# Patient Record
Sex: Male | Born: 1945 | Race: White | Hispanic: No | Marital: Married | Smoking: Former smoker
Health system: Southern US, Community
[De-identification: ages and names within clinical notes are randomized; demographics above are authoritative.]

## PROBLEM LIST (undated history)

## (undated) DIAGNOSIS — K439 Ventral hernia without obstruction or gangrene: Secondary | ICD-10-CM

## (undated) DIAGNOSIS — I779 Disorder of arteries and arterioles, unspecified: Secondary | ICD-10-CM

## (undated) DIAGNOSIS — R0600 Dyspnea, unspecified: Secondary | ICD-10-CM

## (undated) DIAGNOSIS — I444 Left anterior fascicular block: Secondary | ICD-10-CM

## (undated) DIAGNOSIS — J439 Emphysema, unspecified: Secondary | ICD-10-CM

## (undated) DIAGNOSIS — D62 Acute posthemorrhagic anemia: Secondary | ICD-10-CM

## (undated) DIAGNOSIS — Z7982 Long term (current) use of aspirin: Secondary | ICD-10-CM

## (undated) DIAGNOSIS — Z720 Tobacco use: Secondary | ICD-10-CM

## (undated) DIAGNOSIS — Z8709 Personal history of other diseases of the respiratory system: Secondary | ICD-10-CM

## (undated) DIAGNOSIS — I10 Essential (primary) hypertension: Secondary | ICD-10-CM

## (undated) DIAGNOSIS — E785 Hyperlipidemia, unspecified: Secondary | ICD-10-CM

## (undated) DIAGNOSIS — I6789 Other cerebrovascular disease: Secondary | ICD-10-CM

## (undated) DIAGNOSIS — M4802 Spinal stenosis, cervical region: Secondary | ICD-10-CM

## (undated) DIAGNOSIS — C61 Malignant neoplasm of prostate: Secondary | ICD-10-CM

## (undated) DIAGNOSIS — I2510 Atherosclerotic heart disease of native coronary artery without angina pectoris: Secondary | ICD-10-CM

## (undated) DIAGNOSIS — I6381 Other cerebral infarction due to occlusion or stenosis of small artery: Secondary | ICD-10-CM

## (undated) DIAGNOSIS — B0221 Postherpetic geniculate ganglionitis: Secondary | ICD-10-CM

## (undated) DIAGNOSIS — I700 Atherosclerosis of aorta: Secondary | ICD-10-CM

## (undated) DIAGNOSIS — I5189 Other ill-defined heart diseases: Secondary | ICD-10-CM

## (undated) DIAGNOSIS — J449 Chronic obstructive pulmonary disease, unspecified: Secondary | ICD-10-CM

## (undated) DIAGNOSIS — I272 Pulmonary hypertension, unspecified: Secondary | ICD-10-CM

## (undated) DIAGNOSIS — M47812 Spondylosis without myelopathy or radiculopathy, cervical region: Secondary | ICD-10-CM

## (undated) DIAGNOSIS — R918 Other nonspecific abnormal finding of lung field: Secondary | ICD-10-CM

## (undated) DIAGNOSIS — G459 Transient cerebral ischemic attack, unspecified: Secondary | ICD-10-CM

## (undated) DIAGNOSIS — N281 Cyst of kidney, acquired: Secondary | ICD-10-CM

## (undated) DIAGNOSIS — N529 Male erectile dysfunction, unspecified: Secondary | ICD-10-CM

## (undated) DIAGNOSIS — J9600 Acute respiratory failure, unspecified whether with hypoxia or hypercapnia: Secondary | ICD-10-CM

## (undated) DIAGNOSIS — K4090 Unilateral inguinal hernia, without obstruction or gangrene, not specified as recurrent: Secondary | ICD-10-CM

## (undated) DIAGNOSIS — W19XXXA Unspecified fall, initial encounter: Secondary | ICD-10-CM

## (undated) DIAGNOSIS — R0902 Hypoxemia: Secondary | ICD-10-CM

## (undated) HISTORY — DX: Emphysema, unspecified: J43.9

## (undated) HISTORY — PX: FRACTURE SURGERY: SHX138

## (undated) HISTORY — PX: HERNIA REPAIR: SHX51

## (undated) HISTORY — DX: Hypoxemia: R09.02

---

## 2006-10-18 ENCOUNTER — Ambulatory Visit: Payer: Self-pay | Admitting: General Surgery

## 2006-10-18 HISTORY — PX: COLONOSCOPY: SHX174

## 2009-04-27 HISTORY — PX: PROSTATECTOMY: SHX69

## 2009-04-27 HISTORY — PX: PROSTATE SURGERY: SHX751

## 2011-06-09 ENCOUNTER — Ambulatory Visit: Payer: Self-pay | Admitting: General Practice

## 2015-01-23 ENCOUNTER — Ambulatory Visit
Admission: RE | Admit: 2015-01-23 | Discharge: 2015-01-23 | Disposition: A | Discharge status: Home / Self Care | Payer: 59 | Source: Ambulatory Visit | Attending: Internal Medicine | Admitting: Internal Medicine

## 2015-01-23 ENCOUNTER — Other Ambulatory Visit: Payer: Self-pay | Admitting: Internal Medicine

## 2015-01-23 DIAGNOSIS — M25572 Pain in left ankle and joints of left foot: Secondary | ICD-10-CM | POA: Insufficient documentation

## 2015-11-29 ENCOUNTER — Encounter
Admission: EM | Disposition: A | Discharge status: Home / Self Care | Payer: Self-pay | Source: Home / Self Care | Attending: Surgery

## 2015-11-29 ENCOUNTER — Emergency Department: Payer: Medicare Other | Admitting: Anesthesiology

## 2015-11-29 ENCOUNTER — Inpatient Hospital Stay
Admission: EM | Admit: 2015-11-29 | Discharge: 2015-11-30 | DRG: 494 | Disposition: A | Discharge status: Home / Self Care | Payer: Medicare Other | Attending: Surgery | Admitting: Surgery

## 2015-11-29 ENCOUNTER — Emergency Department: Payer: Medicare Other

## 2015-11-29 DIAGNOSIS — Z8546 Personal history of malignant neoplasm of prostate: Secondary | ICD-10-CM

## 2015-11-29 DIAGNOSIS — Y93K1 Activity, walking an animal: Secondary | ICD-10-CM

## 2015-11-29 DIAGNOSIS — Z7982 Long term (current) use of aspirin: Secondary | ICD-10-CM | POA: Diagnosis not present

## 2015-11-29 DIAGNOSIS — M25562 Pain in left knee: Secondary | ICD-10-CM | POA: Diagnosis present

## 2015-11-29 DIAGNOSIS — S82142A Displaced bicondylar fracture of left tibia, initial encounter for closed fracture: Principal | ICD-10-CM | POA: Diagnosis present

## 2015-11-29 DIAGNOSIS — W010XXA Fall on same level from slipping, tripping and stumbling without subsequent striking against object, initial encounter: Secondary | ICD-10-CM | POA: Diagnosis present

## 2015-11-29 DIAGNOSIS — Y92830 Public park as the place of occurrence of the external cause: Secondary | ICD-10-CM | POA: Diagnosis not present

## 2015-11-29 DIAGNOSIS — F17210 Nicotine dependence, cigarettes, uncomplicated: Secondary | ICD-10-CM | POA: Diagnosis present

## 2015-11-29 DIAGNOSIS — T148XXA Other injury of unspecified body region, initial encounter: Secondary | ICD-10-CM

## 2015-11-29 HISTORY — PX: ORIF TIBIA PLATEAU: SHX2132

## 2015-11-29 HISTORY — DX: Malignant neoplasm of prostate: C61

## 2015-11-29 LAB — BASIC METABOLIC PANEL
Anion gap: 11 (ref 5–15)
BUN: 12 mg/dL (ref 6–20)
CHLORIDE: 98 mmol/L — AB (ref 101–111)
CO2: 22 mmol/L (ref 22–32)
CREATININE: 0.74 mg/dL (ref 0.61–1.24)
Calcium: 9.2 mg/dL (ref 8.9–10.3)
GFR calc Af Amer: >60 mL/min (ref >60)
GFR calc non Af Amer: >60 mL/min (ref >60)
GLUCOSE: 106 mg/dL — AB (ref 65–99)
POTASSIUM: 3.8 mmol/L (ref 3.5–5.1)
Sodium: 131 mmol/L — ABNORMAL LOW (ref 135–145)

## 2015-11-29 LAB — CBC WITH DIFFERENTIAL/PLATELET
Basophils Absolute: 0.1 10*3/uL (ref 0–0.1)
Basophils Relative: 1 %
EOS ABS: 0.2 10*3/uL (ref 0–0.7)
EOS PCT: 2 %
HCT: 43.1 % (ref 40.0–52.0)
HEMOGLOBIN: 15.3 g/dL (ref 13.0–18.0)
LYMPHS ABS: 2.2 10*3/uL (ref 1.0–3.6)
LYMPHS PCT: 22 %
MCH: 34.2 pg — AB (ref 26.0–34.0)
MCHC: 35.6 g/dL (ref 32.0–36.0)
MCV: 96 fL (ref 80.0–100.0)
MONOS PCT: 8 %
Monocytes Absolute: 0.8 10*3/uL (ref 0.2–1.0)
Neutro Abs: 6.7 10*3/uL — ABNORMAL HIGH (ref 1.4–6.5)
Neutrophils Relative %: 67 %
PLATELETS: 174 10*3/uL (ref 150–440)
RBC: 4.49 MIL/uL (ref 4.40–5.90)
RDW: 13.2 % (ref 11.5–14.5)
WBC: 9.9 10*3/uL (ref 3.8–10.6)

## 2015-11-29 LAB — TYPE AND SCREEN
ABO/RH(D): A POS
ANTIBODY SCREEN: NEGATIVE

## 2015-11-29 LAB — PROTIME-INR
INR: 1.07
Prothrombin Time: 13.9 seconds (ref 11.4–15.2)

## 2015-11-29 SURGERY — OPEN REDUCTION INTERNAL FIXATION (ORIF) TIBIAL PLATEAU
Anesthesia: Spinal | Site: Leg Lower | Laterality: Left | Wound class: Clean

## 2015-11-29 MED ORDER — ONDANSETRON HCL 4 MG/2ML IJ SOLN: 4.0000 mg | Freq: Once | INTRAMUSCULAR | Status: DC | PRN

## 2015-11-29 MED ORDER — DOCUSATE SODIUM 100 MG PO CAPS
100.0000 mg | ORAL_CAPSULE | Freq: Two times a day (BID) | ORAL | Status: DC
  Administered 2015-11-30: 100 mg via ORAL
  Filled 2015-11-29 (×2): qty 1

## 2015-11-29 MED ORDER — DIPHENHYDRAMINE HCL 12.5 MG/5ML PO ELIX: 12.5000 mg | ORAL_SOLUTION | ORAL | Status: DC | PRN

## 2015-11-29 MED ORDER — PHENYLEPHRINE HCL 10 MG/ML IJ SOLN
INTRAMUSCULAR | Status: DC | PRN
  Administered 2015-11-29: 100 ug via INTRAVENOUS

## 2015-11-29 MED ORDER — HYDROCODONE-ACETAMINOPHEN 5-325 MG PO TABS
2.0000 | ORAL_TABLET | Freq: Once | ORAL | Status: AC
Start: 2015-11-29 — End: 2015-11-29
  Administered 2015-11-29: 2 via ORAL
  Filled 2015-11-29: qty 2

## 2015-11-29 MED ORDER — KCL IN DEXTROSE-NACL 20-5-0.9 MEQ/L-%-% IV SOLN
INTRAVENOUS | Status: DC
  Administered 2015-11-29: 22:00:00 via INTRAVENOUS
  Filled 2015-11-29 (×5): qty 1000

## 2015-11-29 MED ORDER — CEFAZOLIN SODIUM-DEXTROSE 2-4 GM/100ML-% IV SOLN
2.0000 g | Freq: Four times a day (QID) | INTRAVENOUS | Status: AC
  Administered 2015-11-29 – 2015-11-30 (×3): 2 g via INTRAVENOUS
  Filled 2015-11-29 (×3): qty 100

## 2015-11-29 MED ORDER — BUPIVACAINE-EPINEPHRINE (PF) 0.5% -1:200000 IJ SOLN
INTRAMUSCULAR | Status: DC | PRN
  Administered 2015-11-29: 20 mL via PERINEURAL

## 2015-11-29 MED ORDER — MIDAZOLAM HCL 5 MG/5ML IJ SOLN
INTRAMUSCULAR | Status: DC | PRN
  Administered 2015-11-29: 0.5 mg via INTRAVENOUS
  Administered 2015-11-29: 1 mg via INTRAVENOUS
  Administered 2015-11-29: 0.5 mg via INTRAVENOUS

## 2015-11-29 MED ORDER — METOCLOPRAMIDE HCL 10 MG PO TABS: 5.0000 mg | ORAL_TABLET | Freq: Three times a day (TID) | ORAL | Status: DC | PRN

## 2015-11-29 MED ORDER — METOCLOPRAMIDE HCL 5 MG/ML IJ SOLN: 5.0000 mg | Freq: Three times a day (TID) | INTRAMUSCULAR | Status: DC | PRN

## 2015-11-29 MED ORDER — ACETAMINOPHEN 500 MG PO TABS
1000.0000 mg | ORAL_TABLET | Freq: Four times a day (QID) | ORAL | Status: DC
  Administered 2015-11-29 – 2015-11-30 (×2): 1000 mg via ORAL
  Filled 2015-11-29 (×2): qty 2

## 2015-11-29 MED ORDER — OXYCODONE HCL 5 MG PO TABS: 5.0000 mg | ORAL_TABLET | ORAL | Status: DC | PRN

## 2015-11-29 MED ORDER — HYDROMORPHONE HCL 1 MG/ML IJ SOLN: 1.0000 mg | INTRAMUSCULAR | Status: DC | PRN

## 2015-11-29 MED ORDER — NEOMYCIN-POLYMYXIN B GU 40-200000 IR SOLN
Status: DC | PRN
  Administered 2015-11-29: 2 mL

## 2015-11-29 MED ORDER — LACTATED RINGERS IV SOLN
INTRAVENOUS | Status: DC | PRN
  Administered 2015-11-29: 17:00:00 via INTRAVENOUS

## 2015-11-29 MED ORDER — ONDANSETRON HCL 4 MG PO TABS: 4.0000 mg | ORAL_TABLET | Freq: Four times a day (QID) | ORAL | Status: DC | PRN

## 2015-11-29 MED ORDER — ACETAMINOPHEN 325 MG PO TABS: 650.0000 mg | ORAL_TABLET | Freq: Four times a day (QID) | ORAL | Status: DC | PRN

## 2015-11-29 MED ORDER — ONDANSETRON HCL 4 MG/2ML IJ SOLN: 4.0000 mg | Freq: Four times a day (QID) | INTRAMUSCULAR | Status: DC | PRN

## 2015-11-29 MED ORDER — KETOROLAC TROMETHAMINE 15 MG/ML IJ SOLN
15.0000 mg | Freq: Four times a day (QID) | INTRAMUSCULAR | Status: DC
  Administered 2015-11-29 – 2015-11-30 (×2): 15 mg via INTRAVENOUS
  Filled 2015-11-29 (×2): qty 1

## 2015-11-29 MED ORDER — TETRACAINE HCL 1 % IJ SOLN
INTRAMUSCULAR | Status: DC | PRN
  Administered 2015-11-29: 4 mg via INTRASPINAL

## 2015-11-29 MED ORDER — SODIUM CHLORIDE 0.9 % IV BOLUS (SEPSIS)
1000.0000 mL | Freq: Once | INTRAVENOUS | Status: AC
  Administered 2015-11-29: 1000 mL via INTRAVENOUS

## 2015-11-29 MED ORDER — PANTOPRAZOLE SODIUM 40 MG PO TBEC
40.0000 mg | DELAYED_RELEASE_TABLET | Freq: Every day | ORAL | Status: DC
  Administered 2015-11-29: 40 mg via ORAL
  Filled 2015-11-29 (×2): qty 1

## 2015-11-29 MED ORDER — FLEET ENEMA 7-19 GM/118ML RE ENEM: 1.0000 | ENEMA | Freq: Once | RECTAL | Status: DC | PRN

## 2015-11-29 MED ORDER — ENOXAPARIN SODIUM 40 MG/0.4ML ~~LOC~~ SOLN
40.0000 mg | SUBCUTANEOUS | Status: DC
  Administered 2015-11-30: 40 mg via SUBCUTANEOUS
  Filled 2015-11-29: qty 0.4

## 2015-11-29 MED ORDER — KETAMINE HCL 50 MG/ML IJ SOLN
INTRAMUSCULAR | Status: DC | PRN
Start: 2015-11-29 — End: 2015-11-29
  Administered 2015-11-29: 25 mg via INTRAMUSCULAR

## 2015-11-29 MED ORDER — CEFAZOLIN SODIUM 1 G IJ SOLR
INTRAMUSCULAR | Status: DC | PRN
  Administered 2015-11-29: 2 g

## 2015-11-29 MED ORDER — FENTANYL CITRATE (PF) 100 MCG/2ML IJ SOLN: 25.0000 ug | INTRAMUSCULAR | Status: DC | PRN

## 2015-11-29 MED ORDER — ACETAMINOPHEN 650 MG RE SUPP: 650.0000 mg | Freq: Four times a day (QID) | RECTAL | Status: DC | PRN

## 2015-11-29 MED ORDER — CLOBETASOL PROPIONATE 0.05 % EX CREA
1.0000 "application " | TOPICAL_CREAM | Freq: Two times a day (BID) | CUTANEOUS | Status: DC
  Filled 2015-11-29: qty 15

## 2015-11-29 MED ORDER — MORPHINE SULFATE (PF) 4 MG/ML IV SOLN
4.0000 mg | Freq: Once | INTRAVENOUS | Status: DC
  Filled 2015-11-29: qty 1

## 2015-11-29 MED ORDER — MAGNESIUM HYDROXIDE 400 MG/5ML PO SUSP: 30.0000 mL | Freq: Every day | ORAL | Status: DC | PRN

## 2015-11-29 MED ORDER — BISACODYL 10 MG RE SUPP: 10.0000 mg | Freq: Every day | RECTAL | Status: DC | PRN

## 2015-11-29 MED ORDER — KETOROLAC TROMETHAMINE 15 MG/ML IJ SOLN
30.0000 mg | Freq: Once | INTRAMUSCULAR | Status: DC
  Filled 2015-11-29: qty 2

## 2015-11-29 MED ORDER — BUPIVACAINE HCL (PF) 0.5 % IJ SOLN
INTRAMUSCULAR | Status: DC | PRN
  Administered 2015-11-29: 2.8 mL

## 2015-11-29 MED ORDER — PROPOFOL 500 MG/50ML IV EMUL
INTRAVENOUS | Status: DC | PRN
  Administered 2015-11-29: 50 ug/kg/min via INTRAVENOUS

## 2015-11-29 SURGICAL SUPPLY — 60 items
BANDAGE ACE 6X5 VEL STRL LF (GAUZE/BANDAGES/DRESSINGS) ×3 IMPLANT
BIT DRILL 100X2.5XANTM LCK (BIT) ×1 IMPLANT
BIT DRILL CAL (BIT) ×1 IMPLANT
BIT DRL 100X2.5XANTM LCK (BIT) ×1
BNDG GAUZE 4.5X4.1 6PLY STRL (MISCELLANEOUS) ×3 IMPLANT
BONE CANC CHIPS 40CC CAN1/2 (Bone Implant) ×3 IMPLANT
BRACE KNEE POST OP SHORT (BRACE) ×3 IMPLANT
CANISTER SUCT 1200ML W/VALVE (MISCELLANEOUS) ×3 IMPLANT
CATH TRAY 16F METER LATEX (MISCELLANEOUS) ×3 IMPLANT
CHIPS CANC BONE 40CC CAN1/2 (Bone Implant) ×1 IMPLANT
CHLORAPREP W/TINT 26ML (MISCELLANEOUS) ×3 IMPLANT
COOLER POLAR GLACIER W/PUMP (MISCELLANEOUS) ×3 IMPLANT
CUFF TOURN 24 STER (MISCELLANEOUS) IMPLANT
CUFF TOURN 30 STER DUAL PORT (MISCELLANEOUS) IMPLANT
DISTAL HUMERAL MEDIAL RT SM (Screw) ×3 IMPLANT
DRAPE C-ARM XRAY 36X54 (DRAPES) ×3 IMPLANT
DRAPE C-ARMOR (DRAPES) ×3 IMPLANT
DRAPE SHEET LG 3/4 BI-LAMINATE (DRAPES) ×3 IMPLANT
DRAPE TABLE BACK 80X90 (DRAPES) ×3 IMPLANT
DRILL BIT 2.5MM (BIT) ×2
DRILL BIT CAL (BIT) ×3
ELECT CAUTERY BLADE 6.4 (BLADE) ×3 IMPLANT
ELECT REM PT RETURN 9FT ADLT (ELECTROSURGICAL) ×3
ELECTRODE REM PT RTRN 9FT ADLT (ELECTROSURGICAL) ×1 IMPLANT
GAUZE PETRO XEROFOAM 1X8 (MISCELLANEOUS) ×3 IMPLANT
GAUZE SPONGE 4X4 12PLY STRL (GAUZE/BANDAGES/DRESSINGS) ×3 IMPLANT
GLOVE SURG ORTHO 9.0 STRL STRW (GLOVE) ×3 IMPLANT
GOWN SRG 2XL LVL 4 RGLN SLV (GOWNS) ×1 IMPLANT
GOWN STRL NON-REIN 2XL LVL4 (GOWNS) ×2
GOWN STRL REUS W/ TWL LRG LVL3 (GOWN DISPOSABLE) ×1 IMPLANT
GOWN STRL REUS W/TWL LRG LVL3 (GOWN DISPOSABLE) ×2
K-WIRE ACE 1.6X6 (WIRE) ×9
KIT RM TURNOVER STRD PROC AR (KITS) ×3 IMPLANT
KWIRE ACE 1.6X6 (WIRE) ×3 IMPLANT
NS IRRIG 1000ML POUR BTL (IV SOLUTION) ×3 IMPLANT
PACK TOTAL KNEE (MISCELLANEOUS) ×3 IMPLANT
PAD ABD DERMACEA PRESS 5X9 (GAUZE/BANDAGES/DRESSINGS) ×3 IMPLANT
PAD PREP 24X41 OB/GYN DISP (PERSONAL CARE ITEMS) ×3 IMPLANT
PAD WRAPON POLAR KNEE (MISCELLANEOUS) ×1 IMPLANT
PLATE LOCK 5H STD LT PROX TIB (Plate) ×3 IMPLANT
SCREW CORTICAL 3.5MM  42MM (Screw) ×2 IMPLANT
SCREW CORTICAL 3.5MM  44MM (Screw) ×2 IMPLANT
SCREW CORTICAL 3.5MM 42MM (Screw) ×1 IMPLANT
SCREW CORTICAL 3.5MM 44MM (Screw) ×1 IMPLANT
SCREW CORTICAL 3.5MM 48MM (Screw) ×3 IMPLANT
SCREW CORTICAL 3.5MM 50MM (Screw) ×3 IMPLANT
SCREW LOCK CANC STAR 4X75 (Screw) ×6 IMPLANT
SCREW LOCK CANC STAR 4X85 (Screw) ×3 IMPLANT
SCREW LOCK CANC STAR 4X90 (Screw) ×6 IMPLANT
SCREW LOCK CANC STAR 4X95 (Screw) ×3 IMPLANT
SCREW LOCK CORT STAR 3.5X70 (Screw) ×3 IMPLANT
SCREW LP 3.5X95MM (Screw) ×3 IMPLANT
STAPLER SKIN PROX 35W (STAPLE) ×3 IMPLANT
SUT VIC AB 0 CT1 27 (SUTURE) ×2
SUT VIC AB 0 CT1 27XCR 8 STRN (SUTURE) ×1 IMPLANT
SUT VIC AB 0 CT1 36 (SUTURE) ×3 IMPLANT
SUT VIC AB 1 CT1 36 (SUTURE) ×3 IMPLANT
SUT VIC AB 2-0 CT1 27 (SUTURE) ×2
SUT VIC AB 2-0 CT1 TAPERPNT 27 (SUTURE) ×1 IMPLANT
WRAPON POLAR PAD KNEE (MISCELLANEOUS) ×3

## 2015-11-29 NOTE — ED Triage Notes (Signed)
Pt came to ED via EMS after fall. Pt reports dog dragged him down a few steps. C/o left knee pain. Knee swollen.

## 2015-11-29 NOTE — Anesthesia Procedure Notes (Signed)
Spinal  Patient location during procedure: OR Start time: 11/29/2015 5:04 PM End time: 11/29/2015 5:08 PM Staffing Anesthesiologist: Alvin Critchley Performed: anesthesiologist  Preanesthetic Checklist Completed: patient identified, site marked, surgical consent, pre-op evaluation, timeout performed, IV checked, risks and benefits discussed and monitors and equipment checked Spinal Block Patient position: sitting Prep: Betadine and site prepped and draped Patient monitoring: heart rate, cardiac monitor, continuous pulse ox and blood pressure Approach: right paramedian Location: L3-4 Injection technique: single-shot Needle Needle type: Whitacre  Needle gauge: 25 G Assessment Sensory level: T8 Additional Notes Time out called.  Patient placed in sitting position.  Back prepped and draped in sterile fashion.  A skin wheal was made in the right paramedian position along the L3-L4 interspace with 1% Lidocaine plain.  A 25 G whit needle was used to obtain the spinal with clear, colorless CSF in all 4 Quads.  No Blood or paresthesias.  Patient tolerated the procedure well.

## 2015-11-29 NOTE — H&P (Signed)
Subjective:  Chief complaint:  Left knee pain.  The patient is a 70 y.o. male who sustained an injury to the left knee earlier today. Apparently he and his wife are walking his dogs in the local park when the dog pulled suddenly while he was still descending several steps on a bridge. He got his foot caught in a route and twisted awkwardly, injuring his left knee. He was brought to the emergency room where x-rays demonstrated a split compression fracture of the left lateral tibial plateau. The patient denies any associated injury or loss of consciousness associated with the injury, and denies any light-headedness, loss of consciousness, chest pain, or shortness of breath which might have contributed to the injury.  There are no active problems to display for this patient.  Past Medical History:  Diagnosis Date  . Prostate cancer [facility])     Past Surgical History:  Procedure Laterality Date  . PROSTATE SURGERY       (Not in a hospital admission) No Known Allergies  Social History  Substance Use Topics  . Smoking status: Current Every Day Smoker    Packs/day: 1.00    Types: Cigarettes  . Smokeless tobacco: Not on file  . Alcohol use Yes    History reviewed. No pertinent family history.   Review of Systems: As noted above. The patient denies any chest pain, shortness of breath, nausea, vomiting, diarrhea, constipation, belly pain, blood in his/her stool, or burning with urination.  Objective: Temp:  [98.6 F (37 C)] 98.6 F (37 C) (08/04 1231) Pulse Rate:  [68-77] 70 (08/04 1511) Resp:  [16] 16 (08/04 1511) BP: (147-189)/(69-119) 147/84 (08/04 1511) SpO2:  [96 %-98 %] 97 % (08/04 1511) Weight:  [78 kg (172 lb)] 78 kg (172 lb) (08/04 1231)  Physical Exam: General:  Alert, no acute distress Psychiatric:  Patient is competent for consent with normal mood and affect  Cardiovascular:  RRR  Respiratory:  Clear to auscultation. No wheezing. Non-labored breathing GI:  Abdomen is  soft and non-tender Skin:  No lesions in the area of chief complaint Neurologic:  Sensation intact distally Lymphatic:  No axillary or cervical lymphadenopathy  Orthopedic Exam:  Orthopedic examination is limited to the left knee and lower extremity. Skin inspection is notable for moderate swelling around the knee, but there is no evidence for lacerations, abrasions, ecchymosis, or erythema. He has a 2+ effusion. He is able to perform straight leg raise but has pain with any attempted active passive [place] of motion of the knee. He has moderate tenderness to palpation over the lateral aspect of the knee, but no tenderness medially. He is neurovascularly intact to the left foot and lower extremity.  Imaging Review: Recent x-rays of the left knee are available for review.  These films demonstrate a displaced split compression fracture of the lateral tibial plateau with approximate 2 cm of lateral plateau depression. No lytic lesions or other abnormalities are identified. No significant degenerative changes are noted.  Assessment: Displaced split compression fracture lateral tibial plateau left knee.  Plan: The treatment options, including medical management and arthroplasty, have been discussed in detail with the patient and his family. The patient would like to proceed with formal open reduction and internal fixation of the left lateral tibial plateau fracture. The risks (including bleeding, infection, nerve and/or blood vessel injury, persistent or recurrent pain, malunion and nonunion, development of degenerative joint disease, need for further surgery, blood clots, strokes, heart attacks or arrhythmias, pneumonia, etc.) and benefits of  a total hip arthroplasty were discussed. The patient states his understanding and agrees to proceed. He agrees to a blood transfusion if necessary. A formal written consent has been obtained.

## 2015-11-29 NOTE — Transfer of Care (Signed)
Immediate Anesthesia Transfer of Care Note  Patient: Jordan Barber  Procedure(s) Performed: Procedure(s): OPEN REDUCTION INTERNAL FIXATION (ORIF) TIBIAL PLATEAU (Left)  Patient Location: PACU  Anesthesia Type:Spinal  Level of Consciousness: sedated  Airway & Oxygen Therapy: Patient connected to face mask oxygen  Post-op Assessment: Post -op Vital signs reviewed and stable  Post vital signs: stable  Last Vitals:  Vitals:   11/29/15 1511 11/29/15 1938  BP: (!) 147/84 115/69  Pulse: 70 79  Resp: 16 16  Temp:  36.3 C    Last Pain:  Vitals:   11/29/15 1938  TempSrc: Temporal         Complications: No apparent anesthesia complications

## 2015-11-29 NOTE — ED Provider Notes (Signed)
[facility] Provider Note   CSN: [id number] Arrival date & time: 11/29/15  1226  First Provider Contact:  First MD Initiated Contact with Patient 11/29/15 1227        History   Chief Complaint Chief Complaint  Patient presents with  . Fall  . Knee Pain    HPI AUNDRE PEPLOW is a 70 y.o. male hx of prostate cancer in remission, Here presenting with left knee pain. Patient states that he was on the bottom of the stairs and the dog grabbed him down a couple steps and landed on the left foot. He states that his ankle and feet don't hurt but he felt a pop in his left knee and was unable to bear weight afterwards. He states that his knee was very swollen. Denies any other injuries. States that other than prostate cancer in remission he is healthy otherwise. Not taking any medicines currently. Came by EMS.     The history is provided by the patient.    Past Medical History:  Diagnosis Date  . Prostate cancer ([place])     There are no active problems to display for this patient.   Past Surgical History:  Procedure Laterality Date  . PROSTATE SURGERY         Home Medications    Prior to Admission medications   Medication Sig Start Date End Date Taking? Authorizing Provider  aspirin 325 MG tablet Take 325 mg by mouth at bedtime.   Yes Historical Provider, MD  clobetasol (TEMOVATE) 0.05 % external solution Apply 1 application topically 2 (two) times daily.   Yes Historical Provider, MD  clobetasol ointment (TEMOVATE) [id number] % Apply 1 application topically 2 (two) times daily.   Yes Historical Provider, MD    Family History History reviewed. No pertinent family history.  Social History Social History  Substance Use Topics  . Smoking status: Current Every Day Smoker    Packs/day: 1.00    Types: Cigarettes  . Smokeless tobacco: Not on file  . Alcohol use Yes     Allergies   Review of patient's allergies indicates no known allergies.   Review of  Systems Review of Systems  Musculoskeletal:       L knee pain   All other systems reviewed and are negative.    Physical Exam Updated Vital Signs BP (!) 147/84 (BP Location: Right Arm)   Pulse 70   Temp 98.6 F (37 C) (Oral)   Resp 16   Ht 6' (1.829 m)   Wt 172 lb (78 kg)   SpO2 97%   BMI 23.33 kg/m   Physical Exam  Constitutional: He is oriented to person, place, and time.  Uncomfortable   HENT:  Head: Normocephalic.  Eyes: EOM are normal. Pupils are equal, round, and reactive to light.  Neck: Normal Hage of motion.  Cardiovascular: Normal rate and regular rhythm.   Pulmonary/Chest: Effort normal and breath sounds normal.  Abdominal: Soft. Bowel sounds are normal.  Musculoskeletal:  L knee effusion, dec ROM due to pain. ACL/PCL appears grossly intact. No hip or femur tenderness, no tib/fib tenderness or deformity. L ankle not swollen or tender. No foot tenderness or deformity. 2+ pedal pulses, able to wiggles toes   Neurological: He is alert and oriented to person, place, and time.  Skin: Skin is warm.  Psychiatric: He has a normal mood and affect.  Nursing note and vitals reviewed.    ED Treatments / Results  Labs (all labs ordered are listed,  but only abnormal results are displayed) Labs Reviewed  CBC WITH DIFFERENTIAL/PLATELET - Abnormal; Notable for the following:       Result Value   MCH 34.2 (*)    Neutro Abs 6.7 (*)    All other components within normal limits  BASIC METABOLIC PANEL - Abnormal; Notable for the following:    Sodium 131 (*)    Chloride 98 (*)    Glucose, Bld 106 (*)    All other components within normal limits  PROTIME-INR  TYPE AND SCREEN    EKG  EKG Interpretation None      ED ECG REPORT I, Wandra Arthurs, the attending physician, personally viewed and interpreted this ECG.   Date: 11/29/2015  EKG Time: 14:10   Rate: 69  Rhythm: normal EKG, normal sinus rhythm  Axis: normal  Intervals:left anterior fascicular block  ST&T  Change: none   Radiology Ct Knee Left Wo Contrast  Result Date: 11/29/2015 CLINICAL DATA:  A dog fragment patient down steps today resulting in a left knee fracture. Initial encounter. EXAM: CT OF THE LEFT KNEE WITHOUT CONTRAST TECHNIQUE: Multidetector CT imaging of the left knee was performed according to the standard protocol. Multiplanar CT image reconstructions were also generated. COMPARISON:  Plain films left knee this same day. FINDINGS: Bones/Joint/Cartilage As seen the comparison plain films, the patient has a lateral tibial plateau fracture. The fracture extends from just peripheral to the tibial eminences in an inferior and lateral orientation through the metaphysis approximately 6 cm below the joint line. The lateral 1.8 cm of the articular surface of the tibial plateau extending to the metaphysis is a separate, floating fragment and is laterally displaced approximately 2.4 cm. There is a large gap at the articular surface measuring 2.0 cm transverse by approximately 3.6 cm AP. A fragment of plateau which includes 1.4 cm transverse by 2.0 cm AP of the articular surface is depressed approximately 2.2 cm. The anterior cortex of the proximal tibia is distracted up to 0.5 cm. The fracture has a nondisplaced component extending through the medial tibial eminence. No other fracture is identified. Ligaments Suboptimally assessed by CT. The anterior and posterior cruciate ligaments and medial collateral ligament are intact. The iliotibial band is intact. The fibular collateral ligament is not well seen but likely intact. The Muscles and Tendons Unremarkable. Soft tissues Lipohemarthrosis is noted. IMPRESSION: Impacted and displaced lateral tibial plateau fracture as described above with associated lipohemarthrosis. Electronically Signed   By: Inge Rise M.D.   On: 11/29/2015 14:57   Dg Knee Complete 4 Views Left  Result Date: 11/29/2015 CLINICAL DATA:  Pain following fall EXAM: LEFT KNEE - COMPLETE  4+ VIEW COMPARISON:  None. FINDINGS: Frontal, lateral, tunnel, and oblique views were obtained. There is a fracture of the lateral tibial plateau with depression of the lateral tibial plateau and multiple displaced fracture fragments. Fractures extend into the medial and lateral tibial eminence regions. There is no appreciable dislocation. There is a fat -fluid level in the suprapatellar bursa consistent with the fracture. There is no appreciable joint space narrowing. IMPRESSION: Comminuted fracture of the proximal lateral tibia with tibial plateau depression and displacement of fracture fragments. There are fractures in each tibial eminence. There is a sizable joint effusion with fat -fluid level indicative of the fracture. No frank dislocation is evident. No significant arthropathy. Electronically Signed   By: Lowella Grip III M.D.   On: 11/29/2015 13:24    Procedures Procedures (including critical care time)  Medications Ordered  in ED Medications  morphine 4 MG/ML injection 4 mg (4 mg Intravenous Refused 11/29/15 1416)  HYDROcodone-acetaminophen (NORCO/VICODIN) 5-325 MG per tablet 2 tablet (2 tablets Oral Given 11/29/15 1235)  sodium chloride 0.9 % bolus 1,000 mL (1,000 mLs Intravenous New Bag/Given 11/29/15 1352)     Initial Impression / Assessment and Plan / ED Course  I have reviewed the triage vital signs and the nursing notes.  Pertinent labs & imaging results that were available during my care of the patient were reviewed by me and considered in my medical decision making (see chart for details).  Clinical Course    Barnabas Billock Villamor is a 70 y.o. male here with L knee swelling after injury. No other injuries. Will get xrays and give pain meds.   1:40 pm Xray showed complex tibial plateau fracture. Consulted Dr. Roland Rack, who plans to operate on patient. Request preop labs, CT knee. Will keep NPO.   3:16 PM Preop labs unremarkable. Dr. Roland Rack to admit.   Final Clinical Impressions(s) / ED  Diagnoses   Final diagnoses:  Tibial plateau fracture, left, closed, initial encounter    New Prescriptions New Prescriptions   No medications on file     Drenda Freeze, MD 11/29/15 1516

## 2015-11-29 NOTE — Anesthesia Preprocedure Evaluation (Addendum)
Anesthesia Evaluation  Patient identified by MRN, date of birth, ID band Patient awake    Reviewed: Allergy & Precautions, NPO status , Patient's Chart, lab work & pertinent test results  Airway Mallampati: II       Dental  (+) Caps, Chipped   Pulmonary Current Smoker,    Pulmonary exam normal        Cardiovascular negative cardio ROS Normal cardiovascular exam     Neuro/Psych negative neurological ROS  negative psych ROS   GI/Hepatic negative GI ROS, Neg liver ROS,   Endo/Other  negative endocrine ROS  Renal/GU negative Renal ROS     Musculoskeletal negative musculoskeletal ROS (+)   Abdominal Normal abdominal exam  (+)   Peds negative pediatric ROS (+)  Hematology negative hematology ROS (+)   Anesthesia Other Findings Prostate CA  Reproductive/Obstetrics                            Anesthesia Physical Anesthesia Plan  ASA: II  Anesthesia Plan:    Post-op Pain Management:    Induction: Intravenous  Airway Management Planned:   Additional Equipment:   Intra-op Plan:   Post-operative Plan:   Informed Consent:   Dental advisory given  Plan Discussed with: CRNA and Surgeon  Anesthesia Plan Comments:         Anesthesia Quick Evaluation                                   Anesthesia Evaluation  Patient identified by MRN, date of birth, ID band Patient awake    Reviewed: Allergy & Precautions, NPO status , Patient's Chart, lab work & pertinent test results  Airway        Dental   Pulmonary Current Smoker,           Cardiovascular negative cardio ROS       Neuro/Psych negative neurological ROS  negative psych ROS   GI/Hepatic negative GI ROS, Neg liver ROS,   Endo/Other  negative endocrine ROS  Renal/GU negative Renal ROS     Musculoskeletal negative musculoskeletal ROS (+)   Abdominal   Peds negative pediatric ROS (+)   Hematology negative hematology ROS (+)   Anesthesia Other Findings Prostate CA  Reproductive/Obstetrics                             Anesthesia Physical Anesthesia Plan  ASA: II  Anesthesia Plan:    Post-op Pain Management:    Induction: Intravenous  Airway Management Planned:   Additional Equipment:   Intra-op Plan:   Post-operative Plan:   Informed Consent:   Dental advisory given  Plan Discussed with: CRNA and Surgeon  Anesthesia Plan Comments:         Anesthesia Quick Evaluation

## 2015-11-29 NOTE — Op Note (Signed)
11/29/2015  7:37 PM  Patient:   Jordan Barber  Pre-Op Diagnosis:   Displaced comminuted split compression fracture, left lateral tibial plateau.  Post-Op Diagnosis:   Same.  Procedure:   Open reduction and internal fixation of displaced split compression fracture, left lateral tibial plateau.  Surgeon:   Pascal Lux, MD  Assistant:   None  Anesthesia:   Spinal  Findings:   As above.  Complications:   None  EBL:   10 cc  Fluids:   800 cc crystalloid  TT:   110 minutes at 300 mmHg  Drains:   None  Closure:   Staples  Implants:   Biomet 5-hole precontoured lateral tibial plateau plate  Brief Clinical Note:   The patient is a 70 year old male who sustained the above-noted injury earlier today when he stumbled down several steps while out walking his dog in a local park. He was brought to the emergency room where x-rays demonstrated the above-noted injury. He presents at this time for definitive management of his injury.  Procedure:   The patient was brought into the operating room. After adequate spinal anesthesia was obtained, the patient was lain in the supine position. The left lower extremity was prepped with ChloraPrep solution before being draped sterilely. Preoperative antibiotics were administered. A timeout was obtained to verify the appropriate surgical site before the limb was exsanguinated with an Esmarch and the tourniquet inflated to 300 mmHg. An approximately 10 cm curvilinear incision was made over the lateral aspect of the tibial plateau. The incision was carried down through the subcutaneous tissues to expose the iliotibial band proximally and the fascia overlying the anterior compartment distally. The structures were released in line with the incision and the anterior compartment musculature elevated subperiosteally. Proximally, the tissues were elevated off the lateral aspect of the tibial plateau in order to identify the joint line. The lateral meniscus was  elevated by releasing the tibial attachment anteriorly and laterally in order to visualize the articular surface better.   The fracture was opened anteriorly. The large fragments containing articular cartilage were identified, elevated, and repositioned. The largest fragment was temporary secured to the lateral fragment using a K wire. Approximately 20 cc of cancellus chips were impacted into the fracture beneath the articular fragments in order to better support them. The lateral piece was then reapproximated/reduced and secured temporally secured using a large bone-holding tenaculum. The adequacy of fracture reduction was verified fluoroscopically in AP and lateral projections and found to be excellent. The appropriate sized plate was selected and placed against the lateral tibial cortex and positioned appropriately before being secured temporarily with a K wire proximally. Again the adequacy of plate position and fracture overall fracture reduction was verified fluoroscopically in AP and lateral projections before the plate was secured using 3 nonlocking cortical screws distally, 1 nonlocking cortical screw proximally, and 6 additional locking 4.0 mm cancellus screws proximally. Finally, one 3.5 mm "kick stand" screw was placed for additional support. The final construct was assessed fluoroscopically in AP and lateral projections and found to be near anatomic with excellent reduction of the fracture and excellent position of the hardware.  The wound was copiously irrigated with bacitracin saline solution using bulb irrigation before the fascial layer was reapproximated using #0 Vicryl interrupted sutures. Care also was taken to repair the inferior margin of the lateral meniscus. The subcutaneous tissues were closed in two layers using 2-0 Vicryl interrupted sutures before the skin was closed using staples. A total of  20 cc of 0.5% Sensorcaine with epinephrine was injected in and around the incision site to  help with postoperative analgesia. A sterile bulky dressing was applied to the knee, incorporating an electric cool pad. The patient's leg was placed into a hinged knee brace with the hinges set at 0-90 but locked in extension. The patient was then awakened and returned to the recovery room in satisfactory condition after tolerating the procedure well.

## 2015-11-30 LAB — BASIC METABOLIC PANEL
Anion gap: 8 (ref 5–15)
BUN: 10 mg/dL (ref 6–20)
CALCIUM: 8.2 mg/dL — AB (ref 8.9–10.3)
CHLORIDE: 101 mmol/L (ref 101–111)
CO2: 24 mmol/L (ref 22–32)
CREATININE: 0.63 mg/dL (ref 0.61–1.24)
GFR calc Af Amer: >60 mL/min (ref >60)
GFR calc non Af Amer: >60 mL/min (ref >60)
GLUCOSE: 151 mg/dL — AB (ref 65–99)
Potassium: 3.5 mmol/L (ref 3.5–5.1)
Sodium: 133 mmol/L — ABNORMAL LOW (ref 135–145)

## 2015-11-30 LAB — CBC WITH DIFFERENTIAL/PLATELET
Basophils Absolute: 0.2 10*3/uL — ABNORMAL HIGH (ref 0–0.1)
Basophils Relative: 2 %
Eosinophils Absolute: 0.2 10*3/uL (ref 0–0.7)
Eosinophils Relative: 2 %
HEMATOCRIT: 37.3 % — AB (ref 40.0–52.0)
HEMOGLOBIN: 13.1 g/dL (ref 13.0–18.0)
LYMPHS ABS: 1.1 10*3/uL (ref 1.0–3.6)
Lymphocytes Relative: 10 %
MCH: 34.5 pg — AB (ref 26.0–34.0)
MCHC: 35 g/dL (ref 32.0–36.0)
MCV: 98.5 fL (ref 80.0–100.0)
MONO ABS: 0.8 10*3/uL (ref 0.2–1.0)
MONOS PCT: 7 %
NEUTROS ABS: 8.7 10*3/uL — AB (ref 1.4–6.5)
NEUTROS PCT: 79 %
Platelets: 145 10*3/uL — ABNORMAL LOW (ref 150–440)
RBC: 3.78 MIL/uL — ABNORMAL LOW (ref 4.40–5.90)
RDW: 13.6 % (ref 11.5–14.5)
WBC: 11 10*3/uL — ABNORMAL HIGH (ref 3.8–10.6)

## 2015-11-30 MED ORDER — OXYCODONE HCL 5 MG PO TABS: 5.0000 mg | ORAL_TABLET | ORAL | 0 refills | Status: DC | PRN

## 2015-11-30 NOTE — Progress Notes (Signed)
Discharge instructions/prescriptions given; acknowledged understanding.

## 2015-11-30 NOTE — Discharge Instructions (Signed)
-  Remain non-weightbearing to the left leg -Can work on gentle ROM from 0-45 degrees flexion -Oxycodone as needed for pain -Follow-up with [facility] in 10-14 days. -Keep left knee dressing dry and intact.

## 2015-11-30 NOTE — Progress Notes (Signed)
Pt discharged home instructions given by charge nurse April Rast. Dressing changed per PA instructions pt tolerated well.

## 2015-11-30 NOTE — Progress Notes (Signed)
CSW was consulted for SNF placement. Per MD [facility] patient is to discharge home today.There are no other CSW needs at this time. CSW is available if a need were to arise. CSW is signing off.  Ernest Pine, MSW, LCSW, [place] Clinical Social Worker [phone]

## 2015-11-30 NOTE — Progress Notes (Signed)
  Subjective: 1 Day Post-Op Procedure(s) (LRB): OPEN REDUCTION INTERNAL FIXATION (ORIF) TIBIAL PLATEAU (Left) Patient reports pain as mild.   Patient is well, and has had no acute complaints or problems Plan is to go Home after hospital stay. Negative for chest pain and shortness of breath Fever: no Gastrointestinal:negative for nausea and vomiting  Objective: Vital signs in last 24 hours: Temp:  [97 F (36.1 C)-98.6 F (37 C)] 97.9 F (36.6 C) (08/05 0321) Pulse Rate:  [46-79] 68 (08/05 0321) Resp:  [15-20] 16 (08/05 0321) BP: (109-189)/(61-119) 130/71 (08/05 0321) SpO2:  [93 %-100 %] 100 % (08/05 0321) Weight:  [78 kg (172 lb)] 78 kg (172 lb) (08/04 1231)  Intake/Output from previous day:  Intake/Output Summary (Last 24 hours) at 11/30/15 0721 Last data filed at 11/30/15 0400  Gross per 24 hour  Intake          1953.33 ml  Output              410 ml  Net          1543.33 ml    Intake/Output this shift: No intake/output data recorded.  Labs:  Recent Labs  11/29/15 1345 11/30/15 0414  HGB 15.3 13.1    Recent Labs  11/29/15 1345 11/30/15 0414  WBC 9.9 11.0*  RBC 4.49 3.78*  HCT 43.1 37.3*  PLT 174 145*    Recent Labs  11/29/15 1345 11/30/15 0414  NA 131* 133*  K 3.8 3.5  CL 98* 101  CO2 22 24  BUN 12 10  CREATININE 0.74 0.63  GLUCOSE 106* 151*  CALCIUM 9.2 8.2*    Recent Labs  11/29/15 1345  INR 1.07    EXAM General - Patient is Alert, Appropriate and Oriented Extremity - ABD soft Sensation intact distally Intact pulses distally Incision: dressing C/D/I No cellulitis present Dressing/Incision - clean, dry, no drainage Motor Function - intact, moving foot and toes well on exam.   Negative Homan's to bilateral lower extremities. ROM brace intact and locked in extension.  Past Medical History:  Diagnosis Date  . Prostate cancer ([place])     Assessment/Plan: 1 Day Post-Op Procedure(s) (LRB): OPEN REDUCTION INTERNAL FIXATION (ORIF)  TIBIAL PLATEAU (Left) Active Problems:   Tibial plateau fracture, left  Estimated body mass index is 23.33 kg/m as calculated from the following:   Height as of this encounter: 6' (1.829 m).   Weight as of this encounter: 78 kg (172 lb). Advance diet Up with therapy D/C IV fluids when tolerating PO intake.  Na 133, d/c IVF today.  Hg 13.1 this AM. CBC and BMP ordered for tomorrow. Up with PT today, Dr. Roland Rack has ordered non-weightbearing to the left lower extremity. Plan will be for discharge home possibly tomorrow pending PT sessions today.  If PT thinks he is stable for discharge home today, can discharge this evening.  DVT Prophylaxis - Lovenox and Foot Pumps Non-weightbearing to left leg  J. Cameron Proud, PA-C [facility] Orthopaedic Surgery 11/30/2015, 7:21 AM

## 2015-11-30 NOTE — Care Management Note (Signed)
Case Management Note  Patient Details  Name: Jordan Barber MRN: [id number] Date of Birth: Jun 18, 1945  Subjective/Objective:    A referral for HH=RW and BSC was faxed to [facility] today at [place]. This writer called at patient's request to check on time of delivery of DME equipment and was told by [facility] that the patient could expect delivery today between 1pm and 4pm.                 Action/Plan:   Expected Discharge Date:                  Expected Discharge Plan:     In-House Referral:     Discharge planning Services     Post Acute Care Choice:    Choice offered to:     DME Arranged:    DME Agency:     HH Arranged:    [place] Agency:     Status of Service:     If discussed at [employer], dates discussed:    Additional Comments:  Richetta Cubillos A, RN 11/30/2015, 12:49 PM

## 2015-11-30 NOTE — Care Management Note (Addendum)
Case Management Note  Patient Details  Name: Jordan Barber MRN: [id number] Date of Birth: 1945-10-11  Subjective/Objective:    Discussed discharge planning with [name]. He chose [name] to be his home health provider. A referral was called to [name] at [facility] for home health PT. A referral for a RW and a BSC was faxed to [facility] with a request for delivery to [facility] room 145 today.Discussed need for delivery of DME to [facility]-room 145 today because [name] is being discharged today with Anne Ng at [facility].Marland Kitchen                  Action/Plan:   Expected Discharge Date:                  Expected Discharge Plan:     In-House Referral:     Discharge planning Services     Post Acute Care Choice:    Choice offered to:     DME Arranged:    DME Agency:     HH Arranged:    HH Agency:     Status of Service:     If discussed at [employer], dates discussed:    Additional Comments:  Zamire Whitehurst A, RN 11/30/2015, 9:04 AM

## 2015-11-30 NOTE — Evaluation (Signed)
Physical Therapy Evaluation Patient Details Name: Jordan Barber MRN: [id number] DOB: [birth year] Today's Date: 11/30/2015   History of Present Illness  70 y/o male who was walking a dog and got pulled down steps.  He suffered a L tibeal plateau fx and needed an ORIF.  Clinical Impression  Pt generally did well with PT and was able to get to EOB, standing and ambulate (with walker maintaining NWBing) with good relative confidence and safety.  He was able to go up the steps backward and though he has some fatigue with all the effort her ultimately did well. Pt is eager to go home and should be able to do so safely with assist from family.    Follow Up Recommendations Home health PT    Equipment Recommendations  Rolling walker with 5" wheels (shower seat)    Recommendations for Other Services       Precautions / Restrictions Precautions Precautions: Knee Required Braces or Orthoses: Knee Immobilizer - Left Knee Immobilizer - Left: On at all times Restrictions Weight Bearing Restrictions: Yes LLE Weight Bearing: Non weight bearing      Mobility  Bed Mobility Overal bed mobility: Independent             General bed mobility comments: Pt indicates only minimal pain with getting himself up to sitting EOB  Transfers Overall transfer level: Modified independent Equipment used: Rolling walker (2 wheeled)             General transfer comment: Pt only needs cues for hand placement and general set up cuing.  Ambulation/Gait Ambulation/Gait assistance: Supervision Ambulation Distance (Feet): 175 Feet Assistive device: Rolling walker (2 wheeled)       General Gait Details: Pt is able to maintain NWBing and ultimately shows good confidence/safety and though he fatigued he was able to move with good speed and swing-through hops  Stairs Stairs: Yes Stairs assistance: Modified independent (Device/Increase time) Stair Management: Backwards;With walker Number of Stairs:  2 General stair comments: Pt was able to use UEs well enough to control ascent/descent safely and with good relative confidence   Wheelchair Mobility    Modified Rankin (Stroke Patients Only)       Balance Overall balance assessment: Independent                                           Pertinent Vitals/Pain Pain Assessment: 0-10 Pain Score: 5     Home Living Family/patient expects to be discharged to:: Private residence Living Arrangements: Spouse/significant other Available Help at Discharge: Family Type of Home: House Home Access: Stairs to enter Entrance Stairs-Rails: None Entrance Stairs-Number of Steps: 2          Prior Function Level of Independence: Independent         Comments: Pt reports he would 5 miles multiple times a weeks     Hand Dominance        Extremity/Trunk Assessment   Upper Extremity Assessment: Overall WFL for tasks assessed           Lower Extremity Assessment: Overall WFL for tasks assessed (L in KI, limited, but able to do SLRs and show some function)         Communication   Communication: No difficulties  Cognition Arousal/Alertness: Awake/alert Behavior During Therapy: WFL for tasks assessed/performed Overall Cognitive Status: Within Functional Limits for tasks assessed  General Comments      Exercises        Assessment/Plan    PT Assessment Patient needs continued PT services  PT Diagnosis Difficulty walking;Acute pain;Generalized weakness   PT Problem List Decreased strength;Decreased Sada of motion;Decreased activity tolerance;Decreased balance;Decreased safety awareness;Decreased knowledge of precautions;Decreased knowledge of use of DME;Pain  PT Treatment Interventions DME instruction;Gait training;Stair training;Functional mobility training;Therapeutic activities;Therapeutic exercise;Balance training;Neuromuscular re-education;Patient/family education   PT  Goals (Current goals can be found in the Care Plan section) Acute Rehab PT Goals Patient Stated Goal: go home PT Goal Formulation: With patient Time For Goal Achievement: 12/14/15 Potential to Achieve Goals: Good    Frequency 7X/week   Barriers to discharge        Co-evaluation               End of Session Equipment Utilized During Treatment: Gait belt Activity Tolerance: Patient tolerated treatment well Patient left: with chair alarm set;with call bell/phone within reach Nurse Communication: Mobility status         Time: [id number] PT Time Calculation (min) (ACUTE ONLY): 22 min   Charges:   PT Evaluation $PT Eval Low Complexity: 1 Procedure     PT G CodesKreg Shropshire, DPT 11/30/2015, 12:21 PM

## 2015-11-30 NOTE — Anesthesia Postprocedure Evaluation (Signed)
Anesthesia Post Note  Patient: Jordan Barber  Procedure(s) Performed: Procedure(s) (LRB): OPEN REDUCTION INTERNAL FIXATION (ORIF) TIBIAL PLATEAU (Left)  Patient location during evaluation: Other (Floor) Anesthesia Type: Spinal Level of consciousness: awake and alert Pain management: pain level controlled Vital Signs Assessment: post-procedure vital signs reviewed and stable Respiratory status: spontaneous breathing, nonlabored ventilation, respiratory function stable and patient connected to nasal cannula oxygen Cardiovascular status: blood pressure returned to baseline and stable Postop Assessment: no signs of nausea or vomiting Anesthetic complications: no    Last Vitals:  Vitals:   11/30/15 0321 11/30/15 0845  BP: 130/71 117/64  Pulse: 68 68  Resp: 16 16  Temp: 36.6 C 37 C    Last Pain:  Vitals:   11/30/15 0845  TempSrc: Oral  PainSc: 0-No pain                 Martha Clan

## 2015-11-30 NOTE — Discharge Summary (Signed)
Physician Discharge Summary  Patient ID: Jordan Barber MRN: [id number] DOB/AGE: [birth year] 70 y.o.  Admit date: 11/29/2015 Discharge date: 11/30/2015  Admission Diagnoses:  Fracture [T14.8] Tibial plateau fracture, left, closed, initial encounter [S82.142A] Displaced comminuted split compression fracture, left lateral tibial plateau.  Discharge Diagnoses: Patient Active Problem List   Diagnosis Date Noted  . Tibial plateau fracture, left 11/29/2015  Displaced comminuted split compression fracture, left lateral tibial plateau.  Past Medical History:  Diagnosis Date  . Prostate cancer [facility])      Transfusion: None   Consultants (if any):   Discharged Condition: Improved  Hospital Course: Jordan Barber is an 70 y.o. male who was admitted 11/29/2015 with a diagnosis of Displaced comminuted split compression fracture, left lateral tibial plateau and went to the operating room on 11/29/2015 and underwent the above named procedures.    Surgeries: Procedure(s): OPEN REDUCTION INTERNAL FIXATION (ORIF) TIBIAL PLATEAU on 11/29/2015 Patient tolerated the surgery well. Taken to PACU where she was stabilized and then transferred to the orthopedic floor.  Started on Lovenox 40mg  q 24 hrs. Foot pumps applied bilaterally at 80 mm. Heels elevated on bed with rolled towels. No evidence of DVT. Negative Homan. Physical therapy started on day #1 for gait training and transfer. OT started day #1 for ADL and assisted devices.  Patient's IV was d/c on POD1.  Implants: Biomet 5-hole precontoured lateral tibial plateau plate  He was given perioperative antibiotics:  Anti-infectives    Start     Dose/Rate Route Frequency Ordered Stop   11/29/15 2300  ceFAZolin (ANCEF) IVPB 2g/100 mL premix     2 g 200 mL/hr over 30 Minutes Intravenous Every 6 hours 11/29/15 2040 11/30/15 1659    .  He was given sequential compression devices, early ambulation, and Lovenox for DVT prophylaxis.  He benefited maximally  from the hospital stay and there were no complications.    Recent vital signs:  Vitals:   11/30/15 0135 11/30/15 0321  BP: 132/70 130/71  Pulse: 66 68  Resp: 16 16  Temp: 97.8 F (36.6 C) 97.9 F (36.6 C)    Recent laboratory studies:  Lab Results  Component Value Date   HGB 13.1 11/30/2015   HGB 15.3 11/29/2015   Lab Results  Component Value Date   WBC 11.0 (H) 11/30/2015   PLT 145 (L) 11/30/2015   Lab Results  Component Value Date   INR 1.07 11/29/2015   Lab Results  Component Value Date   NA 133 (L) 11/30/2015   K 3.5 11/30/2015   CL 101 11/30/2015   CO2 24 11/30/2015   BUN 10 11/30/2015   CREATININE 0.63 11/30/2015   GLUCOSE 151 (H) 11/30/2015    Discharge Medications:     Medication List    TAKE these medications   aspirin 325 MG tablet Take 325 mg by mouth at bedtime.   clobetasol ointment 0.05 % Commonly known as:  TEMOVATE Apply 1 application topically 2 (two) times daily.   clobetasol 0.05 % external solution Commonly known as:  TEMOVATE Apply 1 application topically 2 (two) times daily.   oxyCODONE 5 MG immediate release tablet Commonly known as:  Oxy IR/ROXICODONE Take 1-2 tablets (5-10 mg total) by mouth every 4 (four) hours as needed for breakthrough pain.       Diagnostic Studies: Dg Tibia/fibula Left  Result Date: 11/29/2015 CLINICAL DATA:  70 year old male undergoing ORIF left tibia fracture. Initial encounter. EXAM: DG C-ARM 61-120 MIN; LEFT TIBIA AND FIBULA - 2  VIEW COMPARISON:  Left knee CT 1429 hours today. FINDINGS: Three intraoperative fluoroscopic views demonstrate placement of a lateral plate and multiple cortical screws traversing the comminuted and depressed lateral tibial plateau fracture. Hardware appears intact. IMPRESSION: Left proximal tibia ORIF with no adverse features. Electronically Signed   By: Genevie Ann M.D.   On: 11/29/2015 19:11   Ct Knee Left Wo Contrast  Result Date: 11/29/2015 CLINICAL DATA:  A dog fragment  patient down steps today resulting in a left knee fracture. Initial encounter. EXAM: CT OF THE LEFT KNEE WITHOUT CONTRAST TECHNIQUE: Multidetector CT imaging of the left knee was performed according to the standard protocol. Multiplanar CT image reconstructions were also generated. COMPARISON:  Plain films left knee this same day. FINDINGS: Bones/Joint/Cartilage As seen the comparison plain films, the patient has a lateral tibial plateau fracture. The fracture extends from just peripheral to the tibial eminences in an inferior and lateral orientation through the metaphysis approximately 6 cm below the joint line. The lateral 1.8 cm of the articular surface of the tibial plateau extending to the metaphysis is a separate, floating fragment and is laterally displaced approximately 2.4 cm. There is a large gap at the articular surface measuring 2.0 cm transverse by approximately 3.6 cm AP. A fragment of plateau which includes 1.4 cm transverse by 2.0 cm AP of the articular surface is depressed approximately 2.2 cm. The anterior cortex of the proximal tibia is distracted up to 0.5 cm. The fracture has a nondisplaced component extending through the medial tibial eminence. No other fracture is identified. Ligaments Suboptimally assessed by CT. The anterior and posterior cruciate ligaments and medial collateral ligament are intact. The iliotibial band is intact. The fibular collateral ligament is not well seen but likely intact. The Muscles and Tendons Unremarkable. Soft tissues Lipohemarthrosis is noted. IMPRESSION: Impacted and displaced lateral tibial plateau fracture as described above with associated lipohemarthrosis. Electronically Signed   By: Inge Rise M.D.   On: 11/29/2015 14:57   Dg Knee Complete 4 Views Left  Result Date: 11/29/2015 CLINICAL DATA:  Pain following fall EXAM: LEFT KNEE - COMPLETE 4+ VIEW COMPARISON:  None. FINDINGS: Frontal, lateral, tunnel, and oblique views were obtained. There is a  fracture of the lateral tibial plateau with depression of the lateral tibial plateau and multiple displaced fracture fragments. Fractures extend into the medial and lateral tibial eminence regions. There is no appreciable dislocation. There is a fat -fluid level in the suprapatellar bursa consistent with the fracture. There is no appreciable joint space narrowing. IMPRESSION: Comminuted fracture of the proximal lateral tibia with tibial plateau depression and displacement of fracture fragments. There are fractures in each tibial eminence. There is a sizable joint effusion with fat -fluid level indicative of the fracture. No frank dislocation is evident. No significant arthropathy. Electronically Signed   By: Lowella Grip III M.D.   On: 11/29/2015 13:24   Dg C-arm 1-60 Min  Result Date: 11/29/2015 CLINICAL DATA:  70 year old male undergoing ORIF left tibia fracture. Initial encounter. EXAM: DG C-ARM 61-120 MIN; LEFT TIBIA AND FIBULA - 2 VIEW COMPARISON:  Left knee CT 1429 hours today. FINDINGS: Three intraoperative fluoroscopic views demonstrate placement of a lateral plate and multiple cortical screws traversing the comminuted and depressed lateral tibial plateau fracture. Hardware appears intact. IMPRESSION: Left proximal tibia ORIF with no adverse features. Electronically Signed   By: Genevie Ann M.D.   On: 11/29/2015 19:11   Disposition: Pt will discharge home today, remain non-weightbearing to the left  lower extremity.  Can work on ROM from 0-45 degrees with HHPT.  When ambulated keep knee locked in extension.  ASA 325 daily for DVT prophylaxis.   Follow-up Information    Judson Roch, PA-C Follow up in 14 day(s).   Specialty:  Physician Assistant Why:  Staple removal and skin check. Contact information: [place] [address] [phone]            Signed: Judson Roch PA-C 11/30/2015, 8:45 AM

## 2015-12-03 ENCOUNTER — Encounter: Payer: Self-pay | Admitting: Surgery

## 2016-03-24 ENCOUNTER — Telehealth: Payer: Self-pay | Admitting: *Deleted

## 2016-03-24 NOTE — Telephone Encounter (Signed)
Received referral for low dose lung cancer screening CT scan. Voicemail left at phone number listed in EMR for patient to call me back to facilitate scheduling scan.  

## 2016-03-30 ENCOUNTER — Telehealth: Payer: Self-pay | Admitting: *Deleted

## 2016-03-30 NOTE — Telephone Encounter (Signed)
Received referral for initial lung cancer screening scan. Contacted patient and obtained smoking history,(current, 50 pack year) as well as answering questions related to screening process. Patient denies signs of lung cancer such as weight loss or hemoptysis. Patient denies comorbidity that would prevent curative treatment if lung cancer were found. Patient is tentatively scheduled for shared decision making visit and CT scan on 04/07/16 at 1:30pm, pending insurance approval from business office.

## 2016-04-13 ENCOUNTER — Other Ambulatory Visit: Payer: Self-pay | Admitting: *Deleted

## 2016-04-13 DIAGNOSIS — Z87891 Personal history of nicotine dependence: Secondary | ICD-10-CM

## 2016-04-14 ENCOUNTER — Ambulatory Visit
Admission: RE | Admit: 2016-04-14 | Discharge: 2016-04-14 | Disposition: A | Discharge status: Home / Self Care | Payer: Medicare Other | Source: Ambulatory Visit | Attending: Oncology | Admitting: Oncology

## 2016-04-14 ENCOUNTER — Encounter: Payer: Self-pay | Admitting: Oncology

## 2016-04-14 ENCOUNTER — Inpatient Hospital Stay: Payer: Medicare Other | Attending: Oncology | Admitting: Oncology

## 2016-04-14 DIAGNOSIS — F17210 Nicotine dependence, cigarettes, uncomplicated: Secondary | ICD-10-CM | POA: Diagnosis not present

## 2016-04-14 DIAGNOSIS — Z87891 Personal history of nicotine dependence: Secondary | ICD-10-CM

## 2016-04-14 DIAGNOSIS — J432 Centrilobular emphysema: Secondary | ICD-10-CM | POA: Diagnosis not present

## 2016-04-14 DIAGNOSIS — R590 Localized enlarged lymph nodes: Secondary | ICD-10-CM | POA: Insufficient documentation

## 2016-04-14 DIAGNOSIS — I2510 Atherosclerotic heart disease of native coronary artery without angina pectoris: Secondary | ICD-10-CM | POA: Insufficient documentation

## 2016-04-14 DIAGNOSIS — I700 Atherosclerosis of aorta: Secondary | ICD-10-CM | POA: Diagnosis not present

## 2016-04-14 DIAGNOSIS — Z122 Encounter for screening for malignant neoplasm of respiratory organs: Secondary | ICD-10-CM | POA: Diagnosis not present

## 2016-04-16 ENCOUNTER — Encounter: Payer: Self-pay | Admitting: *Deleted

## 2016-04-18 DIAGNOSIS — Z87891 Personal history of nicotine dependence: Secondary | ICD-10-CM | POA: Insufficient documentation

## 2016-04-18 NOTE — Progress Notes (Signed)
In accordance with CMS guidelines, patient has met eligibility criteria including age, absence of signs or symptoms of lung cancer.  Social History  Substance Use Topics  . Smoking status: Current Every Day Smoker    Packs/day: 1.00    Years: 50.00    Types: Cigarettes  . Smokeless tobacco: Not on file  . Alcohol use Yes     A shared decision-making session was conducted prior to the performance of CT scan. This includes one or more decision aids, includes benefits and harms of screening, follow-up diagnostic testing, over-diagnosis, false positive rate, and total radiation exposure.  Counseling on the importance of adherence to annual lung cancer LDCT screening, impact of co-morbidities, and ability or willingness to undergo diagnosis and treatment is imperative for compliance of the program.  Counseling on the importance of continued smoking cessation for former smokers; the importance of smoking cessation for current smokers, and information about tobacco cessation interventions have been given to patient including [place] and [employer] quit [employer] programs.  Written order for lung cancer screening with LDCT has been given to the patient and any and all questions have been answered to the best of my abilities.   Yearly follow up will be coordinated by Burgess Estelle, Thoracic Navigator.

## 2016-12-15 ENCOUNTER — Encounter: Payer: Self-pay | Admitting: General Surgery

## 2016-12-15 ENCOUNTER — Ambulatory Visit (INDEPENDENT_AMBULATORY_CARE_PROVIDER_SITE_OTHER): Payer: Medicare Other | Admitting: General Surgery

## 2016-12-15 VITALS — BP 130/80 | HR 83 | Resp 12 | Ht 72.0 in | Wt 161.0 lb

## 2016-12-15 DIAGNOSIS — Z1211 Encounter for screening for malignant neoplasm of colon: Secondary | ICD-10-CM | POA: Insufficient documentation

## 2016-12-15 MED ORDER — POLYETHYLENE GLYCOL 3350 17 GM/SCOOP PO POWD: 1.0000 | Freq: Once | ORAL | 0 refills | Status: DC

## 2016-12-15 NOTE — Progress Notes (Signed)
Patient ID: GARI HARTSELL, male   DOB: 1945/06/20, 71 y.o.   MRN: [id number]  Chief Complaint  Patient presents with  . Colonoscopy    HPI MAXINE HUYNH is a 71 y.o. male here today for a evaluation of screening colonoscopy. Last colonoscopy was 10/18/2006. Patient states he moves his bowels daily. No GI problems.   HPI  Past Medical History:  Diagnosis Date  . Prostate cancer [facility])     Past Surgical History:  Procedure Laterality Date  . COLONOSCOPY  10/18/2006  . ORIF TIBIA PLATEAU Left 11/29/2015   Procedure: OPEN REDUCTION INTERNAL FIXATION (ORIF) TIBIAL PLATEAU;  Surgeon: Corky Mull, MD;  Location: [facility] ORS;  Service: Orthopedics;  Laterality: Left;  . PROSTATE SURGERY  2011    No family history on file.  Social History Social History  Substance Use Topics  . Smoking status: Current Every Day Smoker    Packs/day: 1.00    Years: 50.00    Types: Cigarettes  . Smokeless tobacco: Never Used  . Alcohol use Yes    No Known Allergies  Current Outpatient Prescriptions  Medication Sig Dispense Refill  . aspirin EC 81 MG tablet Take 81 mg by mouth daily.    . clobetasol ointment (TEMOVATE) 0.97 % Apply 1 application topically 2 (two) times daily.    . polyethylene glycol powder (GLYCOLAX/MIRALAX) powder Take 255 g by mouth once. Mix whole container with 64 ounces of clear liquids 255 g 0   No current facility-administered medications for this visit.     Review of Systems Review of Systems  Constitutional: Negative.   Respiratory: Negative.   Cardiovascular: Negative.   Gastrointestinal: Negative.     Blood pressure 130/80, pulse 83, resp. rate 12, height 6' (1.829 m), weight 161 lb (73 kg).  Physical Exam Physical Exam  Constitutional: He is oriented to person, place, and time. He appears well-developed and well-nourished.  Cardiovascular: Normal rate, regular rhythm and normal heart sounds.   Pulmonary/Chest: Effort normal and breath sounds normal.  Neurological:  He is alert and oriented to person, place, and time.  Skin: Skin is warm and dry.    Data Reviewed Upper endoscopy completed 10/18/2006 for suspected Barrett's esophagus showed only changes of reflux esophagitis. Intraepithelial CFN was noted, acute and chronic inflammation.  Colonoscopy of the same date showed several polyps in the sigmoid and rectum, all hyperplastic on biopsy.  Colonoscopy completed 08/23/2001 had shown a single adenomatous polyp of the descending colon.  Screening chest CT dated 04/15/2016 showed no evidence of lung malignancy.   Assessment    Candidate for screening colonoscopy.    Plan       Colonoscopy with possible biopsy/polypectomy prn: Information regarding the procedure, including its potential risks and complications (including but not limited to perforation of the bowel, which may require emergency surgery to repair, and bleeding) was verbally given to the patient. Educational information regarding lower intestinal endoscopy was given to the patient. Written instructions for how to complete the bowel prep using Miralax were provided. The importance of drinking ample fluids to avoid dehydration as a result of the prep emphasized.  HPI, Physical Exam, Assessment and Plan have been scribed under the direction and in the presence of Hervey Ard, MD.  Gaspar Cola, CMA  The patient is scheduled for a Colonoscopy at [facility] on 02/03/17. They are aware to call the day before to get their arrival time. He may continue his 81 mg Aspirin. Miralax prescription has been sent into  the patient's pharmacy. The patient is aware of date and instructions.  Documented by Lesly Rubenstein LPN    Robert Bellow 12/15/2016, 9:24 PM

## 2016-12-15 NOTE — Patient Instructions (Addendum)
Colonoscopy, Adult A colonoscopy is an exam to look at the entire large intestine. During the exam, a lubricated, bendable tube is inserted into the anus and then passed into the rectum, colon, and other parts of the large intestine. A colonoscopy is often done as a part of normal colorectal screening or in response to certain symptoms, such as anemia, persistent diarrhea, abdominal pain, and blood in the stool. The exam can help screen for and diagnose medical problems, including:  Tumors.  Polyps.  Inflammation.  Areas of bleeding.  Tell a health care provider about:  Any allergies you have.  All medicines you are taking, including vitamins, herbs, eye drops, creams, and over-the-counter medicines.  Any problems you or family members have had with anesthetic medicines.  Any blood disorders you have.  Any surgeries you have had.  Any medical conditions you have.  Any problems you have had passing stool. What are the risks? Generally, this is a safe procedure. However, problems may occur, including:  Bleeding.  A tear in the intestine.  A reaction to medicines given during the exam.  Infection (rare).  What happens before the procedure? Eating and drinking restrictions Follow instructions from your health care provider about eating and drinking, which may include:  A few days before the procedure - follow a low-fiber diet. Avoid nuts, seeds, dried fruit, raw fruits, and vegetables.  1-3 days before the procedure - follow a clear liquid diet. Drink only clear liquids, such as clear broth or bouillon, black coffee or tea, clear juice, clear soft drinks or sports drinks, gelatin dessert, and popsicles. Avoid any liquids that contain red or purple dye.  On the day of the procedure - do not eat or drink anything during the 2 hours before the procedure, or within the time period that your health care provider recommends.  Bowel prep If you were prescribed an oral bowel prep  to clean out your colon:  Take it as told by your health care provider. Starting the day before your procedure, you will need to drink a large amount of medicated liquid. The liquid will cause you to have multiple loose stools until your stool is almost clear or light green.  If your skin or anus gets irritated from diarrhea, you may use these to relieve the irritation: ? Medicated wipes, such as adult wet wipes with aloe and vitamin E. ? A skin soothing-product like petroleum jelly.  If you vomit while drinking the bowel prep, take a break for up to 60 minutes and then begin the bowel prep again. If vomiting continues and you cannot take the bowel prep without vomiting, call your health care provider.  General instructions  Ask your health care provider about changing or stopping your regular medicines. This is especially important if you are taking diabetes medicines or blood thinners.  Plan to have someone take you home from the hospital or clinic. What happens during the procedure?  An IV tube may be inserted into one of your veins.  You will be given medicine to help you relax (sedative).  To reduce your risk of infection: ? Your health care team will wash or sanitize their hands. ? Your anal area will be washed with soap.  You will be asked to lie on your side with your knees bent.  Your health care provider will lubricate a long, thin, flexible tube. The tube will have a camera and a light on the end.  The tube will be inserted into your   anus.  The tube will be gently eased through your rectum and colon.  Air will be delivered into your colon to keep it open. You may feel some pressure or cramping.  The camera will be used to take images during the procedure.  A small tissue sample may be removed from your body to be examined under a microscope (biopsy). If any potential problems are found, the tissue will be sent to a lab for testing.  If small polyps are found, your  health care provider may remove them and have them checked for cancer cells.  The tube that was inserted into your anus will be slowly removed. The procedure may vary among health care providers and hospitals. What happens after the procedure?  Your blood pressure, heart rate, breathing rate, and blood oxygen level will be monitored until the medicines you were given have worn off.  Do not drive for 24 hours after the exam.  You may have a small amount of blood in your stool.  You may pass gas and have mild abdominal cramping or bloating due to the air that was used to inflate your colon during the exam.  It is up to you to get the results of your procedure. Ask your health care provider, or the department performing the procedure, when your results will be ready. This information is not intended to replace advice given to you by your health care provider. Make sure you discuss any questions you have with your health care provider. Document Released: 04/10/2000 Document Revised: 02/12/2016 Document Reviewed: 06/25/2015 Elsevier Interactive Patient Education  [employer].  The patient is scheduled for a Colonoscopy at [facility] on 02/03/17. They are aware to call the day before to get their arrival time. He may continue his 81 mg Aspirin. Miralax prescription has been sent into the patient's pharmacy. The patient is aware of date and instructions.

## 2017-01-14 ENCOUNTER — Other Ambulatory Visit: Payer: Self-pay | Admitting: *Deleted

## 2017-01-14 MED ORDER — POLYETHYLENE GLYCOL 3350 17 GM/SCOOP PO POWD: 1.0000 | Freq: Once | ORAL | 0 refills | Status: AC

## 2017-01-28 ENCOUNTER — Encounter: Payer: Self-pay | Admitting: *Deleted

## 2017-02-01 ENCOUNTER — Telehealth: Payer: Self-pay | Admitting: *Deleted

## 2017-02-01 NOTE — Telephone Encounter (Signed)
Patient was contacted today and confirms no medication changes since his last office visit.   This patient reports that he has picked up Miralax prescription.  We will proceed with colonoscopy as scheduled for 02-03-17 at [facility].   Patient was encouraged to call the office should he have further questions.

## 2017-02-02 ENCOUNTER — Encounter: Payer: Self-pay | Admitting: *Deleted

## 2017-02-03 ENCOUNTER — Ambulatory Visit: Payer: Medicare Other | Admitting: Anesthesiology

## 2017-02-03 ENCOUNTER — Ambulatory Visit
Admission: RE | Admit: 2017-02-03 | Discharge: 2017-02-03 | Disposition: A | Discharge status: Home / Self Care | Payer: Medicare Other | Source: Ambulatory Visit | Attending: General Surgery | Admitting: General Surgery

## 2017-02-03 ENCOUNTER — Encounter
Admission: RE | Disposition: A | Discharge status: Home / Self Care | Payer: Self-pay | Source: Ambulatory Visit | Attending: General Surgery

## 2017-02-03 DIAGNOSIS — Z8546 Personal history of malignant neoplasm of prostate: Secondary | ICD-10-CM | POA: Diagnosis not present

## 2017-02-03 DIAGNOSIS — Z7982 Long term (current) use of aspirin: Secondary | ICD-10-CM | POA: Insufficient documentation

## 2017-02-03 DIAGNOSIS — F17210 Nicotine dependence, cigarettes, uncomplicated: Secondary | ICD-10-CM | POA: Insufficient documentation

## 2017-02-03 DIAGNOSIS — Z1211 Encounter for screening for malignant neoplasm of colon: Secondary | ICD-10-CM | POA: Diagnosis not present

## 2017-02-03 HISTORY — PX: COLONOSCOPY WITH PROPOFOL: SHX5780

## 2017-02-03 SURGERY — COLONOSCOPY WITH PROPOFOL
Anesthesia: General

## 2017-02-03 MED ORDER — PROPOFOL 10 MG/ML IV BOLUS
INTRAVENOUS | Status: DC | PRN
  Administered 2017-02-03: 90 mg via INTRAVENOUS

## 2017-02-03 MED ORDER — PROPOFOL 10 MG/ML IV BOLUS
INTRAVENOUS | Status: AC
  Filled 2017-02-03: qty 20

## 2017-02-03 MED ORDER — PROPOFOL 500 MG/50ML IV EMUL
INTRAVENOUS | Status: DC | PRN
  Administered 2017-02-03: 120 ug/kg/min via INTRAVENOUS

## 2017-02-03 MED ORDER — SODIUM CHLORIDE 0.9 % IV SOLN
INTRAVENOUS | Status: DC
  Administered 2017-02-03: 08:00:00 via INTRAVENOUS

## 2017-02-03 MED ORDER — PROPOFOL 500 MG/50ML IV EMUL
INTRAVENOUS | Status: AC
  Filled 2017-02-03: qty 50

## 2017-02-03 NOTE — Op Note (Signed)
[facility] Gastroenterology Patient Name: Jordan Barber Procedure Date: 02/03/2017 7:38 AM MRN: [id number] Account #: [id number] Date of Birth: 11-23-1945 Admit Type: Outpatient Age: 71 Room: [facility] ENDO ROOM 1 Gender: Male Note Status: Finalized Procedure:            Colonoscopy Indications:          Screening for colorectal malignant neoplasm Providers:            Robert Bellow, MD Referring MD:         Leona Carry. Hall Busing, MD (Referring MD) Medicines:            Monitored Anesthesia Care Complications:        No immediate complications. Procedure:            Pre-Anesthesia Assessment:                       - Prior to the procedure, a History and Physical was                        performed, and patient medications, allergies and                        sensitivities were reviewed. The patient's tolerance of                        previous anesthesia was reviewed.                       - The risks and benefits of the procedure and the                        sedation options and risks were discussed with the                        patient. All questions were answered and informed                        consent was obtained.                       After obtaining informed consent, the colonoscope was                        passed under direct vision. Throughout the procedure,                        the patient's blood pressure, pulse, and oxygen                        saturations were monitored continuously. The                        Colonoscope was introduced through the anus and                        advanced to the the cecum, identified by appendiceal                        orifice and ileocecal valve. The colonoscopy was  performed without difficulty. The patient tolerated the                        procedure well. The quality of the bowel preparation                        was adequate to identify polyps. Findings:      The entire examined  colon appeared normal on direct and retroflexion       views. Impression:           - The entire examined colon is normal on direct and                        retroflexion views.                       - No specimens collected. Recommendation:       - Repeat colonoscopy in 10 years for screening purposes. Procedure Code(s):    --- Professional ---                       [phone], Colonoscopy, flexible; diagnostic, including                        collection of specimen(s) by brushing or washing, when                        performed (separate procedure) Diagnosis Code(s):    --- Professional ---                       Z12.11, Encounter for screening for malignant neoplasm                        of colon CPT copyright 2016 American Medical Association. All rights reserved. The codes documented in this report are preliminary and upon coder review may  be revised to meet current compliance requirements. Robert Bellow, MD 02/03/2017 8:47:20 AM This report has been signed electronically. Number of Addenda: 0 Note Initiated On: 02/03/2017 7:38 AM Scope Withdrawal Time: 0 hours 8 minutes 17 seconds  Total Procedure Duration: 0 hours 21 minutes 3 seconds       [facility]

## 2017-02-03 NOTE — Anesthesia Preprocedure Evaluation (Signed)
Anesthesia Evaluation  Patient identified by MRN, date of birth, ID band Patient awake    Reviewed: Allergy & Precautions, NPO status , Patient's Chart, lab work & pertinent test results  History of Anesthesia Complications Negative for: history of anesthetic complications  Airway Mallampati: II       Dental   Pulmonary neg sleep apnea, neg COPD, Current Smoker,           Cardiovascular (-) hypertension(-) Past MI and (-) CHF (-) dysrhythmias (-) Valvular Problems/Murmurs     Neuro/Psych neg Seizures    GI/Hepatic Neg liver ROS, neg GERD  ,  Endo/Other  neg diabetes  Renal/GU negative Renal ROS     Musculoskeletal   Abdominal   Peds  Hematology   Anesthesia Other Findings   Reproductive/Obstetrics                             Anesthesia Physical Anesthesia Plan  ASA: II  Anesthesia Plan: General   Post-op Pain Management:    Induction: Intravenous  PONV Risk Score and Plan: Propofol infusion  Airway Management Planned: Nasal Cannula  Additional Equipment:   Intra-op Plan:   Post-operative Plan:   Informed Consent: I have reviewed the patients History and Physical, chart, labs and discussed the procedure including the risks, benefits and alternatives for the proposed anesthesia with the patient or authorized representative who has indicated his/her understanding and acceptance.     Plan Discussed with:   Anesthesia Plan Comments:         Anesthesia Quick Evaluation

## 2017-02-03 NOTE — Anesthesia Postprocedure Evaluation (Signed)
Anesthesia Post Note  Patient: Jordan Barber  Procedure(s) Performed: COLONOSCOPY WITH PROPOFOL (N/A )  Patient location during evaluation: Endoscopy Anesthesia Type: General Level of consciousness: awake and alert Pain management: pain level controlled Vital Signs Assessment: post-procedure vital signs reviewed and stable Respiratory status: spontaneous breathing and respiratory function stable Cardiovascular status: stable Anesthetic complications: no     Last Vitals:  Vitals:   02/03/17 0804 02/03/17 0852  BP: (!) 157/102 (!) 115/98  Pulse: 85 73  Resp: 16   Temp: (!) 36.3 C (!) 35.8 C  SpO2: 96% 100%    Last Pain:  Vitals:   02/03/17 0852  TempSrc: Tympanic                 Joandy Burget K

## 2017-02-03 NOTE — H&P (Signed)
Jordan Barber [id number] [birth year]     HPI: Healthy 71 y/o male for screening colonoscopy. Tolerated prep well.   Prescriptions Prior to Admission  Medication Sig Dispense Refill Last Dose  . aspirin EC 81 MG tablet Take 81 mg by mouth daily.   02/02/2017 at Unknown time  . clobetasol ointment (TEMOVATE) 1.44 % Apply 1 application topically 2 (two) times daily.   Past Month at Unknown time   No Known Allergies Past Medical History:  Diagnosis Date  . Prostate cancer [facility])    Past Surgical History:  Procedure Laterality Date  . COLONOSCOPY  10/18/2006  . ORIF TIBIA PLATEAU Left 11/29/2015   Procedure: OPEN REDUCTION INTERNAL FIXATION (ORIF) TIBIAL PLATEAU;  Surgeon: Corky Mull, MD;  Location: [facility] ORS;  Service: Orthopedics;  Laterality: Left;  . PROSTATE SURGERY  2011   Social History   Social History  . Marital status: Married    Spouse name: N/A  . Number of children: N/A  . Years of education: N/A   Occupational History  . Not on file.   Social History Main Topics  . Smoking status: Current Every Day Smoker    Packs/day: 1.00    Years: 50.00    Types: Cigarettes  . Smokeless tobacco: Never Used  . Alcohol use Yes     Comment: occasional beers  . Drug use: No  . Sexual activity: Not on file   Other Topics Concern  . Not on file   Social History Narrative  . No narrative on file   Social History   Social History Narrative  . No narrative on file     ROS: Negative.     PE: HEENT: Negative. Lungs: Clear. Cardio: RR.  Assessment/Plan:  Proceed with planned endoscopy.  Robert Bellow 02/03/2017

## 2017-02-03 NOTE — Anesthesia Post-op Follow-up Note (Signed)
Anesthesia QCDR form completed.        

## 2017-02-03 NOTE — Transfer of Care (Signed)
Immediate Anesthesia Transfer of Care Note  Patient: Jordan Barber  Procedure(s) Performed: COLONOSCOPY WITH PROPOFOL (N/A )  Patient Location: PACU and Endoscopy Unit  Anesthesia Type:General  Level of Consciousness: drowsy and patient cooperative  Airway & Oxygen Therapy: Patient Spontanous Breathing and Patient connected to nasal cannula oxygen  Post-op Assessment: Report given to RN and Post -op Vital signs reviewed and stable  Post vital signs: Reviewed and stable  Last Vitals:  Vitals:   02/03/17 0804 02/03/17 0852  BP: (!) 157/102 (!) 115/98  Pulse: 85 73  Resp: 16   Temp: (!) 36.3 C (!) 35.8 C  SpO2: 96% 100%    Last Pain:  Vitals:   02/03/17 0852  TempSrc: Tympanic         Complications: No apparent anesthesia complications

## 2017-02-04 ENCOUNTER — Encounter: Payer: Self-pay | Admitting: General Surgery

## 2017-04-14 ENCOUNTER — Telehealth: Payer: Self-pay | Admitting: *Deleted

## 2017-04-14 DIAGNOSIS — Z122 Encounter for screening for malignant neoplasm of respiratory organs: Secondary | ICD-10-CM

## 2017-04-14 DIAGNOSIS — Z87891 Personal history of nicotine dependence: Secondary | ICD-10-CM

## 2017-04-14 NOTE — Telephone Encounter (Signed)
Notified patient that annual lung cancer screening low dose CT scan is due currently or will be in near future. Confirmed that patient is within the age Lewinski of 55-77, and asymptomatic, (no signs or symptoms of lung cancer). Patient denies illness that would prevent curative treatment for lung cancer if found. Verified smoking history, (current, 51 pack year). The shared decision making visit was done 04/14/16. Patient is agreeable for CT scan being scheduled.

## 2017-04-21 ENCOUNTER — Encounter: Payer: Self-pay | Admitting: General Surgery

## 2017-05-04 ENCOUNTER — Inpatient Hospital Stay: Admission: RE | Admit: 2017-05-04 | Payer: Medicare Other | Source: Ambulatory Visit

## 2017-05-06 ENCOUNTER — Ambulatory Visit: Admission: RE | Admit: 2017-05-06 | Payer: Medicare Other | Source: Ambulatory Visit

## 2017-05-06 ENCOUNTER — Ambulatory Visit
Admission: RE | Admit: 2017-05-06 | Discharge: 2017-05-06 | Disposition: A | Discharge status: Home / Self Care | Payer: Medicare Other | Source: Ambulatory Visit | Attending: Nurse Practitioner | Admitting: Nurse Practitioner

## 2017-05-06 DIAGNOSIS — I700 Atherosclerosis of aorta: Secondary | ICD-10-CM | POA: Diagnosis not present

## 2017-05-06 DIAGNOSIS — Z87891 Personal history of nicotine dependence: Secondary | ICD-10-CM | POA: Insufficient documentation

## 2017-05-06 DIAGNOSIS — J432 Centrilobular emphysema: Secondary | ICD-10-CM | POA: Diagnosis not present

## 2017-05-06 DIAGNOSIS — I2510 Atherosclerotic heart disease of native coronary artery without angina pectoris: Secondary | ICD-10-CM | POA: Insufficient documentation

## 2017-05-06 DIAGNOSIS — Z122 Encounter for screening for malignant neoplasm of respiratory organs: Secondary | ICD-10-CM | POA: Insufficient documentation

## 2017-05-10 ENCOUNTER — Encounter: Payer: Self-pay | Admitting: *Deleted

## 2017-12-02 ENCOUNTER — Emergency Department: Payer: Medicare Other

## 2017-12-02 ENCOUNTER — Emergency Department
Admission: EM | Admit: 2017-12-02 | Discharge: 2017-12-02 | Disposition: A | Discharge status: Home / Self Care | Payer: Medicare Other | Attending: Emergency Medicine | Admitting: Emergency Medicine

## 2017-12-02 ENCOUNTER — Encounter: Payer: Self-pay | Admitting: Emergency Medicine

## 2017-12-02 ENCOUNTER — Other Ambulatory Visit: Payer: Self-pay

## 2017-12-02 DIAGNOSIS — Z7982 Long term (current) use of aspirin: Secondary | ICD-10-CM | POA: Insufficient documentation

## 2017-12-02 DIAGNOSIS — I639 Cerebral infarction, unspecified: Secondary | ICD-10-CM | POA: Insufficient documentation

## 2017-12-02 DIAGNOSIS — F17210 Nicotine dependence, cigarettes, uncomplicated: Secondary | ICD-10-CM | POA: Diagnosis not present

## 2017-12-02 DIAGNOSIS — B0221 Postherpetic geniculate ganglionitis: Secondary | ICD-10-CM

## 2017-12-02 DIAGNOSIS — Z79899 Other long term (current) drug therapy: Secondary | ICD-10-CM | POA: Insufficient documentation

## 2017-12-02 DIAGNOSIS — Z8546 Personal history of malignant neoplasm of prostate: Secondary | ICD-10-CM | POA: Insufficient documentation

## 2017-12-02 DIAGNOSIS — R200 Anesthesia of skin: Secondary | ICD-10-CM | POA: Diagnosis present

## 2017-12-02 MED ORDER — ATORVASTATIN CALCIUM 80 MG PO TABS: 80.0000 mg | ORAL_TABLET | Freq: Every day | ORAL | 0 refills | Status: DC

## 2017-12-02 MED ORDER — ASPIRIN 81 MG PO CHEW
324.0000 mg | CHEWABLE_TABLET | Freq: Once | ORAL | Status: AC
  Administered 2017-12-02: 324 mg via ORAL
  Filled 2017-12-02: qty 4

## 2017-12-02 MED ORDER — PREDNISONE 50 MG PO TABS: 50.0000 mg | ORAL_TABLET | Freq: Every day | ORAL | 0 refills | Status: AC

## 2017-12-02 MED ORDER — VALACYCLOVIR HCL 500 MG PO TABS
1000.0000 mg | ORAL_TABLET | Freq: Once | ORAL | Status: AC
  Administered 2017-12-02: 1000 mg via ORAL
  Filled 2017-12-02: qty 2

## 2017-12-02 MED ORDER — PREDNISONE 20 MG PO TABS
60.0000 mg | ORAL_TABLET | Freq: Once | ORAL | Status: AC
  Administered 2017-12-02: 60 mg via ORAL
  Filled 2017-12-02: qty 3

## 2017-12-02 MED ORDER — VALACYCLOVIR HCL 1 G PO TABS: 2000.0000 mg | ORAL_TABLET | Freq: Three times a day (TID) | ORAL | 0 refills | Status: AC

## 2017-12-02 NOTE — ED Triage Notes (Addendum)
Pt arrived via POV with reports of left side facial numbness since yesterday. Pt states he started dribbling coffee and is unable to close left eye completely.  Pt denies any headaches, pt denies any left side weakness or numbness. Pt neurologically intact, NAD. Grips strength equal.  No prior hx of Bell's Palsy.

## 2017-12-02 NOTE — ED Provider Notes (Signed)
She has had left facial weakness for about 24 hours.  He cannot close his left eye although he is able to raise his left brow.  This very well could be Bell's palsy although he has some upper face function raising concern for stroke in an elderly man.  MRI is pending.   Darel Hong, MD 12/02/17 [phone]

## 2017-12-02 NOTE — ED Provider Notes (Signed)
[facility] Emergency Department Provider Note  ____________________________________________   First MD Initiated Contact with Patient 12/02/17 1515     (approximate)  I have reviewed the triage vital signs and the nursing notes.   HISTORY  Chief Complaint Numbness and Facial Droop   HPI Jordan Barber is a 72 y.o. male who comes to the emergency department with roughly 24 hours of left facial paralysis.  He noticed it when he was drinking coffee today and cough history dribbling out of the left side of his mouth.  He is been unable to close his left eye.  No numbness or weakness.  No tenderness.  No chest pain shortness of breath abdominal pain nausea or vomiting.  No other neurological symptoms.  He has no history of peripheral artery disease, coronary artery disease, or stroke.  Symptoms came on suddenly have been constant are currently moderate severity and nothing seems to make them better or worse.  No history of tick exposure.    Past Medical History:  Diagnosis Date  . Prostate cancer [facility])     Patient Active Problem List   Diagnosis Date Noted  . Encounter for screening colonoscopy 12/15/2016  . Personal history of tobacco use, presenting hazards to health 04/18/2016  . Tibial plateau fracture, left 11/29/2015    Past Surgical History:  Procedure Laterality Date  . COLONOSCOPY  10/18/2006  . COLONOSCOPY WITH PROPOFOL N/A 02/03/2017   Procedure: COLONOSCOPY WITH PROPOFOL;  Surgeon: Robert Bellow, MD;  Location: [facility] ENDOSCOPY;  Service: Endoscopy;  Laterality: N/A;  . ORIF TIBIA PLATEAU Left 11/29/2015   Procedure: OPEN REDUCTION INTERNAL FIXATION (ORIF) TIBIAL PLATEAU;  Surgeon: Corky Mull, MD;  Location: [facility] ORS;  Service: Orthopedics;  Laterality: Left;  . PROSTATE SURGERY  2011    Prior to Admission medications   Medication Sig Start Date End Date Taking? Authorizing Provider  aspirin EC 81 MG tablet Take 81 mg by mouth daily.     [provider]  atorvastatin (LIPITOR) 80 MG tablet Take 1 tablet (80 mg total) by mouth at bedtime. 12/02/17 12/02/18  Darel Hong, MD  clobetasol ointment (TEMOVATE) 2.87 % Apply 1 application topically 2 (two) times daily.    [provider]  predniSONE (DELTASONE) 50 MG tablet Take 1 tablet (50 mg total) by mouth daily for 6 days. 12/02/17 12/08/17  Darel Hong, MD  valACYclovir (VALTREX) 1000 MG tablet Take 2 tablets (2,000 mg total) by mouth 3 (three) times daily for 7 days. 12/02/17 12/09/17  Darel Hong, MD    Allergies Patient has no known allergies.  History reviewed. No pertinent family history.  Social History Social History   Tobacco Use  . Smoking status: Current Every Day Smoker    Packs/day: 1.00    Years: 50.00    Pack years: 50.00    Types: Cigarettes  . Smokeless tobacco: Never Used  Substance Use Topics  . Alcohol use: Yes    Comment: occasional beers  . Drug use: No    Review of Systems Constitutional: No fever/chills Eyes: No visual changes. ENT: No sore throat. Cardiovascular: Denies chest pain. Respiratory: Denies shortness of breath. Gastrointestinal: No abdominal pain.  No nausea, no vomiting.  No diarrhea.  No constipation. Genitourinary: Negative for dysuria. Musculoskeletal: Negative for back pain. Skin: Negative for rash. Neurological: Positive for focal neuro defect  ____________________________________________   PHYSICAL EXAM:  VITAL SIGNS: ED Triage Vitals  Enc Vitals Group     BP 12/02/17 1042 (!)  170/90     Pulse Rate 12/02/17 1042 85     Resp 12/02/17 1042 18     Temp 12/02/17 1042 (!) 97.5 F (36.4 C)     Temp Source 12/02/17 1042 Oral     SpO2 12/02/17 1042 94 %     Weight 12/02/17 1043 162 lb (73.5 kg)     Height 12/02/17 1043 6' (1.829 m)     Head Circumference --      Peak Flow --      Pain Score 12/02/17 1043 0     Pain Loc --      Pain Edu? --      Excl. in [place]? --     Constitutional: Alert  and oriented x4 joking laughing pleasant cooperative speaks full clear sentences no diaphoresis Eyes: PERRL EOMI. Head: Atraumatic. Normal tympanic membranes bilaterally.  No lesions appreciated Nose: No congestion/rhinnorhea. Mouth/Throat: No trismus Neck: No stridor.   Cardiovascular: Normal rate, regular rhythm. Grossly normal heart sounds.  Good peripheral circulation. Respiratory: Normal respiratory effort.  No retractions. Lungs CTAB and moving good air Gastrointestinal:  Musculoskeletal: No lower extremity edema   Neurologic:  Normal speech and language.  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  The bottom of his face is paralyzed and he is unable to fully close his left eye however he is somewhat able to raise his left brow Skin:  Skin is warm, dry and intact. No rash noted. Psychiatric: Mood and affect are normal. Speech and behavior are normal.    ____________________________________________   DIFFERENTIAL includes but not limited to  Stroke, Bell's palsy, Ramsay Hunt ____________________________________________   LABS (all labs ordered are listed, but only abnormal results are displayed)  Labs Reviewed - No data to display   __________________________________________  EKG   ____________________________________________  RADIOLOGY  Head CT reviewed by me with no acute disease MRI of the brain reviewed by me consistent with either stroke versus Ramsay Hunt syndrome ____________________________________________   PROCEDURES  Procedure(s) performed: no  Procedures  Critical Care performed: no  ____________________________________________   INITIAL IMPRESSION / ASSESSMENT AND PLAN / ED COURSE  Pertinent labs & imaging results that were available during my care of the patient were reviewed by me and considered in my medical decision making (see chart for details).   As part of my medical decision making, I reviewed the following data within the electronic medical  record:  History obtained from family if available, nursing notes, old chart and ekg, as well as notes from prior ED visits.  The patient arrives with left facial palsy.  He is unable to close his left eye however he can somewhat raise his left brow raising concern for central etiology of his 7th nerve palsy.  Prior to my evaluation the patient had a CT scan of his head ordered by the physician's assistant however while this is normal it is the wrong test and inadequate to evaluate for nerve palsy.  Given the uncertainty I will obtain an MRI instead.  MRI read is actually quite equivocal and concerning for either Ramsay Hunt syndrome versus stroke.  I cleaned the patient's left ear canal and did not see any distinct lesions.  At this point I favor stroke and I offered the patient inpatient admission for full stroke work-up.  He declined stating he could see his primary care physician tomorrow which with an NIH of 1 I think is actually quite reasonable.  I will treat him for both his stroke with aspirin  and Lipitor as well as possible Ramsay Hunt with valacyclovir and steroids.  Strict return precautions have been given.      ____________________________________________   FINAL CLINICAL IMPRESSION(S) / ED DIAGNOSES  Final diagnoses:  Cerebrovascular accident (CVA), unspecified mechanism ([place])  Ramsay Hunt auricular syndrome      NEW MEDICATIONS STARTED DURING THIS VISIT:  Discharge Medication List as of 12/02/2017  4:10 PM    START taking these medications   Details  atorvastatin (LIPITOR) 80 MG tablet Take 1 tablet (80 mg total) by mouth at bedtime., Starting Thu 12/02/2017, Until Fri 12/02/2018, Print    predniSONE (DELTASONE) 50 MG tablet Take 1 tablet (50 mg total) by mouth daily for 6 days., Starting Thu 12/02/2017, Until Wed 12/08/2017, Print    valACYclovir (VALTREX) 1000 MG tablet Take 2 tablets (2,000 mg total) by mouth 3 (three) times daily for 7 days., Starting Thu 12/02/2017, Until Thu  12/09/2017, Print         Note:  This document was prepared using Dragon voice recognition software and may include unintentional dictation errors.     Darel Hong, MD 12/04/17 [phone]

## 2017-12-02 NOTE — ED Notes (Signed)
D/w Dr. Alfred Levins, no new orders at this time, states let pt be seen before ordering any labs.

## 2017-12-02 NOTE — ED Notes (Signed)
Pt taken to MRI  

## 2017-12-02 NOTE — Discharge Instructions (Signed)
Today your MRI was equivocal and I recommended you be admitted to the hospital to fully evaluate your stroke risk however you declined.  Please begin taking baby aspirin every single day along with Lipitor for your cholesterol and to take your antiviral medication for the next 7 days.  The most important thing is that you keep your eye patched shut at night because you are at high risk for scratching her eyeball.  These return to the emergency department for any concerns.  It was a pleasure to take care of you today, and thank you for coming to our emergency department.  If you have any questions or concerns before leaving please ask the nurse to grab me and I'm more than happy to go through your aftercare instructions again.  If you were prescribed any opioid pain medication today such as Norco, Vicodin, Percocet, morphine, hydrocodone, or oxycodone please make sure you do not drive when you are taking this medication as it can alter your ability to drive safely.  If you have any concerns once you are home that you are not improving or are in fact getting worse before you can make it to your follow-up appointment, please do not hesitate to call 911 and come back for further evaluation.  Darel Hong, MD  Results for orders placed or performed during the hospital encounter of 11/29/15  CBC with Differential/Platelet  Result Value Ref Stock   WBC 9.9 3.8 - 10.6 K/uL   RBC 4.49 4.40 - 5.90 MIL/uL   Hemoglobin 15.3 13.0 - 18.0 g/dL   HCT 43.1 40.0 - 52.0 %   MCV 96.0 80.0 - 100.0 fL   MCH 34.2 (H) 26.0 - 34.0 pg   MCHC 35.6 32.0 - 36.0 g/dL   RDW 13.2 11.5 - 14.5 %   Platelets 174 150 - 440 K/uL   Neutrophils Relative % 67 %   Neutro Abs 6.7 (H) 1.4 - 6.5 K/uL   Lymphocytes Relative 22 %   Lymphs Abs 2.2 1.0 - 3.6 K/uL   Monocytes Relative 8 %   Monocytes Absolute 0.8 0.2 - 1.0 K/uL   Eosinophils Relative 2 %   Eosinophils Absolute 0.2 0 - 0.7 K/uL   Basophils Relative 1 %   Basophils  Absolute 0.1 0 - 0.1 K/uL  Basic metabolic panel  Result Value Ref Hypes   Sodium 131 (L) 135 - 145 mmol/L   Potassium 3.8 3.5 - 5.1 mmol/L   Chloride 98 (L) 101 - 111 mmol/L   CO2 22 22 - 32 mmol/L   Glucose, Bld 106 (H) 65 - 99 mg/dL   BUN 12 6 - 20 mg/dL   Creatinine, Ser 0.74 0.61 - 1.24 mg/dL   Calcium 9.2 8.9 - 10.3 mg/dL   GFR calc non Af Amer >60 >60 mL/min   GFR calc Af Amer >60 >60 mL/min   Anion gap 11 5 - 15  Protime-INR  Result Value Ref Stitely   Prothrombin Time 13.9 11.4 - 15.2 seconds   INR 1.07   CBC with Differential/Platelet  Result Value Ref Gural   WBC 11.0 (H) 3.8 - 10.6 K/uL   RBC 3.78 (L) 4.40 - 5.90 MIL/uL   Hemoglobin 13.1 13.0 - 18.0 g/dL   HCT 37.3 (L) 40.0 - 52.0 %   MCV 98.5 80.0 - 100.0 fL   MCH 34.5 (H) 26.0 - 34.0 pg   MCHC 35.0 32.0 - 36.0 g/dL   RDW 13.6 11.5 - 14.5 %   Platelets  145 (L) 150 - 440 K/uL   Neutrophils Relative % 79 %   Neutro Abs 8.7 (H) 1.4 - 6.5 K/uL   Lymphocytes Relative 10 %   Lymphs Abs 1.1 1.0 - 3.6 K/uL   Monocytes Relative 7 %   Monocytes Absolute 0.8 0.2 - 1.0 K/uL   Eosinophils Relative 2 %   Eosinophils Absolute 0.2 0 - 0.7 K/uL   Basophils Relative 2 %   Basophils Absolute 0.2 (H) 0 - 0.1 K/uL  Basic metabolic panel  Result Value Ref Bantz   Sodium 133 (L) 135 - 145 mmol/L   Potassium 3.5 3.5 - 5.1 mmol/L   Chloride 101 101 - 111 mmol/L   CO2 24 22 - 32 mmol/L   Glucose, Bld 151 (H) 65 - 99 mg/dL   BUN 10 6 - 20 mg/dL   Creatinine, Ser 0.63 0.61 - 1.24 mg/dL   Calcium 8.2 (L) 8.9 - 10.3 mg/dL   GFR calc non Af Amer >60 >60 mL/min   GFR calc Af Amer >60 >60 mL/min   Anion gap 8 5 - 15  Type and screen  Result Value Ref Radigan   ABO/RH(D) A POS    Antibody Screen NEG    Sample Expiration 12/02/2015    Ct Head Wo Contrast  Result Date: 12/02/2017 CLINICAL DATA:  72 year old male with LEFT-sided facial numbness for 1 day. EXAM: CT HEAD WITHOUT CONTRAST TECHNIQUE: Contiguous axial images were obtained  from the base of the skull through the vertex without intravenous contrast. COMPARISON:  None. FINDINGS: Brain: No evidence of acute infarction, hemorrhage, hydrocephalus, extra-axial collection or mass lesion/mass effect. Mild probable chronic small-vessel white matter ischemic changes noted. Vascular: Carotid atherosclerotic calcifications noted. Skull: Normal. Negative for fracture or focal lesion. Sinuses/Orbits: Small amount of fluid in the RIGHT maxillary sinus noted. Near complete opacification/mucosal thickening of bilateral ethmoid air cells and RIGHT frontal sinus noted. Other: None IMPRESSION: 1. No evidence of acute intracranial abnormality 2. Mild probable chronic small-vessel white matter ischemic changes 3. Chronic paranasal sinus disease/sinusitis with acute component/fluid within the RIGHT maxillary sinus Electronically Signed   By: Margarette Canada M.D.   On: 12/02/2017 12:47   Mr Brain Wo Contrast (neuro Protocol)  Result Date: 12/02/2017 CLINICAL DATA:  Focal neuro deficit. Left-sided facial numbness since yesterday. EXAM: MRI HEAD WITHOUT CONTRAST TECHNIQUE: Multiplanar, multiecho pulse sequences of the brain and surrounding structures were obtained without intravenous contrast. COMPARISON:  Head CT from earlier today FINDINGS: Brain: Band of increased T2 and DWI signal along the junction of the left pons and middle cerebellar peduncle, roughly pointing at the internal auditory canal. Although infarct is a distinct possibility given age and smoking history, the orientation and proximity to the facial nerve nucleus and pathway this may be a manifestation of infection, as with Ramsay Hunt. If clinically needed, postcontrast imaging with IAC protocol may be helpful. There is mild supratentorial white matter FLAIR hyperintensity with nonspecific pattern. Small FLAIR hyperintensity about the right aspect of the fourth ventricle; demyelinating syndrome is considered unlikely given demographics. No acute  hemorrhage, hydrocephalus, or masslike finding Vascular: Major flow voids are preserved Skull and upper cervical spine: No evidence of marrow lesion Sinuses/Orbits: Extensive ethmoid sinusitis. Right frontal sinusitis with mucosal thickening. Right maxillary fluid level. IMPRESSION: 1. 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 Small-vessel infarct is the main differential consideration. Please correlate for eruption. Postcontrast imaging with IAC slices may  be useful if clinically needed. 2. Sinusitis including right maxillary fluid level. Electronically Signed   By: Monte Fantasia M.D.   On: 12/02/2017 15:46

## 2018-04-22 ENCOUNTER — Telehealth: Payer: Self-pay | Admitting: *Deleted

## 2018-04-22 NOTE — Telephone Encounter (Signed)
Left message for patient to notify them that it is time to schedule annual low dose lung cancer screening CT scan. Instructed patient to call back to verify information prior to the scan being scheduled.  

## 2018-04-25 ENCOUNTER — Telehealth: Payer: Self-pay | Admitting: *Deleted

## 2018-04-25 DIAGNOSIS — Z122 Encounter for screening for malignant neoplasm of respiratory organs: Secondary | ICD-10-CM

## 2018-04-25 DIAGNOSIS — Z87891 Personal history of nicotine dependence: Secondary | ICD-10-CM

## 2018-04-25 NOTE — Telephone Encounter (Signed)
Patient has been notified that annual lung cancer screening low dose CT scan is due currently or will be in near future. Confirmed that patient is within the age [place] of 55-77, and asymptomatic, (no signs or symptoms of lung cancer). Patient denies illness that would prevent curative treatment for lung cancer if found. Verified smoking history, (current, 52 pack year). The shared decision making visit was done 04/14/16. Patient is agreeable for CT scan being scheduled.

## 2018-05-06 ENCOUNTER — Ambulatory Visit
Admission: RE | Admit: 2018-05-06 | Discharge: 2018-05-06 | Disposition: A | Discharge status: Home / Self Care | Payer: Medicare Other | Source: Ambulatory Visit | Attending: Nurse Practitioner | Admitting: Nurse Practitioner

## 2018-05-06 DIAGNOSIS — Z87891 Personal history of nicotine dependence: Secondary | ICD-10-CM

## 2018-05-06 DIAGNOSIS — Z122 Encounter for screening for malignant neoplasm of respiratory organs: Secondary | ICD-10-CM | POA: Diagnosis not present

## 2018-05-09 ENCOUNTER — Encounter: Payer: Self-pay | Admitting: *Deleted

## 2019-05-02 ENCOUNTER — Telehealth: Payer: Self-pay | Admitting: *Deleted

## 2019-05-02 DIAGNOSIS — Z87891 Personal history of nicotine dependence: Secondary | ICD-10-CM

## 2019-05-02 NOTE — Telephone Encounter (Signed)
Patient has been notified that annual lung cancer screening low dose CT scan is due currently or will be in near future. Confirmed that patient is within the age [place] of 55-77, and asymptomatic, (no signs or symptoms of lung cancer). Patient denies illness that would prevent curative treatment for lung cancer if found. Verified smoking history, (current, 53 pack year). The shared decision making visit was done 04/14/16. Patient is agreeable for CT scan being scheduled.

## 2019-05-12 ENCOUNTER — Other Ambulatory Visit: Payer: Self-pay

## 2019-05-12 ENCOUNTER — Ambulatory Visit
Admission: RE | Admit: 2019-05-12 | Discharge: 2019-05-12 | Disposition: A | Discharge status: Home / Self Care | Payer: Medicare Other | Source: Ambulatory Visit | Attending: Oncology | Admitting: Oncology

## 2019-05-12 DIAGNOSIS — Z87891 Personal history of nicotine dependence: Secondary | ICD-10-CM | POA: Diagnosis not present

## 2019-05-15 ENCOUNTER — Encounter: Payer: Self-pay | Admitting: *Deleted

## 2019-05-19 ENCOUNTER — Ambulatory Visit: Payer: Medicare Other | Attending: Internal Medicine

## 2019-05-19 DIAGNOSIS — Z23 Encounter for immunization: Secondary | ICD-10-CM | POA: Insufficient documentation

## 2019-06-09 ENCOUNTER — Ambulatory Visit: Payer: Medicare Other | Attending: Internal Medicine

## 2019-06-09 DIAGNOSIS — Z23 Encounter for immunization: Secondary | ICD-10-CM | POA: Insufficient documentation

## 2019-06-09 NOTE — Progress Notes (Signed)
   Covid-19 Vaccination Clinic  Name:  Jordan Barber    MRN: [id number] DOB: [birth year]  06/09/2019  [name] was observed post Covid-19 immunization for 15 minutes without incidence. He was provided with Vaccine Information Sheet and instruction to access the V-Safe system.   [name] was instructed to call 911 with any severe reactions post vaccine: Marland Kitchen Difficulty breathing  . Swelling of your face and throat  . A fast heartbeat  . A bad rash all over your body  . Dizziness and weakness    Immunizations Administered    Name Date Dose VIS Date Route   Pfizer COVID-19 Vaccine 06/09/2019  1:40 PM 0.3 mL 04/07/2019 Intramuscular   Manufacturer: [place]   Lot: [id number]   [place]: [id number]

## 2020-01-23 ENCOUNTER — Ambulatory Visit: Payer: Medicare Other | Attending: Internal Medicine

## 2020-01-23 DIAGNOSIS — Z23 Encounter for immunization: Secondary | ICD-10-CM

## 2020-01-23 NOTE — Progress Notes (Signed)
   Covid-19 Vaccination Clinic  Name:  ISAACK PREBLE    MRN: [id number] DOB: [birth year]  01/23/2020  Mr. Polsky was observed post Covid-19 immunization for 15 minutes without incident. He was provided with Vaccine Information Sheet and instruction to access the V-Safe system.   Mr. Scalise was instructed to call 911 with any severe reactions post vaccine: Marland Kitchen Difficulty breathing  . Swelling of face and throat  . A fast heartbeat  . A bad rash all over body  . Dizziness and weakness

## 2020-05-02 ENCOUNTER — Other Ambulatory Visit: Payer: Self-pay | Admitting: *Deleted

## 2020-05-02 DIAGNOSIS — Z122 Encounter for screening for malignant neoplasm of respiratory organs: Secondary | ICD-10-CM

## 2020-05-02 DIAGNOSIS — Z87891 Personal history of nicotine dependence: Secondary | ICD-10-CM

## 2020-05-02 NOTE — Progress Notes (Signed)
Contacted and scheduled for annual lung screening scan. Patient is a current smoker with a 54 pack year history.  

## 2020-05-13 ENCOUNTER — Other Ambulatory Visit: Payer: Self-pay

## 2020-05-13 ENCOUNTER — Ambulatory Visit
Admission: RE | Admit: 2020-05-13 | Discharge: 2020-05-13 | Disposition: A | Discharge status: Home / Self Care | Payer: Medicare Other | Source: Ambulatory Visit | Attending: Oncology | Admitting: Oncology

## 2020-05-13 DIAGNOSIS — Z122 Encounter for screening for malignant neoplasm of respiratory organs: Secondary | ICD-10-CM

## 2020-05-13 DIAGNOSIS — Z87891 Personal history of nicotine dependence: Secondary | ICD-10-CM | POA: Diagnosis not present

## 2020-05-16 ENCOUNTER — Encounter: Payer: Self-pay | Admitting: *Deleted

## 2021-02-08 ENCOUNTER — Other Ambulatory Visit: Payer: Self-pay

## 2021-02-08 ENCOUNTER — Emergency Department: Payer: Medicare Other

## 2021-02-08 ENCOUNTER — Encounter: Payer: Self-pay | Admitting: Physician Assistant

## 2021-02-08 ENCOUNTER — Inpatient Hospital Stay
Admission: EM | Admit: 2021-02-08 | Discharge: 2021-02-14 | DRG: 480 | Disposition: A | Discharge status: Skilled Nursing Facility | Payer: Medicare Other | Attending: Internal Medicine | Admitting: Internal Medicine

## 2021-02-08 DIAGNOSIS — I10 Essential (primary) hypertension: Secondary | ICD-10-CM | POA: Diagnosis present

## 2021-02-08 DIAGNOSIS — Y92410 Unspecified street and highway as the place of occurrence of the external cause: Secondary | ICD-10-CM

## 2021-02-08 DIAGNOSIS — D62 Acute posthemorrhagic anemia: Secondary | ICD-10-CM

## 2021-02-08 DIAGNOSIS — D638 Anemia in other chronic diseases classified elsewhere: Secondary | ICD-10-CM | POA: Diagnosis present

## 2021-02-08 DIAGNOSIS — J9601 Acute respiratory failure with hypoxia: Secondary | ICD-10-CM | POA: Diagnosis present

## 2021-02-08 DIAGNOSIS — Z20822 Contact with and (suspected) exposure to covid-19: Secondary | ICD-10-CM | POA: Diagnosis present

## 2021-02-08 DIAGNOSIS — E871 Hypo-osmolality and hyponatremia: Secondary | ICD-10-CM | POA: Diagnosis present

## 2021-02-08 DIAGNOSIS — Z23 Encounter for immunization: Secondary | ICD-10-CM | POA: Diagnosis present

## 2021-02-08 DIAGNOSIS — S52591A Other fractures of lower end of right radius, initial encounter for closed fracture: Secondary | ICD-10-CM | POA: Diagnosis not present

## 2021-02-08 DIAGNOSIS — S72009A Fracture of unspecified part of neck of unspecified femur, initial encounter for closed fracture: Secondary | ICD-10-CM | POA: Diagnosis present

## 2021-02-08 DIAGNOSIS — S62101A Fracture of unspecified carpal bone, right wrist, initial encounter for closed fracture: Secondary | ICD-10-CM | POA: Diagnosis not present

## 2021-02-08 DIAGNOSIS — S72001A Fracture of unspecified part of neck of right femur, initial encounter for closed fracture: Secondary | ICD-10-CM | POA: Diagnosis present

## 2021-02-08 DIAGNOSIS — Z8249 Family history of ischemic heart disease and other diseases of the circulatory system: Secondary | ICD-10-CM

## 2021-02-08 DIAGNOSIS — Z8546 Personal history of malignant neoplasm of prostate: Secondary | ICD-10-CM

## 2021-02-08 DIAGNOSIS — S72141A Displaced intertrochanteric fracture of right femur, initial encounter for closed fracture: Principal | ICD-10-CM | POA: Diagnosis present

## 2021-02-08 DIAGNOSIS — Z751 Person awaiting admission to adequate facility elsewhere: Secondary | ICD-10-CM

## 2021-02-08 DIAGNOSIS — F17210 Nicotine dependence, cigarettes, uncomplicated: Secondary | ICD-10-CM | POA: Diagnosis present

## 2021-02-08 DIAGNOSIS — E538 Deficiency of other specified B group vitamins: Secondary | ICD-10-CM | POA: Diagnosis present

## 2021-02-08 DIAGNOSIS — Z79899 Other long term (current) drug therapy: Secondary | ICD-10-CM | POA: Diagnosis not present

## 2021-02-08 DIAGNOSIS — Y93K1 Activity, walking an animal: Secondary | ICD-10-CM

## 2021-02-08 DIAGNOSIS — E860 Dehydration: Secondary | ICD-10-CM | POA: Diagnosis present

## 2021-02-08 DIAGNOSIS — E785 Hyperlipidemia, unspecified: Secondary | ICD-10-CM | POA: Diagnosis present

## 2021-02-08 DIAGNOSIS — J439 Emphysema, unspecified: Secondary | ICD-10-CM | POA: Diagnosis present

## 2021-02-08 DIAGNOSIS — W1830XA Fall on same level, unspecified, initial encounter: Secondary | ICD-10-CM | POA: Diagnosis present

## 2021-02-08 DIAGNOSIS — Z7982 Long term (current) use of aspirin: Secondary | ICD-10-CM | POA: Diagnosis not present

## 2021-02-08 DIAGNOSIS — T148XXA Other injury of unspecified body region, initial encounter: Secondary | ICD-10-CM

## 2021-02-08 DIAGNOSIS — S52571A Other intraarticular fracture of lower end of right radius, initial encounter for closed fracture: Secondary | ICD-10-CM | POA: Diagnosis present

## 2021-02-08 DIAGNOSIS — I700 Atherosclerosis of aorta: Secondary | ICD-10-CM

## 2021-02-08 HISTORY — DX: Tobacco use: Z72.0

## 2021-02-08 HISTORY — DX: Fracture of unspecified part of neck of right femur, initial encounter for closed fracture: S72.001A

## 2021-02-08 HISTORY — DX: Unspecified fall, initial encounter: W19.XXXA

## 2021-02-08 LAB — BASIC METABOLIC PANEL
Anion gap: 6 (ref 5–15)
BUN: 14 mg/dL (ref 8–23)
CO2: 28 mmol/L (ref 22–32)
Calcium: 9.1 mg/dL (ref 8.9–10.3)
Chloride: 97 mmol/L — ABNORMAL LOW (ref 98–111)
Creatinine, Ser: 0.74 mg/dL (ref 0.61–1.24)
GFR, Estimated: >60 mL/min (ref >60)
Glucose, Bld: 100 mg/dL — ABNORMAL HIGH (ref 70–99)
Potassium: 4.1 mmol/L (ref 3.5–5.1)
Sodium: 131 mmol/L — ABNORMAL LOW (ref 135–145)

## 2021-02-08 LAB — RESP PANEL BY RT-PCR (FLU A&B, COVID) ARPGX2
Influenza A by PCR: NEGATIVE
Influenza B by PCR: NEGATIVE
SARS Coronavirus 2 by RT PCR: NEGATIVE

## 2021-02-08 LAB — CBC WITH DIFFERENTIAL/PLATELET
Abs Immature Granulocytes: 0.05 10*3/uL (ref 0.00–0.07)
Basophils Absolute: 0.1 10*3/uL (ref 0.0–0.1)
Basophils Relative: 1 %
Eosinophils Absolute: 0.4 10*3/uL (ref 0.0–0.5)
Eosinophils Relative: 4 %
HCT: 41.5 % (ref 39.0–52.0)
Hemoglobin: 15 g/dL (ref 13.0–17.0)
Immature Granulocytes: 1 %
Lymphocytes Relative: 32 %
Lymphs Abs: 3.1 10*3/uL (ref 0.7–4.0)
MCH: 34.7 pg — ABNORMAL HIGH (ref 26.0–34.0)
MCHC: 36.1 g/dL — ABNORMAL HIGH (ref 30.0–36.0)
MCV: 96.1 fL (ref 80.0–100.0)
Monocytes Absolute: 0.9 10*3/uL (ref 0.1–1.0)
Monocytes Relative: 9 %
Neutro Abs: 5.3 10*3/uL (ref 1.7–7.7)
Neutrophils Relative %: 53 %
Platelets: 207 10*3/uL (ref 150–400)
RBC: 4.32 MIL/uL (ref 4.22–5.81)
RDW: 12.5 % (ref 11.5–15.5)
WBC: 9.8 10*3/uL (ref 4.0–10.5)
nRBC: 0 % (ref 0.0–0.2)

## 2021-02-08 MED ORDER — HYDRALAZINE HCL 20 MG/ML IJ SOLN: 10.0000 mg | Freq: Four times a day (QID) | INTRAMUSCULAR | Status: DC | PRN

## 2021-02-08 MED ORDER — HYDRALAZINE HCL 20 MG/ML IJ SOLN: 10.0000 mg | Freq: Four times a day (QID) | INTRAMUSCULAR | Status: DC

## 2021-02-08 MED ORDER — SODIUM CHLORIDE 0.9 % IV SOLN: INTRAVENOUS | Status: DC

## 2021-02-08 MED ORDER — MORPHINE SULFATE (PF) 2 MG/ML IV SOLN
0.5000 mg | INTRAVENOUS | Status: DC | PRN
Start: 2021-02-08 — End: 2021-02-09
  Administered 2021-02-09 (×2): 0.5 mg via INTRAVENOUS
  Filled 2021-02-08 (×2): qty 1

## 2021-02-08 MED ORDER — HYDROMORPHONE HCL 1 MG/ML IJ SOLN
INTRAMUSCULAR | Status: AC
  Administered 2021-02-08: 1 mg via INTRAVENOUS
  Filled 2021-02-08: qty 1

## 2021-02-08 MED ORDER — HYDRALAZINE HCL 20 MG/ML IJ SOLN
10.0000 mg | Freq: Four times a day (QID) | INTRAMUSCULAR | Status: DC
  Administered 2021-02-08: 10 mg via INTRAVENOUS
  Filled 2021-02-08 (×5): qty 1

## 2021-02-08 MED ORDER — HYDROMORPHONE HCL 1 MG/ML IJ SOLN
1.0000 mg | INTRAMUSCULAR | Status: DC | PRN
  Administered 2021-02-09: 1 mg via INTRAVENOUS
  Filled 2021-02-08: qty 1

## 2021-02-08 MED ORDER — CEFAZOLIN SODIUM-DEXTROSE 2-4 GM/100ML-% IV SOLN
2.0000 g | INTRAVENOUS | Status: AC
  Administered 2021-02-09: 2 g via INTRAVENOUS
  Filled 2021-02-08 (×2): qty 100

## 2021-02-08 NOTE — ED Notes (Signed)
Lisa NT placing splint now. Wife remains at bedside.

## 2021-02-08 NOTE — ED Triage Notes (Signed)
Pt in via EMS from home due to fall resulting in R hip pain and swelling. IV placed by EMS and 50 of fentanyl given by EMS. 195/108 HTN; 97% RA; BG 120; 87HR; denies hitting head and LOC; pt caught self with hand as he fell. R wrist pain. Pt A&Ox4.

## 2021-02-08 NOTE — ED Notes (Signed)
Blood work sent to lab.

## 2021-02-08 NOTE — ED Notes (Signed)
Pt states dog on leash accidentally pulled too hard making him fall down. Dorsalis pedis pulse 2+ at both L and R foot. Radial pulse at R wrist 2+. Pt can wiggle toes but cannot shift R leg without inc pain.

## 2021-02-08 NOTE — H&P (Signed)
History and Physical    Jordan Barber [id number] DOB: 1945-12-19 DOA: 02/08/2021  PCP: Albina Billet, MD    Patient coming from:  Home   Chief Complaint:  Fall   HPI: Jordan Barber is a 75 y.o. male seen in ed with complaints of fall while walking his pet dog pulled on the leash.  + Patient tried to catch himself and did not hit his head or lose consciousness.  Reports pain in the right wrist and right hip.about 8 pm was walking dog up the street, while walking and his dog became hyper when he saw the neighbors dog and he pulled the leash and pulled him and he fell. Pt fell in the street then , he dragged himself over to the  driveway and then could not get up because of pain. Neighbor got wife who was three doors down and then called EMS. Pt was on his right side.   Pt has past medical history of history of prostate cancer, tobacco abuse.  ED Course:  Vitals:   02/09/21 2015 02/09/21 2017 02/09/21 2046 02/09/21 2246  BP: 127/70  121/80 122/72  Pulse: 80 81 86 83  Resp: 19 17 18 20   Temp:   98.4 F (36.9 C) 98.5 F (36.9 C)  TempSrc:      SpO2: (!) 88% 91% 92% 95%  Weight:      Height:      In the emergency room patient is alert awake oriented hypertensive. Initial BMP shows sodium 131 glucose 100, normal creatinine of 0.74 normal CBC with white count of 9.8 hemoglobin 15 and platelet count of 207.  Respiratory panel negative for influenza and COVID.  X-rays shows right radius and right hip fracture.  The orthopedic has been consulted by EDMD Dr. Roland Rack on-call orthopedics.  Review of Systems:  Review of Systems  Constitutional: Negative.   HENT: Negative.    Eyes: Negative.   Respiratory: Negative.    Cardiovascular: Negative.   Gastrointestinal: Negative.   Genitourinary: Negative.   Musculoskeletal:  Positive for falls and joint pain.  Skin: Negative.   Neurological: Negative.   Endo/Heme/Allergies: Negative.   Psychiatric/Behavioral: Negative.      Past Medical  History:  Diagnosis Date   Fall    Hip fracture, right, closed, initial encounter ([place]) 02/08/2021   Prostate cancer ([place])    Tobacco abuse     Past Surgical History:  Procedure Laterality Date   COLONOSCOPY  10/18/2006   COLONOSCOPY WITH PROPOFOL N/A 02/03/2017   Procedure: COLONOSCOPY WITH PROPOFOL;  Surgeon: Robert Bellow, MD;  Location: [facility] ENDOSCOPY;  Service: Endoscopy;  Laterality: N/A;   ORIF TIBIA PLATEAU Left 11/29/2015   Procedure: OPEN REDUCTION INTERNAL FIXATION (ORIF) TIBIAL PLATEAU;  Surgeon: Corky Mull, MD;  Location: [facility] ORS;  Service: Orthopedics;  Laterality: Left;   PROSTATE SURGERY  2011     reports that he has been smoking cigarettes. He has a 50.00 pack-year smoking history. He has never used smokeless tobacco. He reports current alcohol use. He reports that he does not use drugs.  No Known Allergies  Family History  Problem Relation Age of Onset   Dementia Mother    Heart disease Father     Prior to Admission medications   Medication Sig Start Date End Date Taking? Authorizing Provider  aspirin EC 81 MG tablet Take 81 mg by mouth daily.    [provider]  atorvastatin (LIPITOR) 80 MG tablet Take 1 tablet (80 mg  total) by mouth at bedtime. 12/02/17 12/02/18  Darel Hong, MD  clobetasol ointment (TEMOVATE) 5.63 % Apply 1 application topically 2 (two) times daily.    [provider]    Physical Exam: Vitals:   02/09/21 2015 02/09/21 2017 02/09/21 2046 02/09/21 2246  BP: 127/70  121/80 122/72  Pulse: 80 81 86 83  Resp: 19 17 18 20   Temp:   98.4 F (36.9 C) 98.5 F (36.9 C)  TempSrc:      SpO2: (!) 88% 91% 92% 95%  Weight:      Height:       Physical Exam Vitals and nursing note reviewed.  Constitutional:      Appearance: He is obese.  HENT:     Head: Normocephalic and atraumatic.     Right Ear: External ear normal.     Left Ear: External ear normal.     Nose: Nose normal.     Mouth/Throat:     Mouth: Mucous  membranes are moist.  Eyes:     Extraocular Movements: Extraocular movements intact.     Pupils: Pupils are equal, round, and reactive to light.  Neck:     Vascular: No carotid bruit.  Cardiovascular:     Rate and Rhythm: Normal rate and regular rhythm.     Pulses: Normal pulses.     Heart sounds: Normal heart sounds.  Pulmonary:     Effort: Pulmonary effort is normal.     Breath sounds: Normal breath sounds.  Abdominal:     General: Bowel sounds are normal. There is no distension.     Palpations: Abdomen is soft. There is no mass.     Tenderness: There is no abdominal tenderness. There is no guarding.     Hernia: No hernia is present.  Musculoskeletal:     Right lower leg: No edema.     Left lower leg: No edema.  Skin:    General: Skin is warm.  Neurological:     General: No focal deficit present.     Mental Status: He is alert and oriented to person, place, and time.  Psychiatric:        Mood and Affect: Mood normal.        Behavior: Behavior normal.    Labs on Admission: I have personally reviewed following labs and imaging studies   COVID-19 Labs No results for input(s): DDIMER, FERRITIN, LDH, CRP in the last 72 hours. Lab Results  Component Value Date   Julian NEGATIVE 02/08/2021    Radiological Exams on Admission: DG Wrist Complete Right  Result Date: 02/08/2021 CLINICAL DATA:  Fall EXAM: RIGHT WRIST - COMPLETE 3+ VIEW COMPARISON:  None. FINDINGS: Mildly anteriorly displaced fracture of the distal right radius, extra-articular. Moderate soft tissue swelling. No ulna fracture. IMPRESSION: Mildly anteriorly displaced extra-articular fracture of the distal right radius. Electronically Signed   By: Ulyses Jarred M.D.   On: 02/08/2021 21:51   DG Chest Portable 1 View  Result Date: 02/08/2021 CLINICAL DATA:  Fall EXAM: PORTABLE CHEST 1 VIEW COMPARISON:  06/09/2011 FINDINGS: The heart size and mediastinal contours are within normal limits. Both lungs are clear.  The visualized skeletal structures are unremarkable. IMPRESSION: No active disease. Electronically Signed   By: Ulyses Jarred M.D.   On: 02/08/2021 22:07   DG C-Arm 1-60 Min  Result Date: 02/09/2021 CLINICAL DATA:  Right femoral nail EXAM: DG C-ARM 1-60 MIN; RIGHT FEMUR 2 VIEWS FLUOROSCOPY TIME:  Fluoroscopy Time:  41 seconds Radiation Exposure Index (  if provided by the fluoroscopic device): 7.3 mGy COMPARISON:  None. FINDINGS: Intramedullary rod and nail fixation of the right femur spanning inter trochanteric fracture. Single distal interlocking screw. IMPRESSION: Fluoroscopic guidance for right femur fracture fixation. Electronically Signed   By: Macy Mis M.D.   On: 02/09/2021 17:31   DG MINI C-ARM IMAGE ONLY  Result Date: 02/09/2021 There is no interpretation for this exam.  This order is for images obtained during a surgical procedure.  Please See "Surgeries" Tab for more information regarding the procedure.   DG Hip Unilat W or Wo Pelvis 2-3 Views Right  Result Date: 02/08/2021 CLINICAL DATA:  Fall EXAM: DG HIP (WITH OR WITHOUT PELVIS) 2-3V RIGHT COMPARISON:  None. FINDINGS: Minimally displaced intertrochanteric fracture of the proximal right femur. No other pelvic fracture. IMPRESSION: Minimally displaced intertrochanteric fracture of the proximal right femur. Electronically Signed   By: Ulyses Jarred M.D.   On: 02/08/2021 21:52   DG FEMUR, MIN 2 VIEWS RIGHT  Result Date: 02/09/2021 CLINICAL DATA:  Right femoral nail EXAM: DG C-ARM 1-60 MIN; RIGHT FEMUR 2 VIEWS FLUOROSCOPY TIME:  Fluoroscopy Time:  41 seconds Radiation Exposure Index (if provided by the fluoroscopic device): 7.3 mGy COMPARISON:  None. FINDINGS: Intramedullary rod and nail fixation of the right femur spanning inter trochanteric fracture. Single distal interlocking screw. IMPRESSION: Fluoroscopic guidance for right femur fracture fixation. Electronically Signed   By: Macy Mis M.D.   On: 02/09/2021 17:31    EKG:  Independently reviewed.  None   Assessment/Plan Principal Problem:   Hip fracture, right, closed, initial encounter [facility]) Active Problems:   Other fractures of lower end of right radius, initial encounter for closed fracture   Cigarette smoker   Hyponatremia   Aortic atherosclerosis (HCC)   Right hip fracture: Closed right hip fracture, bedrest, as needed pain control. Occurred secondary to mechanical trauma with no syncope or presyncope symptoms. Patient has no significant medical history other than hypertension dyslipidemia and tobacco abuse. Orthopedics has been consulted and Anticipate surgery in the a.m.  Right radius fracture: Plan for surgery of right radius as well.  Splint overnight for immobilization and pain relief.    Tobacco abuse:  Counseled patient briefly on tobacco abuse and cessation and risks of ongoing smoking. Nicotine patch.  Hyponatremia: Mild hyponatremia along with hypochloremia attribute to dehydration. Continue patient on maintenance IV fluid overnight.    Aortic atherosclerosis: Related to hyperlipidemia and tobacco abuse: Consider statin therapy and a lipid panel postoperatively for medical optimization, and statin therapy.     DVT prophylaxis:  SCD's   Code Status:  Full code    Family Communication:  Sawaya,Donna (Spouse)  [phone] (Mobile)   Disposition Plan:  TBD   Consults called:  Orthopedic-Dr.Poggi.  Admission status: Inpatient     Para Skeans MD [facility] [phone] How to contact the [facility] Attending or Consulting provider [place] or covering provider during after hours [place], for this patient.    Check the care team in [facility] and look for a) attending/consulting [facility] provider listed and b) the [facility] team listed Log into [email or web address] and use [place]'s universal password to access. If you do not have the password, please contact the hospital operator. Locate the [facility] provider you are looking for under  [facility] and page to a number that you can be directly reached. If you still have difficulty reaching the provider, please page the [facility] (Director on Call) for the Hospitalists listed on amion for  assistance. [email or web address] Password [email or web address] 02/10/2021, 1:02 AM

## 2021-02-08 NOTE — ED Notes (Signed)
Pt's wife Nike Southwell updated at [phone]. Told welcome at bedside. States is down the road.

## 2021-02-08 NOTE — ED Notes (Signed)
Pt given food tray. Pt verbalized he will go NPO at 12am. Wife at bedside.

## 2021-02-08 NOTE — ED Provider Notes (Signed)
[facility] Emergency Department Provider Note ____________________________________________  Time seen: 2106  I have reviewed the triage vital signs and the nursing notes.  HISTORY  Chief Complaint  Hip Pain   HPI Jordan Barber is a 75 y.o. male with th below medical history, presents to the ED via EMS from home, patient presents following a mechanical fall.  He was apparently walking his dog who pulled his leash, causing the patient to fall.  He denies any head injury or LOC.  He presents with primary complaints of pain to the right wrist as well as the right hip. He denies blood thinner use.   Past Medical History:  Diagnosis Date   Fall    Prostate cancer [facility])    Tobacco abuse     Patient Active Problem List   Diagnosis Date Noted   Encounter for screening colonoscopy 12/15/2016   Personal history of tobacco use, presenting hazards to health 04/18/2016   Tibial plateau fracture, left 11/29/2015    Past Surgical History:  Procedure Laterality Date   COLONOSCOPY  10/18/2006   COLONOSCOPY WITH PROPOFOL N/A 02/03/2017   Procedure: COLONOSCOPY WITH PROPOFOL;  Surgeon: Robert Bellow, MD;  Location: [facility] ENDOSCOPY;  Service: Endoscopy;  Laterality: N/A;   ORIF TIBIA PLATEAU Left 11/29/2015   Procedure: OPEN REDUCTION INTERNAL FIXATION (ORIF) TIBIAL PLATEAU;  Surgeon: Corky Mull, MD;  Location: [facility] ORS;  Service: Orthopedics;  Laterality: Left;   PROSTATE SURGERY  2011    Prior to Admission medications   Medication Sig Start Date End Date Taking? Authorizing Provider  atorvastatin (LIPITOR) 80 MG tablet Take 1 tablet (80 mg total) by mouth at bedtime. 12/02/17 12/02/18  Darel Hong, MD  clobetasol ointment (TEMOVATE) 6.83 % Apply 1 application topically 2 (two) times daily.    [provider]    Allergies Patient has no known allergies.  Family History  Problem Relation Age of Onset   Dementia Mother    Heart disease Father      Social History Social History   Tobacco Use   Smoking status: Every Day    Packs/day: 1.00    Years: 50.00    Pack years: 50.00    Types: Cigarettes   Smokeless tobacco: Never  Vaping Use   Vaping Use: Never used  Substance Use Topics   Alcohol use: Yes    Comment: occasional beers   Drug use: No    Review of Systems  Constitutional: Negative for fever. Eyes: Negative for visual changes. ENT: Negative for sore throat. Cardiovascular: Negative for chest pain. Respiratory: Negative for shortness of breath. Gastrointestinal: Negative for abdominal pain, vomiting and diarrhea. Genitourinary: Negative for dysuria. Musculoskeletal: Negative for back pain.  Right wrist pain and right hip pain Skin: Negative for rash. Neurological: Negative for headaches, focal weakness or numbness. ____________________________________________  PHYSICAL EXAM:  VITAL SIGNS: ED Triage Vitals  Enc Vitals Group     BP --      Pulse --      Resp --      Temp 02/08/21 2104 98.1 F (36.7 C)     Temp Source 02/08/21 2104 Oral     SpO2 --      Weight 02/08/21 2105 152 lb (68.9 kg)     Height 02/08/21 2105 6' (1.829 m)     Head Circumference --      Peak Flow --      Pain Score 02/08/21 2104 2     Pain Loc --  Pain Edu? --      Excl. in [place]? --     Constitutional: Alert and oriented. Well appearing and in no distress. GCS = 15 Head: Normocephalic and atraumatic. Eyes: Conjunctivae are normal. Normal extraocular movements Neck: Supple. No thyromegaly. Cardiovascular: Normal rate, regular rhythm. Normal distal pulses. Respiratory: Normal respiratory effort. No wheezes/rales/rhonchi. Gastrointestinal: Soft and nontender. No distention. Musculoskeletal: right LE with tenderness to the greater trochanter. RLE with external rotation, but without obvious shortening. Right wrist with radial deformity and normal ROM noted. Nontender with normal Rossi of motion in all other extremities.   Neurologic:  Normal gait without ataxia. Normal speech and language. No gross focal neurologic deficits are appreciated. Skin:  Skin is warm, dry and intact. No rash noted. Psychiatric: Mood and affect are normal. Patient exhibits appropriate insight and judgment. ____________________________________________    {LABS (pertinent positives/negatives)  Labs Reviewed  BASIC METABOLIC PANEL - Abnormal; Notable for the following components:      Result Value   Sodium 131 (*)    Chloride 97 (*)    Glucose, Bld 100 (*)    All other components within normal limits  CBC WITH DIFFERENTIAL/PLATELET - Abnormal; Notable for the following components:   MCH 34.7 (*)    MCHC 36.1 (*)    All other components within normal limits  RESP PANEL BY RT-PCR (FLU A&B, COVID) [id number]  ____________________________________________  {EKG  ____________________________________________   RADIOLOGY Official radiology report(s): DG Wrist Complete Right  Result Date: 02/08/2021 CLINICAL DATA:  Fall EXAM: RIGHT WRIST - COMPLETE 3+ VIEW COMPARISON:  None. FINDINGS: Mildly anteriorly displaced fracture of the distal right radius, extra-articular. Moderate soft tissue swelling. No ulna fracture. IMPRESSION: Mildly anteriorly displaced extra-articular fracture of the distal right radius. Electronically Signed   By: Ulyses Jarred M.D.   On: 02/08/2021 21:51   DG Chest Portable 1 View  Result Date: 02/08/2021 CLINICAL DATA:  Fall EXAM: PORTABLE CHEST 1 VIEW COMPARISON:  06/09/2011 FINDINGS: The heart size and mediastinal contours are within normal limits. Both lungs are clear. The visualized skeletal structures are unremarkable. IMPRESSION: No active disease. Electronically Signed   By: Ulyses Jarred M.D.   On: 02/08/2021 22:07   DG Hip Unilat W or Wo Pelvis 2-3 Views Right  Result Date: 02/08/2021 CLINICAL DATA:  Fall EXAM: DG HIP (WITH OR WITHOUT PELVIS) 2-3V RIGHT COMPARISON:  None. FINDINGS: Minimally displaced  intertrochanteric fracture of the proximal right femur. No other pelvic fracture. IMPRESSION: Minimally displaced intertrochanteric fracture of the proximal right femur. Electronically Signed   By: Ulyses Jarred M.D.   On: 02/08/2021 21:52   ____________________________________________  PROCEDURES   Procedures ____________________________________________   INITIAL IMPRESSION / ASSESSMENT AND PLAN / ED COURSE  As part of my medical decision making, I reviewed the following data within the [place] reviewed WNL, Radiograph reviewed as noted, Discussed with admitting physician Florina Ou, MD, A consult was requested and obtained from this/these consultant(s) Orthopedics, and Notes from prior ED visits   Geriatric patient with ED evaluation initial fracture care of a closed right radial fracture as well as a closed right intertrochanteric hip fracture.  A volar OCL splint is placed on the right wrist, and patient is admitted to the hospital service.  Dr. Roland Rack has been contacted, and will plan surgical intervention in the morning.  Patient and his wife are agreeable to the plan of care.  They have been made aware of the diagnosis, reviewed films, and labs chance  to ask questions.  Patient is reporting that his pain is stable and to attend at this time.   Jordan Barber was evaluated in Emergency Department on 02/08/2021 for the symptoms described in the history of present illness. He was evaluated in the context of the global COVID-19 pandemic, which necessitated consideration that the patient might be at risk for infection with the SARS-CoV-2 virus that causes COVID-19. Institutional protocols and algorithms that pertain to the evaluation of patients at risk for COVID-19 are in a state of rapid change based on information released by regulatory bodies including the CDC and federal and state organizations. These policies and algorithms were followed during the patient's care in the  ED. ____________________________________________  FINAL CLINICAL IMPRESSION(S) / ED DIAGNOSES  Final diagnoses:  Right wrist fracture, closed, initial encounter  Closed fracture of femur, intertrochanteric, right, initial encounter [facility])      Paisly Fingerhut, Dannielle Karvonen, PA-C 02/08/21 2224    Duffy Bruce, MD 02/09/21 1510

## 2021-02-08 NOTE — ED Notes (Signed)
Lisa NT to place splint to R wrist soon.

## 2021-02-09 ENCOUNTER — Encounter
Admission: EM | Disposition: A | Discharge status: Skilled Nursing Facility | Payer: Self-pay | Source: Home / Self Care | Attending: Family Medicine

## 2021-02-09 ENCOUNTER — Inpatient Hospital Stay: Payer: Medicare Other | Admitting: Anesthesiology

## 2021-02-09 ENCOUNTER — Inpatient Hospital Stay: Payer: Medicare Other

## 2021-02-09 ENCOUNTER — Encounter: Payer: Self-pay | Admitting: Internal Medicine

## 2021-02-09 DIAGNOSIS — S52591A Other fractures of lower end of right radius, initial encounter for closed fracture: Secondary | ICD-10-CM

## 2021-02-09 DIAGNOSIS — I700 Atherosclerosis of aorta: Secondary | ICD-10-CM

## 2021-02-09 DIAGNOSIS — J439 Emphysema, unspecified: Secondary | ICD-10-CM | POA: Insufficient documentation

## 2021-02-09 DIAGNOSIS — S62101A Fracture of unspecified carpal bone, right wrist, initial encounter for closed fracture: Secondary | ICD-10-CM | POA: Diagnosis not present

## 2021-02-09 DIAGNOSIS — S72141A Displaced intertrochanteric fracture of right femur, initial encounter for closed fracture: Principal | ICD-10-CM

## 2021-02-09 DIAGNOSIS — F17210 Nicotine dependence, cigarettes, uncomplicated: Secondary | ICD-10-CM

## 2021-02-09 DIAGNOSIS — J432 Centrilobular emphysema: Secondary | ICD-10-CM | POA: Insufficient documentation

## 2021-02-09 DIAGNOSIS — E871 Hypo-osmolality and hyponatremia: Secondary | ICD-10-CM

## 2021-02-09 HISTORY — PX: INTRAMEDULLARY (IM) NAIL INTERTROCHANTERIC: SHX5875

## 2021-02-09 HISTORY — PX: OPEN REDUCTION INTERNAL FIXATION (ORIF) DISTAL RADIAL FRACTURE: SHX5989

## 2021-02-09 LAB — CBC
HCT: 42.8 % (ref 39.0–52.0)
Hemoglobin: 14.3 g/dL (ref 13.0–17.0)
MCH: 33.1 pg (ref 26.0–34.0)
MCHC: 33.4 g/dL (ref 30.0–36.0)
MCV: 99.1 fL (ref 80.0–100.0)
Platelets: 179 10*3/uL (ref 150–400)
RBC: 4.32 MIL/uL (ref 4.22–5.81)
RDW: 12.8 % (ref 11.5–15.5)
WBC: 12.6 10*3/uL — ABNORMAL HIGH (ref 4.0–10.5)
nRBC: 0 % (ref 0.0–0.2)

## 2021-02-09 LAB — BASIC METABOLIC PANEL
Anion gap: 6 (ref 5–15)
BUN: 12 mg/dL (ref 8–23)
CO2: 24 mmol/L (ref 22–32)
Calcium: 8.8 mg/dL — ABNORMAL LOW (ref 8.9–10.3)
Chloride: 98 mmol/L (ref 98–111)
Creatinine, Ser: 0.63 mg/dL (ref 0.61–1.24)
GFR, Estimated: >60 mL/min (ref >60)
Glucose, Bld: 107 mg/dL — ABNORMAL HIGH (ref 70–99)
Potassium: 3.9 mmol/L (ref 3.5–5.1)
Sodium: 128 mmol/L — ABNORMAL LOW (ref 135–145)

## 2021-02-09 LAB — SURGICAL PCR SCREEN
MRSA, PCR: NEGATIVE
Staphylococcus aureus: NEGATIVE

## 2021-02-09 SURGERY — FIXATION, FRACTURE, INTERTROCHANTERIC, WITH INTRAMEDULLARY ROD
Anesthesia: General | Site: Hip | Laterality: Right

## 2021-02-09 MED ORDER — SODIUM CHLORIDE 0.9 % IV SOLN: INTRAVENOUS | Status: DC

## 2021-02-09 MED ORDER — SODIUM CHLORIDE 0.9 % IV SOLN
INTRAVENOUS | Status: DC | PRN
  Administered 2021-02-09: 30 ug/min via INTRAVENOUS

## 2021-02-09 MED ORDER — MUPIROCIN 2 % EX OINT
1.0000 "application " | TOPICAL_OINTMENT | Freq: Two times a day (BID) | CUTANEOUS | Status: DC
  Filled 2021-02-09: qty 22

## 2021-02-09 MED ORDER — EPHEDRINE 5 MG/ML INJ
INTRAVENOUS | Status: AC
  Filled 2021-02-09: qty 5

## 2021-02-09 MED ORDER — 0.9 % SODIUM CHLORIDE (POUR BTL) OPTIME
TOPICAL | Status: DC | PRN
  Administered 2021-02-09: 1000 mL

## 2021-02-09 MED ORDER — DIPHENHYDRAMINE HCL 12.5 MG/5ML PO ELIX: 12.5000 mg | ORAL_SOLUTION | ORAL | Status: DC | PRN

## 2021-02-09 MED ORDER — KETOROLAC TROMETHAMINE 15 MG/ML IJ SOLN: 15.0000 mg | Freq: Once | INTRAMUSCULAR | Status: DC

## 2021-02-09 MED ORDER — LIDOCAINE HCL (CARDIAC) PF 100 MG/5ML IV SOSY
PREFILLED_SYRINGE | INTRAVENOUS | Status: DC | PRN
  Administered 2021-02-09: 100 mg via INTRAVENOUS

## 2021-02-09 MED ORDER — ACETAMINOPHEN 500 MG PO TABS
500.0000 mg | ORAL_TABLET | Freq: Four times a day (QID) | ORAL | Status: AC
  Administered 2021-02-10 (×3): 500 mg via ORAL
  Filled 2021-02-09 (×4): qty 1

## 2021-02-09 MED ORDER — PROPOFOL 10 MG/ML IV BOLUS
INTRAVENOUS | Status: AC
  Filled 2021-02-09: qty 20

## 2021-02-09 MED ORDER — DEXAMETHASONE SODIUM PHOSPHATE 10 MG/ML IJ SOLN
INTRAMUSCULAR | Status: AC
  Filled 2021-02-09: qty 1

## 2021-02-09 MED ORDER — DOCUSATE SODIUM 100 MG PO CAPS
100.0000 mg | ORAL_CAPSULE | Freq: Two times a day (BID) | ORAL | Status: DC
  Administered 2021-02-09 – 2021-02-14 (×8): 100 mg via ORAL
  Filled 2021-02-09 (×8): qty 1

## 2021-02-09 MED ORDER — FLEET ENEMA 7-19 GM/118ML RE ENEM: 1.0000 | ENEMA | Freq: Once | RECTAL | Status: DC | PRN

## 2021-02-09 MED ORDER — ACETAMINOPHEN 10 MG/ML IV SOLN
INTRAVENOUS | Status: AC
  Filled 2021-02-09: qty 100

## 2021-02-09 MED ORDER — ACETAMINOPHEN 10 MG/ML IV SOLN
INTRAVENOUS | Status: DC | PRN
Start: 2021-02-09 — End: 2021-02-09
  Administered 2021-02-09: 1000 mg via INTRAVENOUS

## 2021-02-09 MED ORDER — FENTANYL CITRATE (PF) 100 MCG/2ML IJ SOLN
INTRAMUSCULAR | Status: AC
  Filled 2021-02-09: qty 2

## 2021-02-09 MED ORDER — LIDOCAINE HCL (PF) 2 % IJ SOLN
INTRAMUSCULAR | Status: AC
  Filled 2021-02-09: qty 5

## 2021-02-09 MED ORDER — METOCLOPRAMIDE HCL 10 MG PO TABS: 5.0000 mg | ORAL_TABLET | Freq: Three times a day (TID) | ORAL | Status: DC | PRN

## 2021-02-09 MED ORDER — BISACODYL 10 MG RE SUPP
10.0000 mg | Freq: Every day | RECTAL | Status: DC | PRN
  Administered 2021-02-14: 10 mg via RECTAL
  Filled 2021-02-09: qty 1

## 2021-02-09 MED ORDER — MAGNESIUM HYDROXIDE 400 MG/5ML PO SUSP
30.0000 mL | Freq: Every day | ORAL | Status: DC | PRN
  Administered 2021-02-10 – 2021-02-11 (×2): 30 mL via ORAL
  Filled 2021-02-09 (×2): qty 30

## 2021-02-09 MED ORDER — PHENYLEPHRINE HCL (PRESSORS) 10 MG/ML IV SOLN
INTRAVENOUS | Status: DC | PRN
  Administered 2021-02-09: 100 ug via INTRAVENOUS

## 2021-02-09 MED ORDER — ONDANSETRON HCL 4 MG/2ML IJ SOLN: 4.0000 mg | Freq: Four times a day (QID) | INTRAMUSCULAR | Status: DC | PRN

## 2021-02-09 MED ORDER — EPHEDRINE SULFATE 50 MG/ML IJ SOLN
INTRAMUSCULAR | Status: DC | PRN
  Administered 2021-02-09 (×2): 5 mg via INTRAVENOUS

## 2021-02-09 MED ORDER — KETOROLAC TROMETHAMINE 30 MG/ML IJ SOLN
INTRAMUSCULAR | Status: DC | PRN
  Administered 2021-02-09: 15 mg via INTRAVENOUS

## 2021-02-09 MED ORDER — ONDANSETRON HCL 4 MG/2ML IJ SOLN
INTRAMUSCULAR | Status: AC
  Filled 2021-02-09: qty 2

## 2021-02-09 MED ORDER — SUCCINYLCHOLINE CHLORIDE 200 MG/10ML IV SOSY
PREFILLED_SYRINGE | INTRAVENOUS | Status: AC
  Filled 2021-02-09: qty 10

## 2021-02-09 MED ORDER — ACETAMINOPHEN 325 MG PO TABS: 325.0000 mg | ORAL_TABLET | Freq: Four times a day (QID) | ORAL | Status: DC | PRN

## 2021-02-09 MED ORDER — KETOROLAC TROMETHAMINE 15 MG/ML IJ SOLN
7.5000 mg | Freq: Four times a day (QID) | INTRAMUSCULAR | Status: AC
  Administered 2021-02-09 – 2021-02-10 (×4): 7.5 mg via INTRAVENOUS
  Filled 2021-02-09 (×4): qty 1

## 2021-02-09 MED ORDER — METHOCARBAMOL 500 MG PO TABS
750.0000 mg | ORAL_TABLET | Freq: Three times a day (TID) | ORAL | Status: DC | PRN
  Administered 2021-02-09 – 2021-02-14 (×3): 750 mg via ORAL
  Filled 2021-02-09 (×3): qty 2

## 2021-02-09 MED ORDER — ONDANSETRON HCL 4 MG PO TABS: 4.0000 mg | ORAL_TABLET | Freq: Four times a day (QID) | ORAL | Status: DC | PRN

## 2021-02-09 MED ORDER — SEVOFLURANE IN SOLN
RESPIRATORY_TRACT | Status: AC
  Filled 2021-02-09: qty 250

## 2021-02-09 MED ORDER — BUPIVACAINE HCL (PF) 0.5 % IJ SOLN
INTRAMUSCULAR | Status: DC | PRN
  Administered 2021-02-09: 10 mL

## 2021-02-09 MED ORDER — ROCURONIUM BROMIDE 100 MG/10ML IV SOLN
INTRAVENOUS | Status: DC | PRN
  Administered 2021-02-09: 50 mg via INTRAVENOUS
  Administered 2021-02-09: 30 mg via INTRAVENOUS

## 2021-02-09 MED ORDER — CEFAZOLIN SODIUM-DEXTROSE 2-4 GM/100ML-% IV SOLN
2.0000 g | Freq: Four times a day (QID) | INTRAVENOUS | Status: AC
  Administered 2021-02-09 – 2021-02-10 (×3): 2 g via INTRAVENOUS
  Filled 2021-02-09 (×3): qty 100

## 2021-02-09 MED ORDER — BUPIVACAINE-EPINEPHRINE (PF) 0.5% -1:200000 IJ SOLN
INTRAMUSCULAR | Status: DC | PRN
  Administered 2021-02-09: 30 mL via PERINEURAL

## 2021-02-09 MED ORDER — INFLUENZA VAC A&B SA ADJ QUAD 0.5 ML IM PRSY
0.5000 mL | PREFILLED_SYRINGE | INTRAMUSCULAR | Status: AC
  Administered 2021-02-10: 0.5 mL via INTRAMUSCULAR
  Filled 2021-02-09: qty 0.5

## 2021-02-09 MED ORDER — PROPOFOL 10 MG/ML IV BOLUS
INTRAVENOUS | Status: DC | PRN
  Administered 2021-02-09: 100 mg via INTRAVENOUS

## 2021-02-09 MED ORDER — FENTANYL CITRATE (PF) 100 MCG/2ML IJ SOLN: 25.0000 ug | INTRAMUSCULAR | Status: DC | PRN

## 2021-02-09 MED ORDER — ACETAMINOPHEN 325 MG PO TABS: 650.0000 mg | ORAL_TABLET | Freq: Four times a day (QID) | ORAL | Status: DC | PRN

## 2021-02-09 MED ORDER — METOCLOPRAMIDE HCL 5 MG/ML IJ SOLN: 5.0000 mg | Freq: Three times a day (TID) | INTRAMUSCULAR | Status: DC | PRN

## 2021-02-09 MED ORDER — PROPOFOL 500 MG/50ML IV EMUL
INTRAVENOUS | Status: AC
  Filled 2021-02-09: qty 50

## 2021-02-09 MED ORDER — HYDROCODONE-ACETAMINOPHEN 5-325 MG PO TABS
1.0000 | ORAL_TABLET | ORAL | Status: DC | PRN
  Administered 2021-02-10: 2 via ORAL
  Administered 2021-02-11 – 2021-02-12 (×3): 1 via ORAL
  Administered 2021-02-12 – 2021-02-14 (×5): 2 via ORAL
  Filled 2021-02-09 (×2): qty 1
  Filled 2021-02-09 (×7): qty 2
  Filled 2021-02-09: qty 1

## 2021-02-09 MED ORDER — SUGAMMADEX SODIUM 200 MG/2ML IV SOLN
INTRAVENOUS | Status: DC | PRN
  Administered 2021-02-09: 200 mg via INTRAVENOUS

## 2021-02-09 MED ORDER — ONDANSETRON HCL 4 MG/2ML IJ SOLN
INTRAMUSCULAR | Status: DC | PRN
  Administered 2021-02-09: 4 mg via INTRAVENOUS

## 2021-02-09 MED ORDER — KETOROLAC TROMETHAMINE 30 MG/ML IJ SOLN
INTRAMUSCULAR | Status: AC
  Filled 2021-02-09: qty 1

## 2021-02-09 MED ORDER — ENOXAPARIN SODIUM 40 MG/0.4ML IJ SOSY
40.0000 mg | PREFILLED_SYRINGE | INTRAMUSCULAR | Status: DC
  Administered 2021-02-10 – 2021-02-14 (×5): 40 mg via SUBCUTANEOUS
  Filled 2021-02-09 (×5): qty 0.4

## 2021-02-09 MED ORDER — FENTANYL CITRATE (PF) 100 MCG/2ML IJ SOLN
INTRAMUSCULAR | Status: DC | PRN
  Administered 2021-02-09 (×2): 50 ug via INTRAVENOUS
  Administered 2021-02-09 (×2): 25 ug via INTRAVENOUS

## 2021-02-09 MED ORDER — MORPHINE SULFATE (PF) 2 MG/ML IV SOLN
1.0000 mg | INTRAVENOUS | Status: DC | PRN
Start: 2021-02-09 — End: 2021-02-14

## 2021-02-09 SURGICAL SUPPLY — 69 items
BIT DRILL 2.2 SS TIBIAL (BIT) ×3 IMPLANT
BIT DRILL 4.3MMS DISTAL GRDTED (BIT) ×2 IMPLANT
BNDG COHESIVE 4X5 TAN ST LF (GAUZE/BANDAGES/DRESSINGS) ×6 IMPLANT
BNDG COHESIVE 6X5 TAN ST LF (GAUZE/BANDAGES/DRESSINGS) ×3 IMPLANT
BNDG ESMARK 4X12 TAN STRL LF (GAUZE/BANDAGES/DRESSINGS) ×3 IMPLANT
CHLORAPREP W/TINT 26 (MISCELLANEOUS) ×6 IMPLANT
DRAPE 3/4 80X56 (DRAPES) ×3 IMPLANT
DRAPE C-ARMOR (DRAPES) ×3 IMPLANT
DRAPE FLUOR MINI C-ARM 54X84 (DRAPES) ×3 IMPLANT
DRILL 4.3MMS DISTAL GRADUATED (BIT) ×3
DRSG MEPILEX SACRM 8.7X9.8 (GAUZE/BANDAGES/DRESSINGS) ×3 IMPLANT
DRSG OPSITE POSTOP 3X4 (GAUZE/BANDAGES/DRESSINGS) ×6 IMPLANT
DRSG OPSITE POSTOP 4X6 (GAUZE/BANDAGES/DRESSINGS) ×3 IMPLANT
ELECT CAUTERY BLADE 6.4 (BLADE) ×6 IMPLANT
ELECT REM PT RETURN 9FT ADLT (ELECTROSURGICAL) ×3
ELECTRODE REM PT RTRN 9FT ADLT (ELECTROSURGICAL) ×2 IMPLANT
GAUZE 4X4 16PLY ~~LOC~~+RFID DBL (SPONGE) ×6 IMPLANT
GAUZE SPONGE 4X4 12PLY STRL (GAUZE/BANDAGES/DRESSINGS) ×3 IMPLANT
GLOVE SRG 8 PF TXTR STRL LF DI (GLOVE) ×2 IMPLANT
GLOVE SURG ENC MOIS LTX SZ8 (GLOVE) ×6 IMPLANT
GLOVE SURG PR MICRO ENCORE 7.5 (GLOVE) ×9 IMPLANT
GLOVE SURG UNDER LTX SZ8 (GLOVE) ×3 IMPLANT
GLOVE SURG UNDER POLY LF SZ8 (GLOVE) ×1
GOWN STRL REUS W/ TWL LRG LVL3 (GOWN DISPOSABLE) ×2 IMPLANT
GOWN STRL REUS W/ TWL XL LVL3 (GOWN DISPOSABLE) ×2 IMPLANT
GOWN STRL REUS W/TWL LRG LVL3 (GOWN DISPOSABLE) ×1
GOWN STRL REUS W/TWL XL LVL3 (GOWN DISPOSABLE) ×1
GUIDEPIN VERSANAIL DSP 3.2X444 (ORTHOPEDIC DISPOSABLE SUPPLIES) ×3 IMPLANT
GUIDEWIRE BALL NOSE 100CM (WIRE) ×3 IMPLANT
K-WIRE 1.6 (WIRE) ×2
K-WIRE FX5X1.6XNS BN SS (WIRE) ×4
KWIRE FX5X1.6XNS BN SS (WIRE) ×4 IMPLANT
MANIFOLD NEPTUNE II (INSTRUMENTS) ×3 IMPLANT
MAT ABSORB  FLUID 56X50 GRAY (MISCELLANEOUS) ×1
MAT ABSORB FLUID 56X50 GRAY (MISCELLANEOUS) ×2 IMPLANT
NAIL HIP FRAC RT 130 11MX400M (Nail) ×3 IMPLANT
NEEDLE FILTER BLUNT 18X 1/2SAF (NEEDLE) ×1
NEEDLE FILTER BLUNT 18X1 1/2 (NEEDLE) ×2 IMPLANT
NEEDLE HYPO 22GX1.5 SAFETY (NEEDLE) ×3 IMPLANT
NS IRRIG 500ML POUR BTL (IV SOLUTION) ×3 IMPLANT
PACK HIP COMPR (MISCELLANEOUS) ×3 IMPLANT
PEG LOCKING SMOOTH 2.2X15 (Peg) ×3 IMPLANT
PEG LOCKING SMOOTH 2.2X16 (Screw) ×3 IMPLANT
PEG LOCKING SMOOTH 2.2X18 (Peg) ×9 IMPLANT
PEG LOCKING SMOOTH 2.2X20 (Screw) ×3 IMPLANT
PLATE DVR CROSSLOCK STD RT (Plate) ×3 IMPLANT
SCREW  LP NL 2.7X15MM (Screw) ×2 IMPLANT
SCREW  LP NL 2.7X16MM (Screw) ×1 IMPLANT
SCREW BONE CORTICAL 5.0X44 (Screw) ×3 IMPLANT
SCREW LAG 10.5MMX105MM HFN (Screw) ×3 IMPLANT
SCREW LP NL 2.7X15MM (Screw) ×4 IMPLANT
SCREW LP NL 2.7X16MM (Screw) ×2 IMPLANT
SCREW NONLOCK 2.7X18MM (Screw) ×3 IMPLANT
SPONGE T-LAP 18X18 ~~LOC~~+RFID (SPONGE) ×6 IMPLANT
STAPLER SKIN PROX 35W (STAPLE) ×6 IMPLANT
STOCKINETTE IMPERV 14X48 (MISCELLANEOUS) ×3 IMPLANT
STRAP SAFETY 5IN WIDE (MISCELLANEOUS) ×3 IMPLANT
SUT PROLENE 4 0 PS 2 18 (SUTURE) ×3 IMPLANT
SUT VIC AB 0 CT1 36 (SUTURE) ×3 IMPLANT
SUT VIC AB 1 CT1 36 (SUTURE) ×3 IMPLANT
SUT VIC AB 2-0 CT1 (SUTURE) ×6 IMPLANT
SUT VIC AB 2-0 SH 27 (SUTURE) ×1
SUT VIC AB 2-0 SH 27XBRD (SUTURE) ×2 IMPLANT
SUT VIC AB 3-0 SH 27 (SUTURE) ×1
SUT VIC AB 3-0 SH 27X BRD (SUTURE) ×2 IMPLANT
SYR 10ML LL (SYRINGE) ×6 IMPLANT
SYR 30ML LL (SYRINGE) ×3 IMPLANT
TAPE MICROFOAM 4IN (TAPE) ×3 IMPLANT
WATER STERILE IRR 500ML POUR (IV SOLUTION) ×3 IMPLANT

## 2021-02-09 NOTE — Assessment & Plan Note (Addendum)
--   Recommended cessation

## 2021-02-09 NOTE — Anesthesia Procedure Notes (Signed)
Procedure Name: Intubation Date/Time: 02/09/2021 4:05 PM Performed by: Hedda Slade, CRNA Pre-anesthesia Checklist: Patient identified, Patient being monitored, Timeout performed, Emergency Drugs available and Suction available Patient Re-evaluated:Patient Re-evaluated prior to induction Oxygen Delivery Method: Circle system utilized Preoxygenation: Pre-oxygenation with 100% oxygen Induction Type: IV induction Ventilation: Mask ventilation without difficulty Laryngoscope Size: 4 and McGraph Grade View: Grade I Tube type: Oral Tube size: 7.5 mm Number of attempts: 1 Airway Equipment and Method: Stylet Placement Confirmation: ETT inserted through vocal cords under direct vision, positive ETCO2 and breath sounds checked- equal and bilateral Secured at: 22 cm Tube secured with: Tape Dental Injury: Teeth and Oropharynx as per pre-operative assessment

## 2021-02-09 NOTE — Progress Notes (Signed)
Attempted to take patient off simple mask, oxygen dropped to 86% on room air. Tried placing patient on 4 L [place], increased SpO2 to 88%. Patient is a mouth breather, snoring. Placed back on simple mask at 6L. SpO2 back to 97%.

## 2021-02-09 NOTE — Progress Notes (Signed)
  Progress Note  Jordan Barber   KZL:[id number]  DOB: 1945/07/04  DOA: 02/08/2021     1 Date of Service: 02/09/2021   Clinical Course 75 year old man in relatively good health, presented after mechanical fall resulting in right wrist and hip pain. --10/15 admitted for right hip and wrist fracture --10/16 overall stable, plan for surgery today  Assessment and Plan * Hip fracture, right, closed, initial encounter ([place]) -- Pain fairly well controlled.  Continue hip fracture protocol including analgesia.  Plan for operative intervention today for orthopedics. -- RCRI 0, class 1 risk, 3.9% 30-day risk of death, MI or cardiac arrest. -- No history of cardiac disease or reported symptoms.  Recommend proceeding with surgery.  Other fractures of lower end of right radius, initial encounter for closed fracture -- Operative intervention per orthopedics, continue pain control  Hyponatremia -- Mild, asymptomatic, appears to be chronic, follow-up as an outpatient  Emphysema ([place]) -- Mild, does not use any inhalers but activities are limited by breathing, recommend close outpatient follow-up, consider outpatient pulmonology evaluation  Aortic atherosclerosis ([place]) -- Resume Lipitor on discharge  Cigarette smoker -- Recommended cessation today Subjective:  For feels okay, pain primarily in her right wrist from swelling pressing against the splint  Objective Vitals:   02/09/21 0034 02/09/21 0426 02/09/21 0700 02/09/21 1101  BP: (!) 95/59 138/65 129/69 130/65  Pulse: 66 86 72 77  Resp: 19 19 18 16   Temp: 97.6 F (36.4 C) 97.7 F (36.5 C) (!) 97.4 F (36.3 C) 98.4 F (36.9 C)  TempSrc: Oral Oral  Oral  SpO2: 91% 95% 93% 96%  Weight: 70.8 kg     Height: 6' (1.829 m)      70.8 kg  Vital signs were reviewed and unremarkable.  Exam Physical Exam Constitutional:      General: He is not in acute distress.    Appearance: He is not ill-appearing or toxic-appearing.  Cardiovascular:      Rate and Rhythm: Normal rate and regular rhythm.     Heart sounds: No murmur heard. Pulmonary:     Effort: Pulmonary effort is normal. No respiratory distress.     Breath sounds: No wheezing, rhonchi or rales.  Musculoskeletal:     Right lower leg: No edema.     Left lower leg: No edema.     Comments: Right wrist in splint  Neurological:     Mental Status: He is alert.  Psychiatric:        Mood and Affect: Mood normal.        Behavior: Behavior normal.    Labs / Other Information My review of labs, imaging, notes and other tests is significant for    sodium 128, also low in 2017, no inpatient evaluation warranted; hemoglobin stable  Disposition Plan: Status is: Inpatient  Remains inpatient appropriate because: Hip and wrist fractures, requiring operative intervention today  Discussed with wife at bedside Full code Enoxaparin post surgery  Time spent: 25 minutes [facility] 02/09/2021, 12:13 PM

## 2021-02-09 NOTE — Assessment & Plan Note (Signed)
--   Operative intervention per orthopedics, continue pain control

## 2021-02-09 NOTE — Progress Notes (Signed)
Decreased from 6L simple mask to 3L simple mask. Patient at 95%. Still snoring.

## 2021-02-09 NOTE — Transfer of Care (Signed)
Immediate Anesthesia Transfer of Care Note  Patient: Amr [place]tevant Carlton  Procedure(s) Performed: INTRAMEDULLARY (IM) NAIL INTERTROCHANTRIC (Right: Hip) OPEN REDUCTION INTERNAL FIXATION (ORIF) DISTAL RADIAL FRACTURE (Right: Arm Lower)  Patient Location: PACU  Anesthesia Type:General  Level of Consciousness: awake and alert   Airway & Oxygen Therapy: Patient Spontanous Breathing and Patient connected to face mask oxygen  Post-op Assessment: Report given to RN and Post -op Vital signs reviewed and stable  Post vital signs: Reviewed and stable  Last Vitals:  Vitals Value Taken Time  BP 137/72 02/09/21 1903  Temp 36.2 C 02/09/21 1903  Pulse 80 02/09/21 1908  Resp 24 02/09/21 1908  SpO2 96 % 02/09/21 1908  Vitals shown include unvalidated device data.  Last Pain:  Vitals:   02/09/21 1903  TempSrc:   PainSc: Asleep         Complications: No notable events documented.

## 2021-02-09 NOTE — Op Note (Signed)
02/09/2021  7:06 PM  Patient:   Jordan Barber  Pre-Op Diagnosis:   1.  Closed displaced intertrochanteric fracture, right hip. 2.  Closed displaced intra-articular fracture, right distal radius.  Post-Op Diagnosis:   Same  Procedure:   1.  Reduction and internal fixation of displaced intertrochanteric right hip fracture with Biomet Affixis TFN nail.  2.  Open reduction and internal fixation of displaced intra-articular right distal radius fracture.  Surgeon:   Pascal Lux, MD  Assistant:   None  Anesthesia:   GET  Findings:   As above  Complications:   None  EBL:   50 cc  Fluids:   1100 cc crystalloid  UOP:   None  TT:   None  Drains:   None  Closure:   Staples  Implants:   1.  Biomet Affixis 11 x 400 mm TFN with a 105 mm lag screw and a 44 mm distal interlocking screw. 2.  Small standard CrossLock distal radius plate and screws.  Brief Clinical Note:   The patient is a 75 year old male who sustained the above-noted injuries yesterday morning when he was pulled to the ground by his dog while walking the dog, landing on his right side. He was brought to the emergency room where x-rays demonstrated the above-noted injuries. The patient has been cleared medically and presents at this time for reduction and internal fixation of the displaced intertrochanteric right hip fracture, as well as open reduction internal fixation of the right distal radius fracture.  Procedure:   The patient was brought into the operating room and lain in the supine position. After adequate general endotracheal intubation and anesthesia was obtained, the patient was lain in the supine position on the fracture table. The uninjured leg was placed in a flexed and abducted position while the injured lower extremity was placed in longitudinal traction. The fracture was reduced using longitudinal traction and internal rotation. The adequacy of reduction was verified fluoroscopically in AP and lateral  projections and found to be near anatomic. The lateral aspects of the right hip and thigh were prepped with ChloraPrep solution before being draped sterilely. Preoperative antibiotics were administered. A timeout was performed to verify the appropriate surgical site.   The greater trochanter was identified fluoroscopically and an approximately 3 cm incision made about 2-3 fingerbreadths above the tip of the greater trochanter. The incision was carried down through the subcutaneous tissues to expose the gluteal fascia. This was split the length of the incision, providing access to the tip of the trochanter. Under fluoroscopic guidance, a guidewire was drilled through the tip of the trochanter into the proximal metaphysis to the level of the lesser trochanter. After verifying its position fluoroscopically in AP and lateral projections, it was overreamed with the initial reamer to the depth of the lesser trochanter. A guidewire was passed down through the femoral canal to the supracondylar region. The adequacy of guidewire position was verified fluoroscopically in AP and lateral projections before the length of the guidewire within the canal was measured and found to be 415 mm. Therefore, a 400 mm length nail was selected. The guidewire was overreamed sequentially using the flexible reamers, beginning with a 10.5 mm reamer and progressing to a 12.5 mm reamer. This provided good cortical chatter. The 11 x 400 mm Biomet Affixis TFN rod was selected and advanced to the appropriate depth, as verified fluoroscopically.   The guide system for the lag screw was positioned and advanced through an approximately 2 cm  stab incision over the lateral aspect of the proximal femur. The guidewire was drilled up through the trochanteric femoral nail and into the femoral neck to rest within 5 mm of subchondral bone. After verifying its position in the femoral neck and head in both AP and lateral projections, the guidewire was  measured and found to be optimally replicated by a 301 mm lag screw. The guidewire was overreamed to the appropriate depth before the lag screw was inserted and advanced to the appropriate depth as verified fluoroscopically in AP and lateral projections. The locking screw was advanced, then backed off a quarter turn to set the lag screw. Again the adequacy of hardware position and fracture reduction was verified fluoroscopically in AP and lateral projections and found to be excellent.  Attention was directed distally. Using the "perfect circle" technique, the leg and fluoroscopy machine were positioned appropriately. An approximately 1.5 cm stab incision was made over the skin at the appropriate point before the drill bit was advanced through the cortex and across the static hole of the nail. The appropriate length of the screw was determined before the 44 mm distal interlocking screw was positioned, then advanced and tightened securely. Again the adequacy of screw position was verified fluoroscopically in AP and lateral projections and found to be excellent.  The wounds were irrigated thoroughly with sterile saline solution before the abductor fascia was reapproximated using #1 Vicryl interrupted sutures. The subcutaneous tissues were closed using 2-0 Vicryl interrupted sutures. The skin was closed using staples. A total of 30 cc of 0.5% Sensorcaine with epinephrine was injected in and around all incisions. Sterile occlusive dressings were applied to all wounds.  Next, the distal radius fracture was addressed after repositioning the patient.  The patient's right hand and upper extremity were prepped with ChloraPrep solution before being draped sterilely. Preoperative antibiotics were administered. A timeout was performed to verify the appropriate surgical site before the limb was exsanguinated with an Esmarch and the tourniquet inflated to 250 mmHg.   An approximately 7-8 cm incision was made over the volar  aspect of the distal radius beginning at the volar flexion crease and extending proximally along the flexor carpi radialis tendon. The incision was carried down through the subcutaneous tissues to expose the superficial retinaculum. This was split the length of the incision directly over the flexor carpi radialis tendon. The FCR sheath was opened and the tendon retracted ulnarly to protect the median nerve. The floor of the FCR sheath was opened to expose the pronator quadratus muscle. This was released along the radial insertion and the muscle was retracted ulnarly to expose the distal radius. The fracture was identified and soft tissues elevated off the distal metaphyseal region for several centimeters.   The appropriate sized plate was selected and positioned on the distal radius. A guidewire was placed through the distal hole and its position verified using FluoroScan imaging in AP and lateral projections. After several attempts, the pin was positioned parallel to the distal articular surface and approximately 3-4 mm proximal to the articular surface. The plate was carefully lowered onto the volar metaphyseal surface, reducing the fracture in the process. Again the position of the plate was verified using FluoroScan imaging in AP and lateral projections and found to be excellent. The plate was secured using a nonlocking bicortical screw proximally. Distally, the holes were filled with locking pegs of the appropriate lengths. The adequacy of hardware position and fracture reduction was verified using FluoroScan imaging in AP, lateral, and  several additional oblique projections to be sure that the hardware did not enter the joint nor did it penetrate dorsally. Two additional bicortical nonlocking screws were placed proximally to secure the plate to the metaphyseal region proximally. Again the construct was assessed using FluoroScan imaging in AP, lateral, and oblique projections and found to be excellent.  The  wound was copiously irrigated with sterile saline solution before the pronator quadratus was reapproximated using 2-0 Vicryl interrupted sutures. The subcutaneous tissues were closed using 2-0 Vicryl interrupted sutures before the subcuticular layer was closed using 3-0 Vicryl inverted interrupted sutures. Benzoin and Steri-Strips are applied to the skin. A total of 10 cc of 0.5% plain Sensorcaine was injected in and around the incision site to help with postoperative analgesia before a sterile bulky dressing was applied to the wound. The patient was placed into a volar splint maintaining the wrist in neutral position before the patient was awakened, extubated, and returned to the recovery room in satisfactory condition after tolerating the procedure well.

## 2021-02-09 NOTE — Assessment & Plan Note (Signed)
--   Resume Lipitor on discharge

## 2021-02-09 NOTE — Assessment & Plan Note (Signed)
--   Mild, does not use any inhalers but activities are limited by breathing, recommend close outpatient follow-up, consider outpatient pulmonology evaluation

## 2021-02-09 NOTE — Assessment & Plan Note (Addendum)
--   Status post surgery 52/71 -- Complicated by acute blood loss anemia -- Continue hip fracture standard management, plan for SNF

## 2021-02-09 NOTE — Assessment & Plan Note (Signed)
--   Mild, asymptomatic, appears to be chronic, follow-up as an outpatient

## 2021-02-09 NOTE — Hospital Course (Addendum)
75 year old man in relatively good health, presented after mechanical fall resulting in right wrist and hip pain. --10/15 admitted for right hip and wrist fracture --10/16 overall stable, plan for surgery today --10/17 new hypoxia, probably secondary to underlying emphysema, otherwise seems to be slowly improving although not ready to go home --10/18 plan for SNF now, condition stabilizing.

## 2021-02-09 NOTE — Anesthesia Preprocedure Evaluation (Signed)
Anesthesia Evaluation  Patient identified by MRN, date of birth, ID band Patient awake    Reviewed: Allergy & Precautions, NPO status , Patient's Chart, lab work & pertinent test results  History of Anesthesia Complications Negative for: history of anesthetic complications  Airway Mallampati: II       Dental  (+) Dental Advidsory Given, Caps, Implants, Teeth Intact Permanent bridges:   Pulmonary shortness of breath and with exertion, neg sleep apnea, neg COPD, neg recent URI, Current Smoker,           Cardiovascular Exercise Tolerance: Good (-) hypertension(-) angina(-) Past MI and (-) CHF (-) dysrhythmias (-) Valvular Problems/Murmurs     Neuro/Psych neg Seizures    GI/Hepatic Neg liver ROS, neg GERD  ,  Endo/Other  neg diabetes  Renal/GU negative Renal ROS     Musculoskeletal   Abdominal   Peds  Hematology   Anesthesia Other Findings Past Medical History: No date: Fall 02/08/2021: Hip fracture, right, closed, initial encounter ([place]) No date: Prostate cancer ([place]) No date: Tobacco abuse   Reproductive/Obstetrics                             Anesthesia Physical  Anesthesia Plan  ASA: 2  Anesthesia Plan: General   Post-op Pain Management:    Induction: Intravenous  PONV Risk Score and Plan: Ondansetron, Dexamethasone and Treatment may vary due to age or medical condition  Airway Management Planned: Oral ETT  Additional Equipment:   Intra-op Plan:   Post-operative Plan: Extubation in OR  Informed Consent: I have reviewed the patients History and Physical, chart, labs and discussed the procedure including the risks, benefits and alternatives for the proposed anesthesia with the patient or authorized representative who has indicated his/her understanding and acceptance.       Plan Discussed with:   Anesthesia Plan Comments:         Anesthesia Quick Evaluation

## 2021-02-09 NOTE — Consult Note (Signed)
ORTHOPAEDIC CONSULTATION  REQUESTING PHYSICIAN: Samuella Cota, MD  Chief Complaint:   Right hip and right wrist pain.  History of Present Illness: Jordan Barber is a 75 y.o. male with a history of prostate cancer and tobacco abuse who is in otherwise good health and lives independently with his wife.  The patient was in his usual state of health yesterday evening when he apparently was pulled down by his dog while walking the dog, landing on his right side and injuring his right wrist and right hip.  He was brought to the emergency room where x-rays demonstrated a mildly displaced intertrochanteric fracture of the right hip, as well as a displaced intra-articular fracture of the right distal radius.  The patient denies any associated injuries.  He did not strike his head or lose consciousness.  The patient also denies any lightheadedness, dizziness, chest pain, shortness of breath, or other symptoms which may have precipitated his fall.  The patient has been admitted to the hospitalist service in preparation for definitive management of these injuries.  Past Medical History:  Diagnosis Date   Fall    Prostate cancer [facility])    Tobacco abuse    Past Surgical History:  Procedure Laterality Date   COLONOSCOPY  10/18/2006   COLONOSCOPY WITH PROPOFOL N/A 02/03/2017   Procedure: COLONOSCOPY WITH PROPOFOL;  Surgeon: Robert Bellow, MD;  Location: [facility] ENDOSCOPY;  Service: Endoscopy;  Laterality: N/A;   ORIF TIBIA PLATEAU Left 11/29/2015   Procedure: OPEN REDUCTION INTERNAL FIXATION (ORIF) TIBIAL PLATEAU;  Surgeon: Corky Mull, MD;  Location: [facility] ORS;  Service: Orthopedics;  Laterality: Left;   PROSTATE SURGERY  2011   Social History   Socioeconomic History   Marital status: Married    Spouse name: Not on file   Number of children: Not on file   Years of education: Not on file   Highest education level: Not on file   Occupational History   Not on file  Tobacco Use   Smoking status: Every Day    Packs/day: 1.00    Years: 50.00    Pack years: 50.00    Types: Cigarettes   Smokeless tobacco: Never  Vaping Use   Vaping Use: Never used  Substance and Sexual Activity   Alcohol use: Yes    Comment: occasional beers   Drug use: No   Sexual activity: Not on file  Other Topics Concern   Not on file  Social History Narrative   Not on file   Social Determinants of Health   Financial Resource Strain: Not on file  Food Insecurity: Not on file  Transportation Needs: Not on file  Physical Activity: Not on file  Stress: Not on file  Social Connections: Not on file   Family History  Problem Relation Age of Onset   Dementia Mother    Heart disease Father    No Known Allergies Prior to Admission medications   Medication Sig Start Date End Date Taking? Authorizing Provider  atorvastatin (LIPITOR) 80 MG tablet Take 1 tablet (80 mg total) by mouth at bedtime. 12/02/17 12/02/18  Darel Hong, MD  clobetasol ointment (TEMOVATE) 8.24 % Apply 1 application topically 2 (two) times daily.    [provider]   DG Wrist Complete Right  Result Date: 02/08/2021 CLINICAL DATA:  Fall EXAM: RIGHT WRIST - COMPLETE 3+ VIEW COMPARISON:  None. FINDINGS: Mildly anteriorly displaced fracture of the distal right radius, extra-articular. Moderate soft tissue swelling. No ulna fracture. IMPRESSION: Mildly anteriorly  displaced extra-articular fracture of the distal right radius. Electronically Signed   By: Ulyses Jarred M.D.   On: 02/08/2021 21:51   DG Chest Portable 1 View  Result Date: 02/08/2021 CLINICAL DATA:  Fall EXAM: PORTABLE CHEST 1 VIEW COMPARISON:  06/09/2011 FINDINGS: The heart size and mediastinal contours are within normal limits. Both lungs are clear. The visualized skeletal structures are unremarkable. IMPRESSION: No active disease. Electronically Signed   By: Ulyses Jarred M.D.   On: 02/08/2021 22:07    DG Hip Unilat W or Wo Pelvis 2-3 Views Right  Result Date: 02/08/2021 CLINICAL DATA:  Fall EXAM: DG HIP (WITH OR WITHOUT PELVIS) 2-3V RIGHT COMPARISON:  None. FINDINGS: Minimally displaced intertrochanteric fracture of the proximal right femur. No other pelvic fracture. IMPRESSION: Minimally displaced intertrochanteric fracture of the proximal right femur. Electronically Signed   By: Ulyses Jarred M.D.   On: 02/08/2021 21:52    Positive ROS: All other systems have been reviewed and were otherwise negative with the exception of those mentioned in the HPI and as above.  Physical Exam: General:  Alert, no acute distress Psychiatric:  Patient is competent for consent with normal mood and affect   Cardiovascular:  No pedal edema Respiratory:  No wheezing, non-labored breathing GI:  Abdomen is soft and non-tender Skin:  No lesions in the area of chief complaint Neurologic:  Sensation intact distally Lymphatic:  No axillary or cervical lymphadenopathy  Orthopedic Exam:  Orthopedic examination is limited to the right forearm and hand.  The patient is in a volar splint maintaining the wrist in neutral alignment.  There is moderate swelling over the dorsal aspect of the wrist, as well as moderate tenderness to palpation in this area.  Skin inspection of the proximal distal margins of the splint show that the skin is in good condition and without evidence for erythema, ecchymosis, abrasions, or other skin abnormalities.  He is able active flex and extend all digits, although motion is somewhat limited due to pain and apprehension.  He is neurovascularly intact to all digits.  Orthopedic examination is limited to the right hip and lower extremity.  The right lower extremity is somewhat shortened and externally rotated as compared to the left.  Skin inspection around the right hip is notable for some swelling, but otherwise is unremarkable.  No erythema, ecchymosis, abrasions, or other skin abnormalities  are identified.  He has mild tenderness to palpation over the lateral aspect of the right hip.  He has more severe pain with any attempted active or passive motion of the right hip or lower extremity.  He is neurovascular intact to the right lower extremity, demonstrating the ability to actively dorsiflex and plantarflex his toes and ankle.  Sensations intact to light touch to all distributions.  Has good capillary refill to his right foot.  X-rays:  AP, lateral, and oblique views of the right wrist are available for review and have been reviewed by myself.  These films demonstrate an impacted intra-articular fracture of the right distal radius with dorsal tilt and loss of radial inclination.  No lytic lesions or significant degenerative changes are noted in the wrist, although his he does exhibit moderate to severe degenerative changes of the right thumb CMC joint.  X-rays of the pelvis and right hip also are available for review and have been reviewed by myself.  These films demonstrate a mildly displaced intertrochanteric fracture of the right hip.  No significant degenerative changes are identified.  No lytic lesions or  other acute bony abnormalities are noted.  Assessment: 1.  Closed displaced intra-articular right distal radius fracture. 2.  Closed displaced intertrochanteric fracture, right hip.  Plan: The treatment options for each of these problems have been discussed with the patient and his wife, who is at the bedside, including both surgical and nonsurgical choices.  Both the patient and his wife would like to proceed with surgical intervention to include an open reduction and internal fixation of the right distal radius fracture as well as an intramedullary nailing of the right intertrochanteric hip fracture.  The risks (including bleeding, infection, nerve and/or blood vessel injury, persistent or recurrent pain, loosening or failure of the components, malunion and/or nonunion, development  of degenerative joint disease, stiffness of the wrist and/or hip joints, need for further surgery, blood clots, strokes, heart attacks or arrhythmias, pneumonia, etc.) and benefits of the surgical procedure were discussed.  The patient states his understanding and agrees to proceed.  He agrees to a blood transfusion if necessary.  A formal written consent will be obtained by the nursing staff.    Pascal Lux, MD  Beeper #:  [id number]  02/09/2021 9:43 AM

## 2021-02-10 ENCOUNTER — Encounter: Payer: Self-pay | Admitting: Surgery

## 2021-02-10 DIAGNOSIS — J9601 Acute respiratory failure with hypoxia: Secondary | ICD-10-CM

## 2021-02-10 DIAGNOSIS — S72001A Fracture of unspecified part of neck of right femur, initial encounter for closed fracture: Secondary | ICD-10-CM

## 2021-02-10 DIAGNOSIS — E871 Hypo-osmolality and hyponatremia: Secondary | ICD-10-CM | POA: Diagnosis not present

## 2021-02-10 HISTORY — DX: Acute respiratory failure with hypoxia: J96.01

## 2021-02-10 LAB — CBC
HCT: 34.3 % — ABNORMAL LOW (ref 39.0–52.0)
Hemoglobin: 11.9 g/dL — ABNORMAL LOW (ref 13.0–17.0)
MCH: 34.5 pg — ABNORMAL HIGH (ref 26.0–34.0)
MCHC: 34.7 g/dL (ref 30.0–36.0)
MCV: 99.4 fL (ref 80.0–100.0)
Platelets: 124 10*3/uL — ABNORMAL LOW (ref 150–400)
RBC: 3.45 MIL/uL — ABNORMAL LOW (ref 4.22–5.81)
RDW: 13.3 % (ref 11.5–15.5)
WBC: 9.5 10*3/uL (ref 4.0–10.5)
nRBC: 0 % (ref 0.0–0.2)

## 2021-02-10 LAB — BASIC METABOLIC PANEL
Anion gap: 7 (ref 5–15)
BUN: 12 mg/dL (ref 8–23)
CO2: 25 mmol/L (ref 22–32)
Calcium: 8.1 mg/dL — ABNORMAL LOW (ref 8.9–10.3)
Chloride: 99 mmol/L (ref 98–111)
Creatinine, Ser: 0.68 mg/dL (ref 0.61–1.24)
GFR, Estimated: >60 mL/min (ref >60)
Glucose, Bld: 97 mg/dL (ref 70–99)
Potassium: 4.1 mmol/L (ref 3.5–5.1)
Sodium: 131 mmol/L — ABNORMAL LOW (ref 135–145)

## 2021-02-10 MED ORDER — PROPOFOL 500 MG/50ML IV EMUL
INTRAVENOUS | Status: AC
  Filled 2021-02-10: qty 50

## 2021-02-10 MED ORDER — LIDOCAINE HCL (PF) 2 % IJ SOLN
INTRAMUSCULAR | Status: AC
  Filled 2021-02-10: qty 10

## 2021-02-10 MED ORDER — ALBUTEROL SULFATE (2.5 MG/3ML) 0.083% IN NEBU: 2.5000 mg | INHALATION_SOLUTION | RESPIRATORY_TRACT | Status: DC | PRN

## 2021-02-10 MED ORDER — NICOTINE 14 MG/24HR TD PT24
14.0000 mg | MEDICATED_PATCH | Freq: Every day | TRANSDERMAL | Status: DC
  Filled 2021-02-10: qty 1

## 2021-02-10 MED ORDER — PROPOFOL 500 MG/50ML IV EMUL
INTRAVENOUS | Status: AC
  Filled 2021-02-10: qty 100

## 2021-02-10 MED ORDER — ALBUTEROL SULFATE (2.5 MG/3ML) 0.083% IN NEBU
INHALATION_SOLUTION | RESPIRATORY_TRACT | Status: AC
  Administered 2021-02-10: 2.5 mg
  Filled 2021-02-10: qty 3

## 2021-02-10 MED ORDER — LIDOCAINE HCL (PF) 2 % IJ SOLN
INTRAMUSCULAR | Status: AC
  Filled 2021-02-10: qty 5

## 2021-02-10 NOTE — Progress Notes (Signed)
  Subjective: 1 Day Post-Op Procedure(s) (LRB): INTRAMEDULLARY (IM) NAIL INTERTROCHANTRIC (Right) OPEN REDUCTION INTERNAL FIXATION (ORIF) DISTAL RADIAL FRACTURE (Right) Patient reports pain as mild this morning.  Much improved compared to yesterday.  Patient is well, and has had no acute complaints or problems PT and care management to assist with discharge planning. Negative for chest pain and shortness of breath Fever: no Gastrointestinal:Negative for nausea and vomiting Patient reports he is passing gas.  Objective: Vital signs in last 24 hours: Temp:  [97.1 F (36.2 C)-98.5 F (36.9 C)] 97.9 F (36.6 C) (10/17 0420) Pulse Rate:  [67-86] 76 (10/17 0420) Resp:  [16-23] 20 (10/17 0420) BP: (119-139)/(65-85) 119/68 (10/17 0420) SpO2:  [88 %-98 %] 92 % (10/17 0420)  Intake/Output from previous day:  Intake/Output Summary (Last 24 hours) at 02/10/2021 0744 Last data filed at 02/10/2021 0404 Gross per 24 hour  Intake 1187.47 ml  Output 450 ml  Net 737.47 ml    Intake/Output this shift: No intake/output data recorded.  Labs: Recent Labs    02/08/21 2103 02/09/21 0413 02/10/21 0405  HGB 15.0 14.3 11.9*   Recent Labs    02/09/21 0413 02/10/21 0405  WBC 12.6* 9.5  RBC 4.32 3.45*  HCT 42.8 34.3*  PLT 179 124*   Recent Labs    02/09/21 0413 02/10/21 0405  NA 128* 131*  K 3.9 4.1  CL 98 99  CO2 24 25  BUN 12 12  CREATININE 0.63 0.68  GLUCOSE 107* 97  CALCIUM 8.8* 8.1*   No results for input(s): LABPT, INR in the last 72 hours.   EXAM General - Patient is Alert, Appropriate, and Oriented Extremity - ABD soft Neurovascular intact Dorsiflexion/Plantar flexion intact Incision: dressing C/D/I No cellulitis present Compartment soft Short arm splint applied to the right arm.  Swelling noted in fingers. Able to flex and extend fingers with mild pain. Intact to light touch to the right hand. Dressing/Incision - No drainage noted to either the right arm  splint or right leg. Motor Function - intact, moving foot and toes well on exam.  Abdomen soft with normal bowel sounds.  Past Medical History:  Diagnosis Date   Fall    Hip fracture, right, closed, initial encounter ([place]) 02/08/2021   Prostate cancer ([place])    Tobacco abuse     Assessment/Plan: 1 Day Post-Op Procedure(s) (LRB): INTRAMEDULLARY (IM) NAIL INTERTROCHANTRIC (Right) OPEN REDUCTION INTERNAL FIXATION (ORIF) DISTAL RADIAL FRACTURE (Right) Principal Problem:   Hip fracture, right, closed, initial encounter [facility]) Active Problems:   Other fractures of lower end of right radius, initial encounter for closed fracture   Cigarette smoker   Hyponatremia   Aortic atherosclerosis (HCC)  Estimated body mass index is 21.17 kg/m as calculated from the following:   Height as of this encounter: 6' (1.829 m).   Weight as of this encounter: 70.8 kg. Advance diet Up with therapy D/C IV fluids when tolerating po intake.  Labs reviewed this AM, Hg 11.9 this AM. Up with therapy today. Begin working on BM. Will need to remain non-weightbearing to the right arm.  Walker with arm attachment will be needed.  DVT Prophylaxis - Lovenox, Foot Pumps, and TED hose Weight-Bearing as tolerated to right leg Non-weightbearing to the right arm.  Raquel Melquisedec Journey, PA-C [facility] 02/10/2021, 7:44 AM

## 2021-02-10 NOTE — Progress Notes (Signed)
  Progress Note Jordan Barber   [id number]:[id number]  DOB: 10-26-1945  DOA: 02/08/2021     2 Date of Service: 02/10/2021   Clinical Course 75 year old man in relatively good health, presented after mechanical fall resulting in right wrist and hip pain. --10/15 admitted for right hip and wrist fracture --10/16 overall stable, plan for surgery today --10/17 new hypoxia, probably secondary to underlying emphysema, otherwise seems to be slowly improving although not ready to go home  Assessment and Plan * Hip fracture, right, closed, initial encounter [facility]) -- Status post surgery 10/16.  Feeling okay today, did fairly well with therapy although did require a lot of effort. --Continue management per orthopedics  Acute respiratory failure with hypoxia (HCC) -- New diagnosis, secondary to underlying emphysema, ongoing smoking, unmasked in current hospitalization secondary to acute injuries and decreased mobility. -- Suspect will need oxygen on discharge.  Other fractures of lower end of right radius, initial encounter for closed fracture -- Operative intervention per orthopedics, continue pain control  Hyponatremia -- Mild, asymptomatic, appears to be chronic, follow-up as an outpatient  Emphysema ([place]) -- Mild, does not use any inhalers but activities are limited by breathing, recommend close outpatient follow-up, consider outpatient pulmonology evaluation  Aortic atherosclerosis ([place]) -- Resume Lipitor on discharge  Cigarette smoker -- Recommended cessation    Subjective:  Overall feels okay, eating okay, to get short of breath with therapy.  Objective Vitals:   02/09/21 2246 02/10/21 0420 02/10/21 0818 02/10/21 1115  BP: 122/72 119/68 103/90 97/62  Pulse: 83 76 81 76  Resp: 20 20 15 14   Temp: 98.5 F (36.9 C) 97.9 F (36.6 C) 98.1 F (36.7 C) 97.9 F (36.6 C)  TempSrc:      SpO2: 95% 92% 95% 97%  Weight:      Height:       70.8 kg  Vital signs were reviewed and  unremarkable.  Hypoxia on room air, started on oxygen.  Exam Physical Exam Constitutional:      General: He is not in acute distress.    Appearance: He is not ill-appearing or toxic-appearing.  Cardiovascular:     Rate and Rhythm: Normal rate and regular rhythm.     Heart sounds: No murmur heard. Pulmonary:     Effort: No respiratory distress.     Breath sounds: No wheezing, rhonchi or rales.     Comments: Mild increased respiratory effort Neurological:     Mental Status: He is alert.  Psychiatric:        Mood and Affect: Mood normal.        Behavior: Behavior normal.    Labs / Other Information My review of labs, imaging, notes and other tests is significant for    sodium 131, remainder BMP unremarkable.  Hemoglobin down to 11.9.  Follow.  Leukocytosis resolved.  Disposition Plan: Status is: Inpatient  Remains inpatient appropriate because: First day postop wrist fracture and hip fracture repairs.  Working with therapy, new oxygen requirement.  Discussed with wife at bedside Full enoxaparin  Time spent: 20 minutes [facility] 02/10/2021, 2:53 PM

## 2021-02-10 NOTE — Progress Notes (Signed)
Physical Therapy Treatment Patient Details Name: Jordan Barber MRN: [id number] DOB: July 16, 1945 Today's Date: 02/10/2021   History of Present Illness Pt is a 75 y/o M admitted on 02/08/21. Pt fell while walking his dog & had pain in R wrist & hip. Pt underwent R intertrochanteric IM nail & R wrist ORIF of distal radial fx. Pt can weight bear through RUE elbow to use platform walker but is otherwise NWB through RUE forearm, wrist, & hand. RLE WBAT. PMH: prostate CA, tobacco abuse    PT Comments    Pt seen for PT tx with pt agreeable. Utilized +2 for chair follow for safety with gait with pt able to ambulate ~45 ft with R PFRW & min assist. Pt limited by O2 readings & nurse aware. Pt requires more assistance to power up to standing from low recliner compared to elevated EOB from earlier session. Continue to recommend HHPT f/u upon d/c. Will follow acutely to progress gait with LRAD & for stair negotiation as pt has 2 STE without rails.  Pt on 3L/min via nasal cannula upon PT arrival, SpO2 89-91%. Had lowest reading of 77% during gait but question accuracy of pulse oximeter. Other reading obtained was 85% & pt increased to 4L/min & seated rest break provided. At end of session pt on 3L/min & 98%.    Recommendations for follow up therapy are one component of a multi-disciplinary discharge planning process, led by the attending physician.  Recommendations may be updated based on patient status, additional functional criteria and insurance authorization.  Follow Up Recommendations  Home health PT;Supervision for mobility/OOB     Equipment Recommendations   (R platform)    Recommendations for Other Services       Precautions / Restrictions Precautions Precautions: Fall Restrictions Weight Bearing Restrictions: Yes RUE Weight Bearing: Weight bear through elbow only RLE Weight Bearing: Weight bearing as tolerated     Mobility  Bed Mobility Overal bed mobility: Needs Assistance Bed  Mobility: Supine to Sit     Supine to sit: Min assist;HOB elevated     General bed mobility comments: cuing for technique to maintain NWB through R wrist & weight bear through R elbow only, assistance to move RLE to EOB    Transfers Overall transfer level: Needs assistance Equipment used: Right platform walker Transfers: Sit to/from Stand Sit to Stand: Max assist         General transfer comment: cuing for hand/LE placement, max assist to transfer from sit>stand from low recliner  Ambulation/Gait Ambulation/Gait assistance: Min assist;+2 safety/equipment (2nd person for chair follow for safety) Gait Distance (Feet): 45 Feet Assistive device: Right platform walker Gait Pattern/deviations: Decreased step length - right;Decreased step length - left;Decreased stride length;Decreased dorsiflexion - right;Decreased dorsiflexion - left;Decreased weight shift to right Gait velocity: decreased   General Gait Details: decreased foot clearance RLE, pt is able to take side steps alone EOB & turn to sit in Dentist    Modified Rankin (Stroke Patients Only)       Balance Overall balance assessment: Needs assistance Sitting-balance support: Feet supported;Bilateral upper extremity supported Sitting balance-Leahy Scale: Fair Sitting balance - Comments: supervision static sitting   Standing balance support: During functional activity;Bilateral upper extremity supported Standing balance-Leahy Scale: Poor Standing balance comment: BUE support on R PFRW & min assist  Cognition Arousal/Alertness: Awake/alert Behavior During Therapy: WFL for tasks assessed/performed Overall Cognitive Status: Within Functional Limits for tasks assessed                                 General Comments: Pleasant gentleman, hard worker      Exercises General Exercises - Lower Extremity Ankle  Circles/Pumps: AROM;Right;10 reps;Supine Quad Sets: AROM;Right;10 reps;Supine Long Arc Quad: AROM;Strengthening;Right;10 reps;Seated Heel Slides: AAROM;Strengthening;Right;10 reps;Supine Hip ABduction/ADduction: AAROM;Strengthening;Right;10 reps;Supine    General Comments General comments (skin integrity, edema, etc.): Pt on 3L/min via nasal cannula, SpO2 drops to 86% after transferring to recliner but recovers to 89-91% within ~1 minute with cuing for pursed lip breathing; reviewed use of incentive spirometer.      Pertinent Vitals/Pain Pain Assessment: Faces Faces Pain Scale: Hurts even more Pain Location: RLE Pain Descriptors / Indicators: Discomfort;Grimacing Pain Intervention(s): Monitored during session;Limited activity within patient's tolerance;Repositioned    Home Living Family/patient expects to be discharged to:: Private residence Living Arrangements: Spouse/significant other Available Help at Discharge: Family (wife doesn't work but does have an "active pickleball & tennis schedule" as well as volunteers) Type of Home: House Home Access: Stairs to enter Entrance Stairs-Rails: None Home Layout: One level Home Equipment: Environmental consultant - 2 wheels      Prior Function Level of Independence: Independent with assistive device(s)      Comments: Ambulatory without AD, walks dog short distances.   PT Goals (current goals can now be found in the care plan section) Acute Rehab PT Goals Patient Stated Goal: get better, go home PT Goal Formulation: With patient Time For Goal Achievement: 02/24/21 Potential to Achieve Goals: Good Progress towards PT goals: Progressing toward goals    Frequency    BID      PT Plan Current plan remains appropriate    Co-evaluation              AM-PAC PT "6 Clicks" Mobility   Outcome Measure  Help needed turning from your back to your side while in a flat bed without using bedrails?: A Little Help needed moving from lying on your back  to sitting on the side of a flat bed without using bedrails?: A Lot Help needed moving to and from a bed to a chair (including a wheelchair)?: A Little Help needed standing up from a chair using your arms (e.g., wheelchair or bedside chair)?: A Lot Help needed to walk in hospital room?: A Lot Help needed climbing 3-5 steps with a railing? : Total 6 Click Score: 13    End of Session Equipment Utilized During Treatment: Gait belt;Oxygen Activity Tolerance: Patient tolerated treatment well;Treatment limited secondary to medical complications (Comment) Patient left: in chair;with call bell/phone within reach;with family/visitor present Nurse Communication: Mobility status (O2) PT Visit Diagnosis: Unsteadiness on feet (R26.81);Muscle weakness (generalized) (M62.81);Difficulty in walking, not elsewhere classified (R26.2);Pain Pain - Right/Left: Right Pain - part of body: Leg;Hip     Time: 1140-1205 PT Time Calculation (min) (ACUTE ONLY): 25 min  Charges:  $Therapeutic Activity: 23-37 mins                     Jordan Barber, PT, DPT 02/10/21, 12:22 PM    Jordan Barber 02/10/2021, 12:20 PM

## 2021-02-10 NOTE — TOC Initial Note (Addendum)
Transition of Care [facility]) - Initial/Assessment Note    Patient Details  Name: Jordan Barber MRN: [id number] Date of Birth: 26-Apr-1946  Transition of Care [facility]) CM/SW Contact:    Pete Pelt, RN Phone Number: 02/10/2021, 1:40 PM  Clinical Narrative:    Patient lives at home with wife.  Has no current home health services.  Patient has no concerns about transportation to appointments or to the pharmacy other than working with PT during inpatient stay to be able to navigate getting in and out of vehicles.    Patient states he has walker and BSC at home, will need platorm for walker.  Rhonda at [employer] aware  Addendum:  As per Suanne Marker at [facility], platform will be provided 10/18 as they are not instock  Patient will need Home Health, wife states this was set up through MD office.  RNCM contacted MD office, who confirmed HH was not pre arranged.  RNCM searching for [facility].    TOC contact information provided, TOC to follow to discharge.              Expected Discharge Plan: [place]     Patient Goals and CMS Choice     Choice offered to / list presented to : NA  Expected Discharge Plan and Services Expected Discharge Plan: [place]   Discharge Planning Services: CM Consult Post Acute Care Choice: Durable Medical Equipment, Home Health Living arrangements for the past 2 months: Single Family Home                 DME Arranged: Gilford Rile platform DME Agency: [facility] Date DME Agency Contacted: 02/10/21 Time DME Agency Contacted: 71 Representative spoke with at DME Agency: [facility] Arranged: PT, OT          Prior Living Arrangements/Services Living arrangements for the past 2 months: Single Family Home Lives with:: Self, Spouse Patient language and need for interpreter reviewed:: Yes (No interpreter required) Do you feel safe going back to the place where you live?: Yes      Need for Family Participation in Patient Care: Yes (Comment) Care  giver support system in place?: Yes (comment) Current home services: DME Criminal Activity/Legal Involvement Pertinent to Current Situation/Hospitalization: No - Comment as needed  Activities of Daily Living Home Assistive Devices/Equipment: None ADL Screening (condition at time of admission) Patient's cognitive ability adequate to safely complete daily activities?: Yes Is the patient deaf or have difficulty hearing?: No Does the patient have difficulty seeing, even when wearing glasses/contacts?: No Does the patient have difficulty concentrating, remembering, or making decisions?: No Patient able to express need for assistance with ADLs?: Yes Does the patient have difficulty dressing or bathing?: No Independently performs ADLs?: Yes (appropriate for developmental age) Does the patient have difficulty walking or climbing stairs?: No Weakness of Legs: Right Weakness of Arms/Hands: Right  Permission Sought/Granted Permission sought to share information with : Case Manager Permission granted to share information with : Yes, Verbal Permission Granted     Permission granted to share info w AGENCY: Home health, [facility]        Emotional Assessment Appearance:: Appears stated age Attitude/Demeanor/Rapport: Gracious, Engaged Affect (typically observed): Pleasant, Appropriate Orientation: : Oriented to Self, Oriented to Place, Oriented to Situation Alcohol / Substance Use: Illicit Drugs Psych Involvement: No (comment)  Admission diagnosis:  Hip fracture ([place]) [S72.009A] Closed fracture of femur, intertrochanteric, right, initial encounter ([place]) [S72.141A] Right wrist fracture, closed, initial encounter [S62.101A]  Patient Active Problem List   Diagnosis Date Noted   Other fractures of lower end of right radius, initial encounter for closed fracture 02/09/2021   Cigarette smoker 02/09/2021   Hyponatremia 02/09/2021   Aortic atherosclerosis ([place]) 02/09/2021   Emphysema ([place])  02/09/2021   Hip fracture, right, closed, initial encounter ([place]) 02/08/2021   Encounter for screening colonoscopy 12/15/2016   Personal history of tobacco use, presenting hazards to health 04/18/2016   Tibial plateau fracture, left 11/29/2015   PCP:  Albina Billet, MD Pharmacy:   [facility], [address] [place] [address] Phone: [phone] Fax: [place], [place] [address] [place] [address] [place] [address] Phone: [phone] Fax: [phone]  [place], [place] - [place] [address] Phone: [phone] Fax: [phone]     Social Determinants of Health (SDOH) Interventions    Readmission Risk Interventions No flowsheet data found.

## 2021-02-10 NOTE — Assessment & Plan Note (Signed)
--   New diagnosis, secondary to underlying emphysema, ongoing smoking, unmasked in current hospitalization secondary to acute injuries and decreased mobility. -- Suspect will need oxygen on discharge.

## 2021-02-10 NOTE — Evaluation (Signed)
Physical Therapy Evaluation Patient Details Name: Jordan Barber MRN: [id number] DOB: 1945/10/22 Today's Date: 02/10/2021  History of Present Illness  Pt is a 75 y/o M admitted on 02/08/21. Pt fell while walking his dog & had pain in R wrist & hip. Pt underwent R intertrochanteric IM nail & R wrist ORIF of distal radial fx. Pt can weight bear through RUE elbow to use platform walker but is otherwise NWB through RUE forearm, wrist, & hand. RLE WBAT. PMH: prostate CA, tobacco abuse  Clinical Impression  Pt seen for PT evaluation with wife present for session. Prior to admission, pt reports he was independent without AD, still driving. PT educated pt on precautions. Pt requires min assist for bed mobility & transfer to recliner. Pt endorses pain with weight bearing in RLE. Pt would benefit from ongoing acute PT services to progress independence with mobility, ongoing education re: safe mobility while maintaining precautions, and for stair negotiation as pt has 2 STE without rails at his home.   Pt c/o feeling lightheaded upon standing. Once sitting in recliner BP 122/63 mmHg MAP 81.        Recommendations for follow up therapy are one component of a multi-disciplinary discharge planning process, led by the attending physician.  Recommendations may be updated based on patient status, additional functional criteria and insurance authorization.  Follow Up Recommendations Home health PT;Supervision for mobility/OOB    Equipment Recommendations   (R platform for RW)    Recommendations for Other Services       Precautions / Restrictions Precautions Precautions: Fall Restrictions Weight Bearing Restrictions: Yes RUE Weight Bearing: Weight bear through elbow only RLE Weight Bearing: Weight bearing as tolerated      Mobility  Bed Mobility Overal bed mobility: Needs Assistance Bed Mobility: Supine to Sit     Supine to sit: Min assist;HOB elevated     General bed mobility comments: cuing for  technique to maintain NWB through R wrist & weight bear through R elbow only, assistance to move RLE to EOB    Transfers Overall transfer level: Needs assistance Equipment used: Right platform walker Transfers: Sit to/from Stand Sit to Stand: Min assist         General transfer comment: Education/demo & max cuing for technique to push to standing.  Ambulation/Gait Ambulation/Gait assistance: Min Web designer (Feet): 2 Feet Assistive device: Right platform walker Gait Pattern/deviations: Decreased step length - right;Decreased step length - left;Decreased stride length;Decreased dorsiflexion - right;Decreased dorsiflexion - left;Decreased weight shift to right Gait velocity: decreased   General Gait Details: decreased foot clearance RLE, pt is able to take side steps alone EOB & turn to sit in recliner  Stairs            Wheelchair Mobility    Modified Rankin (Stroke Patients Only)       Balance Overall balance assessment: Needs assistance Sitting-balance support: Feet supported;Bilateral upper extremity supported Sitting balance-Leahy Scale: Fair Sitting balance - Comments: supervision static sitting     Standing balance-Leahy Scale: Poor Standing balance comment: BUE support on R PFRW & min assist                             Pertinent Vitals/Pain Pain Assessment: Faces Faces Pain Scale: Hurts even more Pain Location: RLE Pain Descriptors / Indicators: Discomfort;Grimacing Pain Intervention(s): Limited activity within patient's tolerance;Monitored during session;Repositioned    Home Living Family/patient expects to be discharged to:: Private residence  Living Arrangements: Spouse/significant other Available Help at Discharge: Family (wife doesn't work but does have an "active pickleball & tennis schedule" as well as volunteers) Type of Home: House Home Access: Stairs to enter Entrance Stairs-Rails: None Entrance Stairs-Number of Steps:  2 Home Layout: One level Home Equipment: Walker - 2 wheels      Prior Function Level of Independence: Independent with assistive device(s)         Comments: Ambulatory without AD, walks dog short distances.     Hand Dominance   Dominant Hand: Left    Extremity/Trunk Assessment   Upper Extremity Assessment Upper Extremity Assessment: RUE deficits/detail RUE Deficits / Details: RUE in cast distal to elbow, can move fingers    Lower Extremity Assessment Lower Extremity Assessment: RLE deficits/detail RLE Deficits / Details: 2/5 knee extension in sitting    Cervical / Trunk Assessment Cervical / Trunk Assessment: Normal  Communication   Communication: No difficulties  Cognition Arousal/Alertness: Awake/alert Behavior During Therapy: WFL for tasks assessed/performed Overall Cognitive Status: Within Functional Limits for tasks assessed                                 General Comments: Pleasant gentleman, hard worker      General Comments General comments (skin integrity, edema, etc.): Pt on 3L/min via nasal cannula, SpO2 drops to 86% after transferring to recliner but recovers to 89-91% within ~1 minute with cuing for pursed lip breathing; reviewed use of incentive spirometer.    Exercises General Exercises - Lower Extremity Ankle Circles/Pumps: AROM;Right;10 reps;Supine Quad Sets: AROM;Right;10 reps;Supine Long Arc Quad: AROM;Strengthening;Right;10 reps;Seated Heel Slides: AAROM;Strengthening;Right;10 reps;Supine Hip ABduction/ADduction: AAROM;Strengthening;Right;10 reps;Supine   Assessment/Plan    PT Assessment Patient needs continued PT services  PT Problem List Decreased strength;Decreased mobility;Decreased activity tolerance;Decreased balance;Decreased knowledge of use of DME;Pain;Cardiopulmonary status limiting activity;Decreased knowledge of precautions       PT Treatment Interventions Therapeutic activities;DME instruction;Modalities;Gait  training;Therapeutic exercise;Patient/family education;Stair training;Balance training;Neuromuscular re-education;Manual techniques;Functional mobility training    PT Goals (Current goals can be found in the Care Plan section)  Acute Rehab PT Goals Patient Stated Goal: get better, go home PT Goal Formulation: With patient Time For Goal Achievement: 02/24/21 Potential to Achieve Goals: Good    Frequency BID   Barriers to discharge        Co-evaluation               AM-PAC PT "6 Clicks" Mobility  Outcome Measure Help needed turning from your back to your side while in a flat bed without using bedrails?: A Little Help needed moving from lying on your back to sitting on the side of a flat bed without using bedrails?: A Lot Help needed moving to and from a bed to a chair (including a wheelchair)?: A Little Help needed standing up from a chair using your arms (e.g., wheelchair or bedside chair)?: A Little Help needed to walk in hospital room?: A Lot Help needed climbing 3-5 steps with a railing? : Total 6 Click Score: 14    End of Session Equipment Utilized During Treatment: Gait belt Activity Tolerance: Patient tolerated treatment well Patient left: in chair;with call bell/phone within reach;with family/visitor present Nurse Communication: Mobility status PT Visit Diagnosis: Unsteadiness on feet (R26.81);Muscle weakness (generalized) (M62.81);Difficulty in walking, not elsewhere classified (R26.2);Pain Pain - Right/Left: Right Pain - part of body: Leg;Hip    Time: 4081-4481 PT Time Calculation (min) (ACUTE ONLY): 35 min  Charges:   PT Evaluation $PT Eval Low Complexity: 1 Low PT Treatments $Therapeutic Activity: 23-37 mins        Lavone Nian, PT, DPT 02/10/21, 10:42 AM   Waunita Schooner 02/10/2021, 10:41 AM

## 2021-02-11 ENCOUNTER — Encounter: Payer: Self-pay | Admitting: Surgery

## 2021-02-11 DIAGNOSIS — J9601 Acute respiratory failure with hypoxia: Secondary | ICD-10-CM | POA: Diagnosis not present

## 2021-02-11 DIAGNOSIS — E871 Hypo-osmolality and hyponatremia: Secondary | ICD-10-CM | POA: Diagnosis not present

## 2021-02-11 DIAGNOSIS — D62 Acute posthemorrhagic anemia: Secondary | ICD-10-CM

## 2021-02-11 DIAGNOSIS — S72141A Displaced intertrochanteric fracture of right femur, initial encounter for closed fracture: Secondary | ICD-10-CM | POA: Diagnosis not present

## 2021-02-11 LAB — BASIC METABOLIC PANEL
Anion gap: 5 (ref 5–15)
BUN: 11 mg/dL (ref 8–23)
CO2: 25 mmol/L (ref 22–32)
Calcium: 7.8 mg/dL — ABNORMAL LOW (ref 8.9–10.3)
Chloride: 97 mmol/L — ABNORMAL LOW (ref 98–111)
Creatinine, Ser: 0.55 mg/dL — ABNORMAL LOW (ref 0.61–1.24)
GFR, Estimated: >60 mL/min (ref >60)
Glucose, Bld: 99 mg/dL (ref 70–99)
Potassium: 4 mmol/L (ref 3.5–5.1)
Sodium: 127 mmol/L — ABNORMAL LOW (ref 135–145)

## 2021-02-11 LAB — CBC
HCT: 29.7 % — ABNORMAL LOW (ref 39.0–52.0)
Hemoglobin: 10.2 g/dL — ABNORMAL LOW (ref 13.0–17.0)
MCH: 33.3 pg (ref 26.0–34.0)
MCHC: 34.3 g/dL (ref 30.0–36.0)
MCV: 97.1 fL (ref 80.0–100.0)
Platelets: 121 10*3/uL — ABNORMAL LOW (ref 150–400)
RBC: 3.06 MIL/uL — ABNORMAL LOW (ref 4.22–5.81)
RDW: 13.2 % (ref 11.5–15.5)
WBC: 9.6 10*3/uL (ref 4.0–10.5)
nRBC: 0 % (ref 0.0–0.2)

## 2021-02-11 MED ORDER — ONDANSETRON HCL 4 MG PO TABS: 4.0000 mg | ORAL_TABLET | Freq: Four times a day (QID) | ORAL | 0 refills | Status: DC | PRN

## 2021-02-11 MED ORDER — SODIUM CHLORIDE 0.9 % IV SOLN: INTRAVENOUS | Status: DC

## 2021-02-11 MED ORDER — METHOCARBAMOL 750 MG PO TABS: 750.0000 mg | ORAL_TABLET | Freq: Three times a day (TID) | ORAL | 0 refills | Status: DC | PRN

## 2021-02-11 MED ORDER — ENOXAPARIN SODIUM 40 MG/0.4ML IJ SOSY: 40.0000 mg | PREFILLED_SYRINGE | INTRAMUSCULAR | 0 refills | Status: DC

## 2021-02-11 MED ORDER — SODIUM CHLORIDE 1 G PO TABS
1.0000 g | ORAL_TABLET | Freq: Two times a day (BID) | ORAL | Status: AC
  Administered 2021-02-12 – 2021-02-13 (×3): 1 g via ORAL
  Filled 2021-02-11 (×4): qty 1

## 2021-02-11 MED ORDER — HYDROCODONE-ACETAMINOPHEN 5-325 MG PO TABS: 1.0000 | ORAL_TABLET | ORAL | 0 refills | Status: DC | PRN

## 2021-02-11 NOTE — NC FL2 (Signed)
[place] LEVEL OF CARE SCREENING TOOL     IDENTIFICATION  Patient Name: Jordan Barber Birthdate: 1945-08-15 Sex: male Admission Date (Current Location): 02/08/2021  [facility] and [place] Number:  Engineering geologist and Address:  [facility], [address], [place],  [address]      Provider Number: [id number]  Attending Physician Name and Address:  Samuella Cota, MD  Relative Name and Phone Number:  Ravenscroft,Donna (Spouse)   [phone] (Mobile)    Current Level of Care: Hospital Recommended Level of Care: [place] Prior Approval Number:    Date Approved/Denied:   PASRR Number: [id number]  Discharge Plan: SNF    Current Diagnoses: Patient Active Problem List   Diagnosis Date Noted   Acute respiratory failure with hypoxia ([place]) 02/10/2021   Other fractures of lower end of right radius, initial encounter for closed fracture 02/09/2021   Cigarette smoker 02/09/2021   Hyponatremia 02/09/2021   Aortic atherosclerosis ([place]) 02/09/2021   Emphysema ([place]) 02/09/2021   Hip fracture, right, closed, initial encounter ([place]) 02/08/2021   Encounter for screening colonoscopy 12/15/2016   Personal history of tobacco use, presenting hazards to health 04/18/2016   Tibial plateau fracture, left 11/29/2015    Orientation RESPIRATION BLADDER Height & Weight     Self, Time, Situation, Place  O2 Continent Weight: 70.8 kg Height:  6' (182.9 cm)  BEHAVIORAL SYMPTOMS/MOOD NEUROLOGICAL BOWEL NUTRITION STATUS      Continent Diet (Heart)  AMBULATORY STATUS COMMUNICATION OF NEEDS Skin   Limited Assist Verbally Surgical wounds (right arm with splint and dressing, R hip with honeycomb dressing in place)                       Personal Care Assistance Level of Assistance  Bathing, Feeding, Dressing Bathing Assistance: Limited assistance Feeding assistance: Limited assistance Dressing Assistance: Limited assistance (Sit to  stand with max assist.  needs R platform walker)     Functional Limitations Info  Sight, Hearing, Speech Sight Info: Impaired Hearing Info: Adequate Speech Info: Adequate    SPECIAL CARE FACTORS FREQUENCY  PT (By licensed PT), OT (By licensed OT)     PT Frequency: Min 5x weekly OT Frequency: Min 5x weekly            Contractures Contractures Info: Not present    Additional Factors Info  Code Status, Allergies Code Status Info: FULL CODE Allergies Info: No known allergies           Current Medications (02/11/2021):  This is the current hospital active medication list Current Facility-Administered Medications  Medication Dose Route Frequency Provider Last Rate Last Admin   0.9 %  sodium chloride infusion   Intravenous Continuous Poggi, Marshall Cork, MD 75 mL/hr at 02/09/21 2351 Infusion Verify at 02/09/21 2351   0.9 %  sodium chloride infusion   Intravenous Continuous Samuella Cota, MD       acetaminophen (TYLENOL) tablet 325-650 mg  325-650 mg Oral Q6H PRN Poggi, Marshall Cork, MD       albuterol (PROVENTIL) (2.5 MG/3ML) 0.083% nebulizer solution 2.5 mg  2.5 mg Nebulization Q4H PRN Samuella Cota, MD       bisacodyl (DULCOLAX) suppository 10 mg  10 mg Rectal Daily PRN Poggi, Marshall Cork, MD       diphenhydrAMINE (BENADRYL) 12.5 MG/5ML elixir 12.5-25 mg  12.5-25 mg Oral Q4H PRN Poggi, Marshall Cork, MD       docusate sodium (COLACE)  capsule 100 mg  100 mg Oral BID Corky Mull, MD   100 mg at 02/11/21 0925   enoxaparin (LOVENOX) injection 40 mg  40 mg Subcutaneous Q24H Poggi, Marshall Cork, MD   40 mg at 02/11/21 0964   hydrALAZINE (APRESOLINE) injection 10 mg  10 mg Intravenous Q6H Poggi, Marshall Cork, MD   10 mg at 02/08/21 2331   HYDROcodone-acetaminophen (NORCO/VICODIN) 5-325 MG per tablet 1-2 tablet  1-2 tablet Oral Q4H PRN Poggi, Marshall Cork, MD   1 tablet at 02/11/21 3838   magnesium hydroxide (MILK OF MAGNESIA) suspension 30 mL  30 mL Oral Daily PRN Corky Mull, MD   30 mL at 02/11/21 1840    methocarbamol (ROBAXIN) tablet 750 mg  750 mg Oral Q8H PRN Poggi, Marshall Cork, MD   750 mg at 02/09/21 0518   morphine 2 MG/ML injection 1-2 mg  1-2 mg Intravenous Q2H PRN Poggi, Marshall Cork, MD       ondansetron [facility]) tablet 4 mg  4 mg Oral Q6H PRN Poggi, Marshall Cork, MD       Or   ondansetron (ZOFRAN) injection 4 mg  4 mg Intravenous Q6H PRN Poggi, Marshall Cork, MD       sodium phosphate (FLEET) 7-19 GM/118ML enema 1 enema  1 enema Rectal Once PRN Poggi, Marshall Cork, MD         Discharge Medications: Please see discharge summary for a list of discharge medications.  Relevant Imaging Results:  Relevant Lab Results:   Additional Information SSN Osgood Kristofer Schaffert, RN

## 2021-02-11 NOTE — Assessment & Plan Note (Signed)
--   Perioperative in nature, follow CBC

## 2021-02-11 NOTE — Progress Notes (Signed)
Physical Therapy Treatment Patient Details Name: Jordan Barber MRN: [id number] DOB: 1945/08/03 Today's Date: 02/11/2021   History of Present Illness Pt is a 75 y/o M admitted on 02/08/21. Pt fell while walking his dog & had pain in R wrist & hip. Pt underwent R intertrochanteric IM nail & R wrist ORIF of distal radial fx. Pt can weight bear through RUE elbow to use platform walker but is otherwise NWB through RUE forearm, wrist, & hand. RLE WBAT. PMH: prostate CA, tobacco abuse    PT Comments    Pt seen this am after being pre-medicated, remains on 2L O2 at time of visit. Jordan Barber present and many questions/concerns addressed. Pt required Min/ModA to transfer to EOB with use of rail and HOB raised.  Increased pain with movement of L hip to 3/10.  Pt required high bed height to stand with ModA to Right platform walker.  74ft of gait training tolerated due to pt c/o feeling "flushed" with HR increased to 105bpm, O2 sats 93% on 3L.  Once sitting BP at 104/68  HR back down to 91bpm.  Pt receiving sodium via IV due to low levels.  Pt left sitting up in chair, no complaints. Discussed d/c concerns at length. Pt is currently not safe for d/c home with assistance from Jordan Barber. He is unable to manage much mobility without significant help, weight bearing restrictions causing increased fall risk, and inability to negotiate stairs to enter home. Recommending SNF once medically stable.     Recommendations for follow up therapy are one component of a multi-disciplinary discharge planning process, led by the attending physician.  Recommendations may be updated based on patient status, additional functional criteria and insurance authorization.  Follow Up Recommendations  SNF     Equipment Recommendations       Recommendations for Other Services       Precautions / Restrictions Precautions Precautions: Fall Restrictions Weight Bearing Restrictions: Yes RUE Weight Bearing: Non weight bearing RLE Weight  Bearing: Weight bearing as tolerated     Mobility  Bed Mobility Overal bed mobility: Needs Assistance Bed Mobility: Supine to Sit     Supine to sit: Min assist;HOB elevated     General bed mobility comments: cuing for technique to maintain NWB through R wrist & weight bear through R elbow only, assistance to move RLE to EOB    Transfers Overall transfer level: Needs assistance Equipment used: Right platform walker Transfers: Sit to/from Stand Sit to Stand: Mod assist;From elevated surface         General transfer comment: cues for hand placement and technique  Ambulation/Gait Ambulation/Gait assistance: Min assist;+2 safety/equipment (pt followed with chair) Gait Distance (Feet): 8 Feet Assistive device: Right platform walker Gait Pattern/deviations: Decreased step length - right;Decreased step length - left;Decreased stride length;Decreased dorsiflexion - right;Decreased dorsiflexion - left;Decreased weight shift to right Gait velocity: decreased   General Gait Details:  (Pt c/o feeling "flushed" after beginning gt training, HR increased to 104bpm)   Stairs             Wheelchair Mobility    Modified Rankin (Stroke Patients Only)       Balance                                            Cognition Arousal/Alertness: Awake/alert Behavior During Therapy: WFL for tasks assessed/performed Overall Cognitive Status: Within Functional  Limits for tasks assessed                                 General Comments:  (treatment activity modified due to low sodium levels with symptoms upan ambulating)      Exercises General Exercises - Lower Extremity Ankle Circles/Pumps: AROM;Right;10 reps;Supine Quad Sets: AROM;Right;10 reps;Supine Heel Slides: AAROM;Strengthening;Right;10 reps;Supine Hip ABduction/ADduction: AAROM;Strengthening;Right;10 reps;Supine    General Comments General comments (skin integrity, edema, etc.):  (Increased  time spent answering pt and Jordan Barber's questions and concerns)      Pertinent Vitals/Pain Pain Assessment: 0-10 Pain Score: 3  Pain Location: RLE Pain Descriptors / Indicators: Discomfort;Grimacing Pain Intervention(s): Monitored during session;Premedicated before session    Home Living                      Prior Function            PT Goals (current goals can now be found in the care plan section) Acute Rehab PT Goals Patient Stated Goal: get better, go home Progress towards PT goals: Progressing toward goals    Frequency    BID      PT Plan Discharge plan needs to be updated    Co-evaluation              AM-PAC PT "6 Clicks" Mobility   Outcome Measure  Help needed turning from your back to your side while in a flat bed without using bedrails?: A Little Help needed moving from lying on your back to sitting on the side of a flat bed without using bedrails?: A Lot Help needed moving to and from a bed to a chair (including a wheelchair)?: A Little Help needed standing up from a chair using your arms (e.g., wheelchair or bedside chair)?: A Lot Help needed to walk in hospital room?: A Lot Help needed climbing 3-5 steps with a railing? : Total 6 Click Score: 13    End of Session Equipment Utilized During Treatment: Gait belt;Oxygen Activity Tolerance: Patient tolerated treatment well;Other (comment) (Pt remained on 3LO2 with O2 sats remaining in mid 90's during modified session) Patient left: in chair;with call bell/phone within reach;with family/visitor present Nurse Communication: Mobility status PT Visit Diagnosis: Unsteadiness on feet (R26.81);Muscle weakness (generalized) (M62.81);Difficulty in walking, not elsewhere classified (R26.2);Pain Pain - Right/Left: Right Pain - part of body: Leg;Hip     Time: 1105-1202 PT Time Calculation (min) (ACUTE ONLY): 57 min  Charges:  $Gait Training: 8-22 mins $Therapeutic Exercise: 8-22 mins $Therapeutic  Activity: 23-37 mins                    Jordan Barber, PTA    Jordan Barber 02/11/2021, 12:28 PM

## 2021-02-11 NOTE — TOC Progression Note (Signed)
Transition of Care [facility]) - Progression Note    Patient Details  Name: Jordan Barber MRN: [id number] Date of Birth: [birth year]  Transition of [facility]) CM/SW [place], RN Phone Number: 02/11/2021, 10:44 AM  Clinical Narrative:  Patient and spouse consent to bed search.     Expected Discharge Plan: [place]    Expected Discharge Plan and Services Expected Discharge Plan: [place]   Discharge Planning Services: CM Consult Post Acute Care Choice: Durable Medical Equipment, Home Health Living arrangements for the past 2 months: Single Family Home                 DME Arranged: Gilford Rile platform DME Agency: [employer] Date DME Agency Contacted: 02/10/21 Time DME Agency Contacted: 6 Representative spoke with at DME Agency: [place] Arranged: PT, OT           Social Determinants of Health ([place]) Interventions    Readmission Risk Interventions No flowsheet data found.

## 2021-02-11 NOTE — Discharge Instructions (Signed)
Diet: As you were doing prior to hospitalization   Shower:  May shower but keep the wounds dry, use an occlusive plastic wrap, NO SOAKING IN TUB.  If the bandage gets wet, change with a clean dry gauze.  Dressing:  Keep the right wrist dressing intact, don't change.  Activity:  Increase activity slowly as tolerated, but follow the weight bearing instructions below.  No lifting or driving for 6 weeks.  Weight Bearing:   Non-weightbearing to the right arm.  Weightbearing as tolerated to the right leg.  To prevent constipation: you may use a stool softener such as -  Colace (over the counter) 100 mg by mouth twice a day  Drink plenty of fluids (prune juice may be helpful) and high fiber foods Miralax (over the counter) for constipation as needed.    Itching:  If you experience itching with your medications, try taking only a single pain pill, or even half a pain pill at a time.  You may take up to 10 pain pills per day, and you can also use benadryl over the counter for itching or also to help with sleep.   Precautions:  If you experience chest pain or shortness of breath - call 911 immediately for transfer to the hospital emergency department!!  If you develop a fever greater that 101 F, purulent drainage from wound, increased redness or drainage from wound, or calf pain-Call [facility]                                              Follow- Up Appointment:  Please call for an appointment to be seen in 2 weeks at [facility]

## 2021-02-11 NOTE — Progress Notes (Signed)
  Progress Note Jordan Barber   FAO:[id number]  DOB: [birth year]  DOA: 02/08/2021     3 Date of Service: 02/11/2021   Clinical Course 75 year old man in relatively good health, presented after mechanical fall resulting in right wrist and hip pain. --10/15 admitted for right hip and wrist fracture --10/16 overall stable, plan for surgery today --10/17 new hypoxia, probably secondary to underlying emphysema, otherwise seems to be slowly improving although not ready to go home --10/18 plan for SNF now, condition stabilizing.  Assessment and Plan * Hip fracture, right, closed, initial encounter [facility]) -- Status post surgery 69/62 -- Complicated by acute blood loss anemia -- Continue hip fracture standard management, plan for SNF  ABLA -- Perioperative in nature, follow CBC  Acute respiratory failure with hypoxia (HCC) -- New diagnosis, secondary to underlying emphysema, ongoing smoking, unmasked in current hospitalization secondary to acute injuries and decreased mobility. -- Suspect will need oxygen on discharge.  Other fractures of lower end of right radius, initial encounter for closed fracture -- Operative intervention per orthopedics, continue pain control  Hyponatremia -- Mild, asymptomatic, appears to be chronic, follow-up as an outpatient  Emphysema ([place]) -- Mild, does not use any inhalers but activities are limited by breathing, recommend close outpatient follow-up, consider outpatient pulmonology evaluation  Aortic atherosclerosis ([place]) -- Resume Lipitor on discharge  Cigarette smoker -- Recommended cessation    Subjective:  Feels okay today, but struggled more with physical therapy.  Objective Vitals:   02/11/21 0434 02/11/21 0802 02/11/21 1148 02/11/21 1539  BP: 131/76 (!) 145/57 104/68 125/73  Pulse: 82 (!) 52 96 82  Resp: 18 18 16 20   Temp: 98.2 F (36.8 C) 97.8 F (36.6 C) 98 F (36.7 C) 98.2 F (36.8 C)  TempSrc: Oral     SpO2: 97% 94% 95% 98%  Weight:       Height:       70.8 kg  Vital signs were reviewed and unremarkable except for:   Requires supplemental oxygen.  Exam Physical Exam Constitutional:      General: He is not in acute distress.    Appearance: He is not ill-appearing or toxic-appearing.  Cardiovascular:     Rate and Rhythm: Normal rate and regular rhythm.     Heart sounds: No murmur heard. Pulmonary:     Effort: No respiratory distress.     Breath sounds: No wheezing, rhonchi or rales.     Comments: Mild shortness of breath. Psychiatric:        Mood and Affect: Mood normal.        Behavior: Behavior normal.    Labs / Other Information My review of labs, imaging, notes and other tests is significant for    sodium 127, hemoglobin 10.2  Disposition Plan: Status is: Inpatient  Remains inpatient appropriate because: Status post hip fracture repair, will need SNF  Full code Discussed with wife at bedside  Time spent: 20 minutes [facility] 02/11/2021, 5:15 PM

## 2021-02-11 NOTE — Progress Notes (Signed)
Physical Therapy Treatment Patient Details Name: Jordan Barber MRN: [id number] DOB: 09/15/1945 Today's Date: 02/11/2021   History of Present Illness Pt is a 75 y/o M admitted on 02/08/21. Pt fell while walking his dog & had pain in R wrist & hip. Pt underwent R intertrochanteric IM nail & R wrist ORIF of distal radial fx. Pt can weight bear through RUE elbow to use platform walker but is otherwise NWB through RUE forearm, wrist, & hand. RLE WBAT. PMH: prostate CA, tobacco abuse    PT Comments    Pt seen for PT after sitting up in chair for 2 1/2 hours. ModA to raise from recliner to [employer]. Pt on 3L O2 for gait training 18ft with decreased weight acceptance through R LE, CGA, and repeated vc's for PLB technique and pacing.  Pt returned to room and was assisted sit to supine with ModA primarily for R LE and due to increased fatigue.  Pt did not have c/o of feeling "flushed" this pm, however was quite fatigued and slightly SOB with O2 sats at 80% upon returning from walk. Pt focused on breathing technique while O2 was titrated up to 6L. Pt returned to 93% after 1 minute. Pt remains very motivated to improve and is looking forward to short term rehab.   Recommendations for follow up therapy are one component of a multi-disciplinary discharge planning process, led by the attending physician.  Recommendations may be updated based on patient status, additional functional criteria and insurance authorization.  Follow Up Recommendations  SNF     Equipment Recommendations       Recommendations for Other Services       Precautions / Restrictions Precautions Precautions: Fall Restrictions Weight Bearing Restrictions: Yes RUE Weight Bearing: Non weight bearing RLE Weight Bearing: Weight bearing as tolerated     Mobility  Bed Mobility Overal bed mobility: Needs Assistance Bed Mobility: Sit to Supine     Supine to sit: Mod assist          Transfers Overall transfer level: Needs  assistance Equipment used: Right platform walker Transfers: Sit to/from Stand Sit to Stand: Mod assist (fro recliner)         General transfer comment: cues for hand placement and technique  Ambulation/Gait Ambulation/Gait assistance: Min guard Gait Distance (Feet): 80 Feet Assistive device: Right platform walker Gait Pattern/deviations: Decreased stance time - right;Step-to pattern Gait velocity: decreased       Stairs             Wheelchair Mobility    Modified Rankin (Stroke Patients Only)       Balance                                            Cognition Arousal/Alertness: Awake/alert Behavior During Therapy: WFL for tasks assessed/performed Overall Cognitive Status: Within Functional Limits for tasks assessed                                 General Comments: Pleasant gentleman, hard worker      Exercises      General Comments        Pertinent Vitals/Pain Pain Assessment: 0-10 Pain Score: 6  Pain Location: RLE Pain Descriptors / Indicators: Discomfort;Grimacing Pain Intervention(s): Monitored during session;Patient requesting pain meds-RN notified    Home  Living                      Prior Function            PT Goals (current goals can now be found in the care plan section) Acute Rehab PT Goals Patient Stated Goal: get better, go home Progress towards PT goals: Progressing toward goals    Frequency    BID      PT Plan      Co-evaluation              AM-PAC PT "6 Clicks" Mobility   Outcome Measure  Help needed turning from your back to your side while in a flat bed without using bedrails?: A Little Help needed moving from lying on your back to sitting on the side of a flat bed without using bedrails?: A Lot Help needed moving to and from a bed to a chair (including a wheelchair)?: A Little Help needed standing up from a chair using your arms (e.g., wheelchair or bedside  chair)?: A Lot Help needed to walk in hospital room?: A Lot Help needed climbing 3-5 steps with a railing? : Total 6 Click Score: 13    End of Session Equipment Utilized During Treatment: Gait belt;Oxygen Activity Tolerance: Patient tolerated treatment well;Other (comment) Patient left: in bed;with call bell/phone within reach;with bed alarm set;with family/visitor present;with SCD's reapplied Nurse Communication: Mobility status PT Visit Diagnosis: Unsteadiness on feet (R26.81);Muscle weakness (generalized) (M62.81);Difficulty in walking, not elsewhere classified (R26.2);Pain Pain - Right/Left: Right Pain - part of body: Leg;Hip     Time: 7858-8502 PT Time Calculation (min) (ACUTE ONLY): 19 min  Charges:  $Gait Training: 8-22 mins                    Mikel Cella, PTA   Jordan Barber 02/11/2021, 4:58 PM

## 2021-02-11 NOTE — Progress Notes (Signed)
Subjective: 2 Days Post-Op Procedure(s) (LRB): INTRAMEDULLARY (IM) NAIL INTERTROCHANTRIC (Right) OPEN REDUCTION INTERNAL FIXATION (ORIF) DISTAL RADIAL FRACTURE (Right) Patient reports pain as moderate this AM compared to yesterday. Patient is well but requiring 3L of O2 at this time and is concerned about going home. PT and care management to assist with discharge planning, may need to discharge to SNF. Negative for chest pain at this time.  Denies any SOB except for when working with PT. Fever: no Gastrointestinal:Negative for nausea and vomiting Patient reports he is passing gas.  Objective: Vital signs in last 24 hours: Temp:  [97.8 F (36.6 C)-98.3 F (36.8 C)] 97.8 F (36.6 C) (10/18 0802) Pulse Rate:  [52-86] 52 (10/18 0802) Resp:  [14-19] 18 (10/18 0802) BP: (97-145)/(56-76) 145/57 (10/18 0802) SpO2:  [94 %-97 %] 94 % (10/18 0802)  Intake/Output from previous day:  Intake/Output Summary (Last 24 hours) at 02/11/2021 1102 Last data filed at 02/11/2021 0825 Gross per 24 hour  Intake 480 ml  Output 1000 ml  Net -520 ml    Intake/Output this shift: Total I/O In: -  Out: 350 [Urine:350]  Labs: Recent Labs    02/08/21 2103 02/09/21 0413 02/10/21 0405 02/11/21 0453  HGB 15.0 14.3 11.9* 10.2*   Recent Labs    02/10/21 0405 02/11/21 0453  WBC 9.5 9.6  RBC 3.45* 3.06*  HCT 34.3* 29.7*  PLT 124* 121*   Recent Labs    02/10/21 0405 02/11/21 0453  NA 131* 127*  K 4.1 4.0  CL 99 97*  CO2 25 25  BUN 12 11  CREATININE 0.68 0.55*  GLUCOSE 97 99  CALCIUM 8.1* 7.8*   No results for input(s): LABPT, INR in the last 72 hours.   EXAM General - Patient is Alert, Appropriate, and Oriented Extremity - ABD soft Neurovascular intact Dorsiflexion/Plantar flexion intact Incision: dressing C/D/I No cellulitis present Compartment soft Short arm splint applied to the right arm.  Swelling noted in fingers. Able to flex and extend fingers with mild pain. Intact  to light touch to the right hand. Dressing/Incision - No drainage noted to either the right arm splint or right leg. Motor Function - intact, moving foot and toes well on exam.  Abdomen soft with normal bowel sounds.  Past Medical History:  Diagnosis Date   Fall    Hip fracture, right, closed, initial encounter ([place]) 02/08/2021   Prostate cancer ([place])    Tobacco abuse     Assessment/Plan: 2 Days Post-Op Procedure(s) (LRB): INTRAMEDULLARY (IM) NAIL INTERTROCHANTRIC (Right) OPEN REDUCTION INTERNAL FIXATION (ORIF) DISTAL RADIAL FRACTURE (Right) Principal Problem:   Hip fracture, right, closed, initial encounter [facility]) Active Problems:   Other fractures of lower end of right radius, initial encounter for closed fracture   Cigarette smoker   Hyponatremia   Aortic atherosclerosis (HCC)   Acute respiratory failure with hypoxia (HCC)  Estimated body mass index is 21.17 kg/m as calculated from the following:   Height as of this encounter: 6' (1.829 m).   Weight as of this encounter: 70.8 kg. Advance diet Up with therapy D/C IV fluids when tolerating po intake.  Labs reviewed this AM, Hg 10.2 this AM. Up with therapy today.  Patient reports that therapy was difficult yesterday.  May need to discharge to rehab depending on his progress today. Begin working on BM.  He states that he is passing gas this morning. Will need to remain non-weightbearing to the right arm.  Walker with arm attachment will be needed.  DVT  Prophylaxis - Lovenox, Foot Pumps, and TED hose Weight-Bearing as tolerated to right leg Non-weightbearing to the right arm.  Raquel Lourdes Kucharski, PA-C [facility] 02/11/2021, 11:02 AM

## 2021-02-12 DIAGNOSIS — S72001A Fracture of unspecified part of neck of right femur, initial encounter for closed fracture: Secondary | ICD-10-CM | POA: Diagnosis not present

## 2021-02-12 LAB — IRON AND TIBC
Iron: 19 ug/dL — ABNORMAL LOW (ref 45–182)
Saturation Ratios: 10 % — ABNORMAL LOW (ref 17.9–39.5)
TIBC: 189 ug/dL — ABNORMAL LOW (ref 250–450)
UIBC: 170 ug/dL

## 2021-02-12 LAB — BASIC METABOLIC PANEL
Anion gap: 6 (ref 5–15)
BUN: 8 mg/dL (ref 8–23)
CO2: 25 mmol/L (ref 22–32)
Calcium: 7.9 mg/dL — ABNORMAL LOW (ref 8.9–10.3)
Chloride: 94 mmol/L — ABNORMAL LOW (ref 98–111)
Creatinine, Ser: 0.41 mg/dL — ABNORMAL LOW (ref 0.61–1.24)
GFR, Estimated: >60 mL/min (ref >60)
Glucose, Bld: 107 mg/dL — ABNORMAL HIGH (ref 70–99)
Potassium: 4.2 mmol/L (ref 3.5–5.1)
Sodium: 125 mmol/L — ABNORMAL LOW (ref 135–145)

## 2021-02-12 LAB — FOLATE: Folate: 7.5 ng/mL (ref >5.9)

## 2021-02-12 LAB — RETICULOCYTES
Immature Retic Fract: 12.2 % (ref 2.3–15.9)
RBC.: 2.87 MIL/uL — ABNORMAL LOW (ref 4.22–5.81)
Retic Count, Absolute: 60.3 10*3/uL (ref 19.0–186.0)
Retic Ct Pct: 2.1 % (ref 0.4–3.1)

## 2021-02-12 LAB — VITAMIN B12: Vitamin B-12: 145 pg/mL — ABNORMAL LOW (ref 180–914)

## 2021-02-12 LAB — FERRITIN: Ferritin: 425 ng/mL — ABNORMAL HIGH (ref 24–336)

## 2021-02-12 MED ORDER — FLUTICASONE PROPIONATE 50 MCG/ACT NA SUSP
2.0000 | Freq: Every day | NASAL | Status: DC
  Administered 2021-02-12 – 2021-02-14 (×3): 2 via NASAL
  Filled 2021-02-12: qty 16

## 2021-02-12 MED ORDER — GUAIFENESIN-DM 100-10 MG/5ML PO SYRP
5.0000 mL | ORAL_SOLUTION | ORAL | Status: DC | PRN
  Administered 2021-02-12: 5 mL via ORAL
  Filled 2021-02-12: qty 5

## 2021-02-12 MED ORDER — SALINE SPRAY 0.65 % NA SOLN
1.0000 | NASAL | Status: DC | PRN
  Filled 2021-02-12: qty 44

## 2021-02-12 MED ORDER — LORATADINE 10 MG PO TABS
10.0000 mg | ORAL_TABLET | Freq: Every day | ORAL | Status: DC
  Administered 2021-02-12 – 2021-02-14 (×3): 10 mg via ORAL
  Filled 2021-02-12 (×3): qty 1

## 2021-02-12 NOTE — TOC Progression Note (Signed)
Transition of Care [facility]) - Progression Note    Patient Details  Name: RECE ZECHMAN MRN: [id number] Date of Birth: 03-16-1946  Transition of Care [facility]) CM/SW [place], RN Phone Number: 02/12/2021, 1:49 PM  Clinical Narrative:   Patient and family chose [facility].  Magda Paganini at [facility] aware, authorization started.  TOC to follow    Expected Discharge Plan: [place]    Expected Discharge Plan and Services Expected Discharge Plan: Person   Discharge Planning Services: CM Consult Post Acute Care Choice: Durable Medical Equipment, Home Health Living arrangements for the past 2 months: Single Family Home                 DME Arranged: [employer] Rile platform DME Agency: [employer] Date DME Agency Contacted: 02/10/21 Time DME Agency Contacted: 93 Representative spoke with at DME Agency: [place] Arranged: PT, OT           Social Determinants of Health ([place]) Interventions    Readmission Risk Interventions No flowsheet data found.

## 2021-02-12 NOTE — Care Management Important Message (Signed)
Important Message  Patient Details  Name: Jordan Barber MRN: [id number] Date of Birth: 02-25-1946   Medicare Important Message Given:  Yes     [name] 02/12/2021, 3:23 PM

## 2021-02-12 NOTE — Progress Notes (Signed)
PROGRESS NOTE    Jordan Barber  VXB:[id number] DOB: Sep 20, 1945 DOA: 02/08/2021 PCP: Albina Billet, MD   Brief Narrative: Taken from prior notes. 75 year old man in relatively good health, presented after mechanical fall resulting in right wrist and hip pain. --10/15 admitted for right hip and wrist fracture. Taken to the OR for ORIF on 02/09/2021.  Patient developed acute hypoxic respiratory failure requiring 2 to 3 L of oxygen postoperatively, has an history of emphysema but he was not on any oxygen at baseline.  Now stable but do desaturate while working with PT. He will be discharged to SNF on oxygen where they can try to wean him off if possible.  10/19: Wife and PT were concern about coughing with thin liquids, going on for a while at home.  Swallow evaluation was obtained but he is low risk for aspiration and they put some recommendations to follow while feeding to avoid the risk of aspiration.  They are also recommending GI follow-up for any esophageal dysmotility which can be done as an outpatient.  Subjective: Patient was seen and examined today.  He did not had any concerns.  Pain is well controlled.  Per PT his wife has a concern of coughing with thin liquids.  No fever.  Assessment & Plan:   Principal Problem:   Hip fracture, right, closed, initial encounter [facility]) Active Problems:   Other fractures of lower end of right radius, initial encounter for closed fracture   Cigarette smoker   Hyponatremia   Aortic atherosclerosis (HCC)   Acute respiratory failure with hypoxia (HCC)   ABLA  Right hip fracture: Secondary to mechanical fall, s/p ORIF. Postoperative course complicated with acute blood loss anemia and acute hypoxic respiratory failure requiring oxygen.  Currently stable. PT is recommending SNF-awaiting placement. -Continue with PT -Continue with pain management  Right lower radius fracture.  Secondary to the same fall which resulted in right hip fracture.  Being  managed by orthopedic. -Continue with pain management  Concern of aspiration.  Because of wife's concern of coughing with thin liquid we obtain swallow evaluation which shows low risk for aspiration and they did not put their recommendations in order to decrease her risk.  They were also recommending GI evaluation for esophageal dysmotility which can be done as an outpatient.  Acute blood loss anemia.  Likely postoperatively and fall. -Check anemia panel -Monitor hemoglobin -Transfuse if below 7  Acute hypoxic respiratory failure.  Developed postoperatively with history of underlying emphysema.  Not on any home oxygen.  Currently stable on 2 L of oxygen while resting. Per PT patient do desaturate with ambulation while working with them. -Continue with supplemental oxygen to keep the saturation above 90%-most likely will be discharged to SNF with oxygen and can be weaned off over there.  Aortic atherosclerosis. -Continue Lipitor  Objective: Vitals:   02/12/21 0724 02/12/21 0857 02/12/21 1159 02/12/21 1530  BP: 122/68  125/63 113/72  Pulse: 78  79 76  Resp: 16  16 16   Temp: 98.2 F (36.8 C)  98.3 F (36.8 C) 98.2 F (36.8 C)  TempSrc: Oral  Oral   SpO2:  96% 100% 99%  Weight:      Height:        Intake/Output Summary (Last 24 hours) at 02/12/2021 1546 Last data filed at 02/12/2021 0400 Gross per 24 hour  Intake --  Output 650 ml  Net -650 ml   Filed Weights   02/08/21 2105 02/09/21 0034  Weight: 68.9 kg  70.8 kg    Examination:  General exam: Appears calm and comfortable  Respiratory system: Clear to auscultation. Respiratory effort normal. Cardiovascular system: S1 & S2 heard, RRR.  Gastrointestinal system: Soft, nontender, nondistended, bowel sounds positive. Central nervous system: Alert and oriented. No focal neurological deficits. Extremities: No edema, no cyanosis, pulses intact and symmetrical. Psychiatry: Judgement and insight appear normal.   DVT  prophylaxis: Lovenox Code Status: Full Family Communication: Called wife with no response. Disposition Plan:  Status is: Inpatient  Remains inpatient appropriate because: Currently stable for discharge, awaiting SNF placement.  Level of care: Med-Surg  All the records are reviewed and case discussed with Care Management/Social Worker. Management plans discussed with the patient, nursing and they are in agreement.  Consultants:  Orthopedic surgery  Procedures:  Antimicrobials:   Data Reviewed: I have personally reviewed following labs and imaging studies  CBC: Recent Labs  Lab 02/08/21 2103 02/09/21 0413 02/10/21 0405 02/11/21 0453  WBC 9.8 12.6* 9.5 9.6  NEUTROABS 5.3  --   --   --   HGB 15.0 14.3 11.9* 10.2*  HCT 41.5 42.8 34.3* 29.7*  MCV 96.1 99.1 99.4 97.1  PLT 207 179 124* 034*   Basic Metabolic Panel: Recent Labs  Lab 02/08/21 2103 02/09/21 0413 02/10/21 0405 02/11/21 0453 02/12/21 0300  NA 131* 128* 131* 127* 125*  K 4.1 3.9 4.1 4.0 4.2  CL 97* 98 99 97* 94*  CO2 28 24 25 25 25   GLUCOSE 100* 107* 97 99 107*  BUN 14 12 12 11 8   CREATININE 0.74 0.63 0.68 0.55* 0.41*  CALCIUM 9.1 8.8* 8.1* 7.8* 7.9*   GFR: Estimated Creatinine Clearance: 82.4 mL/min (A) (by C-G formula based on SCr of 0.41 mg/dL (L)). Liver Function Tests: No results for input(s): AST, ALT, ALKPHOS, BILITOT, PROT, ALBUMIN in the last 168 hours. No results for input(s): LIPASE, AMYLASE in the last 168 hours. No results for input(s): AMMONIA in the last 168 hours. Coagulation Profile: No results for input(s): INR, PROTIME in the last 168 hours. Cardiac Enzymes: No results for input(s): CKTOTAL, CKMB, CKMBINDEX, TROPONINI in the last 168 hours. BNP (last 3 results) No results for input(s): PROBNP in the last 8760 hours. HbA1C: No results for input(s): HGBA1C in the last 72 hours. CBG: No results for input(s): GLUCAP in the last 168 hours. Lipid Profile: No results for input(s):  CHOL, HDL, LDLCALC, TRIG, CHOLHDL, LDLDIRECT in the last 72 hours. Thyroid Function Tests: No results for input(s): TSH, T4TOTAL, FREET4, T3FREE, THYROIDAB in the last 72 hours. Anemia Panel: No results for input(s): VITAMINB12, FOLATE, FERRITIN, TIBC, IRON, RETICCTPCT in the last 72 hours. Sepsis Labs: No results for input(s): PROCALCITON, LATICACIDVEN in the last 168 hours.  Recent Results (from the past 240 hour(s))  Resp Panel by RT-PCR (Flu A&B, Covid) Nasopharyngeal Swab     Status: None   Collection Time: 02/08/21 10:09 PM   Specimen: Nasopharyngeal Swab; Nasopharyngeal(NP) swabs in vial transport medium  Result Value Ref [place] Status   SARS Coronavirus 2 by RT PCR NEGATIVE NEGATIVE Final    Comment: (NOTE) SARS-CoV-2 target nucleic acids are NOT DETECTED.  The SARS-CoV-2 RNA is generally detectable in upper respiratory specimens during the acute phase of infection. The lowest concentration of SARS-CoV-2 viral copies this assay can detect is 138 copies/mL. A negative result does not preclude SARS-Cov-2 infection and should not be used as the sole basis for treatment or other patient management decisions. A negative result may occur with  improper specimen collection/handling, submission of specimen other than nasopharyngeal swab, presence of viral mutation(s) within the areas targeted by this assay, and inadequate number of viral copies(<138 copies/mL). A negative result must be combined with clinical observations, patient history, and epidemiological information. The expected result is Negative.  Fact Sheet for Patients:  EntrepreneurPulse.com.au  Fact Sheet for Healthcare Providers:  IncredibleEmployment.be  This test is no t yet approved or cleared by the [place] FDA and  has been authorized for detection and/or diagnosis of SARS-CoV-2 by FDA under an Emergency Use Authorization (EUA). This EUA will remain  in effect (meaning  this test can be used) for the duration of the COVID-19 declaration under Section 564(b)(1) of the Act, 21 U.S.C.section 360bbb-3(b)(1), unless the authorization is terminated  or revoked sooner.       Influenza A by PCR NEGATIVE NEGATIVE Final   Influenza B by PCR NEGATIVE NEGATIVE Final    Comment: (NOTE) The Xpert Xpress SARS-CoV-2/FLU/RSV plus assay is intended as an aid in the diagnosis of influenza from Nasopharyngeal swab specimens and should not be used as a sole basis for treatment. Nasal washings and aspirates are unacceptable for Xpert Xpress SARS-CoV-2/FLU/RSV testing.  Fact Sheet for Patients: EntrepreneurPulse.com.au  Fact Sheet for Healthcare Providers: IncredibleEmployment.be  This test is not yet approved or cleared by the [place] FDA and has been authorized for detection and/or diagnosis of SARS-CoV-2 by FDA under an Emergency Use Authorization (EUA). This EUA will remain in effect (meaning this test can be used) for the duration of the COVID-19 declaration under Section 564(b)(1) of the Act, 21 U.S.C. section 360bbb-3(b)(1), unless the authorization is terminated or revoked.  Performed at [facility], [address] [place], [place] [address]   Surgical PCR screen     Status: None   Collection Time: 02/09/21  1:04 AM   Specimen: Nasal Mucosa; Nasal Swab  Result Value Ref [place] Status   MRSA, PCR NEGATIVE NEGATIVE Final   Staphylococcus aureus NEGATIVE NEGATIVE Final    Comment: (NOTE) The Xpert SA Assay (FDA approved for NASAL specimens in patients 65 years of age and older), is one component of a comprehensive surveillance program. It is not intended to diagnose infection nor to guide or monitor treatment. Performed at [facility], [address], [place], [place] [address]      Radiology Studies: No results found.  Scheduled Meds:  docusate sodium  100 mg Oral BID   enoxaparin  (LOVENOX) injection  40 mg Subcutaneous Q24H   fluticasone  2 spray Each Nare Daily   hydrALAZINE  10 mg Intravenous Q6H   loratadine  10 mg Oral Daily   sodium chloride  1 g Oral BID WC   Continuous Infusions:   LOS: 4 days   Time spent: 38 minutes More than 50% of the time was spent in counseling/coordination of care  Lorella Nimrod, MD [facility]  If 7PM-7AM, please contact night-coverage [email or web address]  02/12/2021, 3:46 PM   This record has been created using Systems analyst. Errors have been sought and corrected,but may not always be located. Such creation errors do not reflect on the standard of care.

## 2021-02-12 NOTE — Progress Notes (Signed)
Vitals entered manually ° °

## 2021-02-12 NOTE — Evaluation (Signed)
Clinical/Bedside Swallow Evaluation Patient Details  Name: Jordan Barber MRN: [id number] Date of Birth: Jun 07, 1945  Today's Date: 02/12/2021 Time: SLP Start Time (ACUTE ONLY): 1245 SLP Stop Time (ACUTE ONLY): 1345 SLP Time Calculation (min) (ACUTE ONLY): 60 min  Past Medical History:  Past Medical History:  Diagnosis Date   Fall    Hip fracture, right, closed, initial encounter ([place]) 02/08/2021   Prostate cancer ([place])    Tobacco abuse    Past Surgical History:  Past Surgical History:  Procedure Laterality Date   COLONOSCOPY  10/18/2006   COLONOSCOPY WITH PROPOFOL N/A 02/03/2017   Procedure: COLONOSCOPY WITH PROPOFOL;  Surgeon: Robert Bellow, MD;  Location: [facility] ENDOSCOPY;  Service: Endoscopy;  Laterality: N/A;   INTRAMEDULLARY (IM) NAIL INTERTROCHANTERIC Right 02/09/2021   Procedure: INTRAMEDULLARY (IM) NAIL INTERTROCHANTRIC;  Surgeon: Corky Mull, MD;  Location: [facility] ORS;  Service: Orthopedics;  Laterality: Right;   OPEN REDUCTION INTERNAL FIXATION (ORIF) DISTAL RADIAL FRACTURE Right 02/09/2021   Procedure: OPEN REDUCTION INTERNAL FIXATION (ORIF) DISTAL RADIAL FRACTURE;  Surgeon: Corky Mull, MD;  Location: [facility] ORS;  Service: Orthopedics;  Laterality: Right;   ORIF TIBIA PLATEAU Left 11/29/2015   Procedure: OPEN REDUCTION INTERNAL FIXATION (ORIF) TIBIAL PLATEAU;  Surgeon: Corky Mull, MD;  Location: [facility] ORS;  Service: Orthopedics;  Laterality: Left;   PROSTATE SURGERY  2011   HPI:  Pt is a 75 y.o. male with h/o tobacco abuse and prostate Ca who presents to the ED via EMS from home, patient presents following a mechanical fall.  He was apparently walking his dog who pulled his leash, causing the patient to fall.  He denies any head injury or LOC.  He presents with primary complaints of pain to the right wrist as well as the right hip. He denies blood thinner use.  Pt is post op Day 3: INTRAMEDULLARY (IM) NAIL INTERTROCHANTRIC (Right)  OPEN REDUCTION INTERNAL FIXATION (ORIF)  DISTAL RADIAL FRACTURE (Right)  Patient reports pain as moderate this AM compared to yesterday.  Patient is well but requiring 3L of O2 at this time.   CXR at admit: No active disease.    Assessment / Plan / Recommendation  Clinical Impression  Pt appears to present w/ adequate oropharyngeal phase swallow function w/ No oropharyngeal phase dysphagia noted, No neuromuscular deficits noted. Pt consumed po trials w/ No overt, clinical s/s of aspiration during po trials. Pt appears at reduced risk for aspiration following general aspiration precautions. Pt is on O2 support via [place], 2L. He c/o runny nose.  OF NOTE: pt and Wife c/o pt having "choking" episodes during meals w/ foods/liquids together for "~2 years now". He has not seen a MD for this. Pt could not specify certain foods but stated it "feels like it stops at the top of my Esophagus when I eat and the coughing begins". Pt does drink Carbonated sodas/drinks w/ meals at home. He had a Upper Endoscopy in 2008 for possible r/o of Barrett's Esophagus, per chart notes. He is Not on a PPI per chart/report.     During po trials, pt consumed all consistencies w/ no overt coughing, decline in vocal quality, or change in respiratory presentation during/post trials. Oral phase appeared [facility] w/ timely bolus management, mastication, and control of bolus propulsion for A-P transfer for swallowing. Oral clearing achieved w/ all trial consistencies. OM Exam appeared [facility] w/ no unilateral weakness noted. Speech Clear. Pt fed self w/ setup support d/t wrist fx.     Recommend continue  a Regular consistency diet w/ well-Cut meats, moistened foods; Thin liquids VIA CUP - less straw use. Recommend general aspiration and Reflux precautions. Reduce distractions at meals. Education given on Pills in Puree; food consistencies and easy to eat options; general aspiration precautions; need to monitor eating behaviors when any coughing w/ foods/liquids occurs at home. Encouraged NO  Talking and sitting down to focus on the meal/eating; less TV watching if necessary. NSG to reconsult if any new needs arise. Pt and Wife agreed. NSG agreed. SLP Visit Diagnosis: Dysphagia, unspecified (R13.10)    Aspiration Risk   (reduced following general precautions)    Diet Recommendation   Regular consistency diet w/ well-Cut meats, moistened foods; Thin liquids VIA CUP - less straw use. Recommend general aspiration and Reflux precautions. Reduce distractions at meals.   Medication Administration: Whole meds with liquid (use of a Puree if needed also)    Other  Recommendations Recommended Consults: Consider GI evaluation (to r/o Esophageal Dysmotility; Reflux if indicated) Oral Care Recommendations: Oral care BID;Oral care before and after PO;Patient independent with oral care Other Recommendations:  (n/a)    Recommendations for follow up therapy are one component of a multi-disciplinary discharge planning process, led by the attending physician.  Recommendations may be updated based on patient status, additional functional criteria and insurance authorization.  Follow up Recommendations None      Frequency and Duration  (n/a)   (n/a)       Prognosis Prognosis for Safe Diet Advancement: Good Barriers to Reach Goals:  (none)      Swallow Study   General Date of Onset: 02/08/21 HPI: Pt is a 75 y.o. male with h/o tobacco abuse and prostate Ca who presents to the ED via EMS from home, patient presents following a mechanical fall.  He was apparently walking his dog who pulled his leash, causing the patient to fall.  He denies any head injury or LOC.  He presents with primary complaints of pain to the right wrist as well as the right hip. He denies blood thinner use.  Pt is post op Day 3: INTRAMEDULLARY (IM) NAIL INTERTROCHANTRIC (Right)  OPEN REDUCTION INTERNAL FIXATION (ORIF) DISTAL RADIAL FRACTURE (Right)  Patient reports pain as moderate this AM compared to yesterday.  Patient is  well but requiring 3L of O2 at this time.   CXR at admit: No active disease. Type of Study: Bedside Swallow Evaluation Previous Swallow Assessment: none Diet Prior to this Study: Regular;Thin liquids Temperature Spikes Noted: No (wbc 9.6) Respiratory Status: Nasal cannula (2L) History of Recent Intubation: No Behavior/Cognition: Alert;Cooperative;Pleasant mood Oral Cavity Assessment: Within Functional Limits Oral Care Completed by SLP: Recent completion by staff Oral Cavity - Dentition: Adequate natural dentition Vision: Functional for self-feeding Self-Feeding Abilities: Able to feed self;Needs set up (wrist fx/surgery) Patient Positioning: Upright in chair Baseline Vocal Quality: Normal Volitional Cough: Strong Volitional Swallow: Able to elicit    Oral/Motor/Sensory Function Overall Oral Motor/Sensory Function: Within functional limits   Ice Chips Ice chips: Not tested   Thin Liquid Thin Liquid: Within functional limits Presentation: Cup;Self Fed (~6 ozs total) Other Comments: tea/water    Nectar Thick Nectar Thick Liquid: Not tested   Honey Thick Honey Thick Liquid: Not tested   Puree Puree: Not tested   Solid     Solid: Within functional limits Presentation: Self Fed;Spoon (20+ bites) Other Comments: pizza and soup        Orinda Kenner, MS, Development worker, international aid Language Pathologist [facility] Services [phone] [place] 02/12/2021,2:44 PM

## 2021-02-12 NOTE — Progress Notes (Signed)
Physical Therapy Treatment Patient Details Name: Jordan Barber MRN: [id number] DOB: 13-Nov-1945 Today's Date: 02/12/2021   History of Present Illness Pt is a 75 y/o M admitted on 02/08/21. Pt fell while walking his dog & had pain in R wrist & hip. Pt underwent R intertrochanteric IM nail & R wrist ORIF of distal radial fx. Pt can weight bear through RUE elbow to use platform walker but is otherwise NWB through RUE forearm, wrist, & hand. RLE WBAT. PMH: prostate CA, tobacco abuse    PT Comments    Pt seen this am. Received in bed, 2L O2 with sats in mid 90's. Pt easily SOB and fatigued with minimal activity. Productive and non-productive cough upon exertion and while drinking thin liquids from a straw despite cues for chin tuck. MD notified and SLP consulted this am. Pt continues to require Jordan Barber for bed mobility and transfers from raised surface. Repeated cues to refrain from putting weight through R UE.  Pt continues to experience 6/10 R hip pain with ROM/gait, however remains very motivated and willing to work hard to attain functional goals.  Pt is an excellent STR candidate.   Recommendations for follow up therapy are one component of a multi-disciplinary discharge planning process, led by the attending physician.  Recommendations may be updated based on patient status, additional functional criteria and insurance authorization.  Follow Up Recommendations  SNF     Equipment Recommendations  None recommended by PT    Recommendations for Other Services       Precautions / Restrictions Precautions Precautions: Fall Restrictions Weight Bearing Restrictions: Yes RUE Weight Bearing: Non weight bearing RLE Weight Bearing: Weight bearing as tolerated     Mobility  Bed Mobility Overal bed mobility: Needs Assistance Bed Mobility: Supine to Sit     Supine to sit: Mod assist;HOB elevated     General bed mobility comments:  (Use of chuck pad to gently slide pt to EOB)     Transfers Overall transfer level: Needs assistance Equipment used: Right platform walker Transfers: Sit to/from Stand Sit to Stand: Mod assist;From elevated surface         General transfer comment: cues for hand placement and technique  Ambulation/Gait Ambulation/Gait assistance: Min guard Gait Distance (Feet): 4 Feet Assistive device: Right platform walker Gait Pattern/deviations: Decreased stance time - right;Step-to pattern Gait velocity: decreased   General Gait Details:  (improved foot clearance R LE)   Stairs             Wheelchair Mobility    Modified Rankin (Stroke Patients Only)       Balance Overall balance assessment: Needs assistance Sitting-balance support: Feet supported;Single extremity supported Sitting balance-Leahy Scale: Good Sitting balance - Comments:  (supervision for static sitting x 10 minutes)                                    Cognition Arousal/Alertness: Awake/alert Behavior During Therapy: WFL for tasks assessed/performed Overall Cognitive Status: Within Functional Limits for tasks assessed                                 General Comments: Pleasant gentleman, hard worker      Exercises General Exercises - Lower Extremity Ankle Circles/Pumps: AROM;Both;15 reps;Supine Quad Sets: AROM;Both;10 reps;Supine Gluteal Sets: AROM;Both;10 reps;Supine Heel Slides: AAROM;Strengthening;Right;10 reps;Supine Hip ABduction/ADduction: AAROM;Strengthening;Right;10 reps;Supine  General Comments General comments (skin integrity, edema, etc.):  (Pt remained on 2L O2 with increased SOB and productive cough (clear) sats remained above 93%.  Pt also coughing with every sip of water through a straw despite encouraging chin tuck. MD notified for possible SLP eval)      Pertinent Vitals/Pain Pain Assessment: 0-10 Pain Score: 6  Pain Location: RLE Pain Descriptors / Indicators: Discomfort;Grimacing Pain  Intervention(s): Monitored during session    Home Living                      Prior Function            PT Goals (current goals can now be found in the care plan section) Acute Rehab PT Goals Patient Stated Goal: get better, go home Progress towards PT goals: Progressing toward goals    Frequency    BID      PT Plan      Co-evaluation              AM-PAC PT "6 Clicks" Mobility   Outcome Measure  Help needed turning from your back to your side while in a flat bed without using bedrails?: A Little Help needed moving from lying on your back to sitting on the side of a flat bed without using bedrails?: A Lot Help needed moving to and from a bed to a chair (including a wheelchair)?: A Little Help needed standing up from a chair using your arms (e.g., wheelchair or bedside chair)?: A Lot Help needed to walk in hospital room?: A Lot Help needed climbing 3-5 steps with a railing? : Total 6 Click Score: 13    End of Session Equipment Utilized During Treatment: Gait belt;Oxygen Activity Tolerance: Patient tolerated treatment well;Other (comment) Patient left: in chair;with call bell/phone within reach;with chair alarm set;with nursing/sitter in room Nurse Communication: Mobility status PT Visit Diagnosis: Unsteadiness on feet (R26.81);Muscle weakness (generalized) (M62.81);Difficulty in walking, not elsewhere classified (R26.2);Pain Pain - Right/Left: Right Pain - part of body: Leg;Hip     Time: 0930-1016 PT Time Calculation (min) (ACUTE ONLY): 46 min  Charges:  $Gait Training: 8-22 mins $Therapeutic Exercise: 8-22 mins $Therapeutic Activity: 8-22 mins                    Jordan Barber, PTA    Jordan Barber 02/12/2021, 11:44 AM

## 2021-02-12 NOTE — Progress Notes (Signed)
Physical Therapy Treatment Patient Details Name: Jordan Barber MRN: [id number] DOB: [birth year] Today's Date: 02/12/2021   History of Present Illness Pt is a 75 y/o M admitted on 02/08/21. Pt fell while walking his dog & had pain in R wrist & hip. Pt underwent R intertrochanteric IM nail & R wrist ORIF of distal radial fx. Pt can weight bear through RUE elbow to use platform walker but is otherwise NWB through RUE forearm, wrist, & hand. RLE WBAT. PMH: prostate CA, tobacco abuse    PT Comments    Increased activity tolerance this pm. Pt with less c/o trouble breathing in through his nose after claritin and nasal spray. Pt remained on 2L O2 throughout session, ambulating 50ft with RW and slow controled gait while remaining at 94% on pulse ox. Pt was fatigued with increased SOB however less anxious and more confident with mobility.  Pt will benefit from continuing PT services at SNF in order to regain independent functional mobility prior to returning home with wife.   Recommendations for follow up therapy are one component of a multi-disciplinary discharge planning process, led by the attending physician.  Recommendations may be updated based on patient status, additional functional criteria and insurance authorization.  Follow Up Recommendations  SNF     Equipment Recommendations  None recommended by PT    Recommendations for Other Services       Precautions / Restrictions Precautions Precautions: Fall Restrictions Weight Bearing Restrictions: Yes RUE Weight Bearing: Non weight bearing RLE Weight Bearing: Weight bearing as tolerated     Mobility  Bed Mobility Overal bed mobility: Needs Assistance Bed Mobility: Sit to Supine     Supine to sit: Mod assist (primarily for LE's)     General bed mobility comments: cuing for technique to maintain NWB through R wrist & weight bear through R elbow only, assistance to move RLE to EOB    Transfers Overall transfer level: Needs  assistance Equipment used: Right platform walker Transfers: Sit to/from Stand (from recliner) Sit to Stand: Mod assist         General transfer comment: cues for hand placement and technique  Ambulation/Gait Ambulation/Gait assistance: Min guard Gait Distance (Feet): 90 Feet Assistive device: Right platform walker Gait Pattern/deviations:  (3pt gait pattern, good pacing and energy conservation) Gait velocity: decreased   General Gait Details:  (Improved clearance of R foot with swing through)   Stairs             Wheelchair Mobility    Modified Rankin (Stroke Patients Only)       Balance                                            Cognition Arousal/Alertness: Awake/alert Behavior During Therapy: WFL for tasks assessed/performed Overall Cognitive Status: Within Functional Limits for tasks assessed                                 General Comments: Pleasant gentleman, hard worker      Exercises      General Comments General comments (skin integrity, edema, etc.): Pt educated on energy conservation techniques, pacing, and PLB technique with good teach back      Pertinent Vitals/Pain Pain Assessment: 0-10 Pain Score: 5  Pain Location: RLE Pain Descriptors / Indicators: Discomfort;Grimacing Pain Intervention(s):  Monitored during session    Home Living                      Prior Function            PT Goals (current goals can now be found in the care plan section) Acute Rehab PT Goals Patient Stated Goal: get better, go home Progress towards PT goals: Progressing toward goals    Frequency    BID      PT Plan Current plan remains appropriate    Co-evaluation              AM-PAC PT "6 Clicks" Mobility   Outcome Measure  Help needed turning from your back to your side while in a flat bed without using bedrails?: A Little Help needed moving from lying on your back to sitting on the side of a  flat bed without using bedrails?: A Lot Help needed moving to and from a bed to a chair (including a wheelchair)?: A Little Help needed standing up from a chair using your arms (e.g., wheelchair or bedside chair)?: A Lot Help needed to walk in hospital room?: A Lot Help needed climbing 3-5 steps with a railing? : Total 6 Click Score: 13    End of Session Equipment Utilized During Treatment: Gait belt;Oxygen Activity Tolerance: Patient tolerated treatment well;Other (comment) Patient left: in bed;with call bell/phone within reach;with family/visitor present;with SCD's reapplied Nurse Communication: Mobility status PT Visit Diagnosis: Unsteadiness on feet (R26.81);Muscle weakness (generalized) (M62.81);Difficulty in walking, not elsewhere classified (R26.2);Pain Pain - Right/Left: Right Pain - part of body: Leg;Hip     Time: 1405-1450 PT Time Calculation (min) (ACUTE ONLY): 45 min  Charges:  $Gait Training: 8-22 mins $Therapeutic Activity: 23-37 mins                    Mikel Cella, PTA    [name] 02/12/2021, 5:10 PM

## 2021-02-13 DIAGNOSIS — S72001A Fracture of unspecified part of neck of right femur, initial encounter for closed fracture: Secondary | ICD-10-CM | POA: Diagnosis not present

## 2021-02-13 LAB — BASIC METABOLIC PANEL
Anion gap: 6 (ref 5–15)
BUN: 9 mg/dL (ref 8–23)
CO2: 25 mmol/L (ref 22–32)
Calcium: 7.9 mg/dL — ABNORMAL LOW (ref 8.9–10.3)
Chloride: 94 mmol/L — ABNORMAL LOW (ref 98–111)
Creatinine, Ser: 0.52 mg/dL — ABNORMAL LOW (ref 0.61–1.24)
GFR, Estimated: >60 mL/min (ref >60)
Glucose, Bld: 96 mg/dL (ref 70–99)
Potassium: 4.2 mmol/L (ref 3.5–5.1)
Sodium: 125 mmol/L — ABNORMAL LOW (ref 135–145)

## 2021-02-13 LAB — CBC
HCT: 27.1 % — ABNORMAL LOW (ref 39.0–52.0)
Hemoglobin: 9.8 g/dL — ABNORMAL LOW (ref 13.0–17.0)
MCH: 34.5 pg — ABNORMAL HIGH (ref 26.0–34.0)
MCHC: 36.2 g/dL — ABNORMAL HIGH (ref 30.0–36.0)
MCV: 95.4 fL (ref 80.0–100.0)
Platelets: 133 10*3/uL — ABNORMAL LOW (ref 150–400)
RBC: 2.84 MIL/uL — ABNORMAL LOW (ref 4.22–5.81)
RDW: 12.8 % (ref 11.5–15.5)
WBC: 7.8 10*3/uL (ref 4.0–10.5)
nRBC: 0 % (ref 0.0–0.2)

## 2021-02-13 LAB — RESP PANEL BY RT-PCR (FLU A&B, COVID) ARPGX2
Influenza A by PCR: NEGATIVE
Influenza B by PCR: NEGATIVE
SARS Coronavirus 2 by RT PCR: NEGATIVE

## 2021-02-13 MED ORDER — FE FUMARATE-B12-VIT C-FA-IFC PO CAPS
1.0000 | ORAL_CAPSULE | Freq: Two times a day (BID) | ORAL | Status: DC
  Administered 2021-02-13 – 2021-02-14 (×2): 1 via ORAL
  Filled 2021-02-13 (×4): qty 1

## 2021-02-13 MED ORDER — LACTULOSE 10 GM/15ML PO SOLN
20.0000 g | Freq: Two times a day (BID) | ORAL | Status: DC
  Administered 2021-02-13 – 2021-02-14 (×3): 20 g via ORAL
  Filled 2021-02-13 (×4): qty 30

## 2021-02-13 MED ORDER — LACTULOSE 10 GM/15ML PO SOLN
20.0000 g | Freq: Two times a day (BID) | ORAL | Status: DC | PRN
  Filled 2021-02-13: qty 30

## 2021-02-13 MED ORDER — CYANOCOBALAMIN 1000 MCG/ML IJ SOLN
1000.0000 ug | INTRAMUSCULAR | Status: DC
  Administered 2021-02-13: 1000 ug via INTRAMUSCULAR
  Filled 2021-02-13: qty 1

## 2021-02-13 MED ORDER — POLYETHYLENE GLYCOL 3350 17 G PO PACK
17.0000 g | PACK | Freq: Every day | ORAL | Status: DC
  Administered 2021-02-13 – 2021-02-14 (×2): 17 g via ORAL
  Filled 2021-02-13: qty 1

## 2021-02-13 NOTE — TOC Progression Note (Signed)
Transition of Care [facility]) - Progression Note    Patient Details  Name: NYSHAUN STANDAGE MRN: [id number] Date of Birth: [birth year]  Transition of Care [facility]) CM/SW [place], RN Phone Number: 02/13/2021, 2:09 PM  Clinical Narrative:   Josem Kaufmann [id number]  SNF 02/13/2021   02/13/2021-02/17/2021 Approved Magda Paganini from [place] alerted.  Patient needing to have a BM prior to discharge. As per Magda Paganini, patient can come to [facility].    Expected Discharge Plan: [place]    Expected Discharge Plan and Services Expected Discharge Plan: [place]   Discharge Planning Services: CM Consult Post Acute Care Choice: Durable Medical Equipment, Home Health Living arrangements for the past 2 months: Single Family Home                 DME Arranged: Gilford Rile platform DME Agency: [employer] Date DME Agency Contacted: 02/10/21 Time DME Agency Contacted: 1 Representative spoke with at DME Agency: [place] Arranged: PT, OT           Social Determinants of Health ([place]) Interventions    Readmission Risk Interventions No flowsheet data found.

## 2021-02-13 NOTE — Progress Notes (Signed)
Physical Therapy Treatment Patient Details Name: Jordan Barber MRN: [id number] DOB: 1946/04/05 Today's Date: 02/13/2021   History of Present Illness Pt is a 75 y/o M admitted on 02/08/21. Pt fell while walking his dog & had pain in R wrist & hip. Pt underwent R intertrochanteric IM nail & R wrist ORIF of distal radial fx. Pt can weight bear through RUE elbow to use platform walker but is otherwise NWB through RUE forearm, wrist, & hand. RLE WBAT. PMH: prostate CA, tobacco abuse    PT Comments    Pt very fatigued after sitting up in recliner for 1 hour, required Mod/MaxA to raise from low chair due to weakness and pain. Increased time to turn and maneuver to edge of bed. Mod/MaxA to assist from sit to supine. Pt positioned to comfort, SCD placed, LE's elevated, R UE supported, and ice applied to lateral R hip. Pt unable to tolerate gait this pm, will see again in am.   Recommendations for follow up therapy are one component of a multi-disciplinary discharge planning process, led by the attending physician.  Recommendations may be updated based on patient status, additional functional criteria and insurance authorization.  Follow Up Recommendations  SNF     Equipment Recommendations  None recommended by PT    Recommendations for Other Services       Precautions / Restrictions Precautions Precautions: Fall Restrictions Weight Bearing Restrictions: Yes RUE Weight Bearing: Non weight bearing RLE Weight Bearing: Weight bearing as tolerated     Mobility  Bed Mobility Overal bed mobility: Needs Assistance Bed Mobility: Supine to Sit     Supine to sit: Max assist     General bed mobility comments: Increased assistance needed to lie down today. Pt very fatigued and pain has returned in R hip    Transfers Overall transfer level: Needs assistance Equipment used: Right platform walker Transfers: Sit to/from Stand Sit to Stand: Mod assist;Max assist         General transfer  comment:  (Increased struggle raising from low recliner chair)  Ambulation/Gait Ambulation/Gait assistance: Min guard Gait Distance (Feet): 5 Feet Assistive device: Right platform walker Gait Pattern/deviations: Step-to pattern;Antalgic Gait velocity: decreased   General Gait Details:  (Short distance gait this am, will increase in pm)   Stairs             Wheelchair Mobility    Modified Rankin (Stroke Patients Only)       Balance Overall balance assessment: Needs assistance Sitting-balance support: Feet supported;Single extremity supported Sitting balance-Leahy Scale: Good Sitting balance - Comments: supervision static sitting Postural control: Left lateral lean Standing balance support: During functional activity;Bilateral upper extremity supported Standing balance-Leahy Scale: Poor Standing balance comment: BUE support on R PFRW & SBA                            Cognition Arousal/Alertness: Lethargic;Suspect due to medications Behavior During Therapy: [facility] for tasks assessed/performed Overall Cognitive Status: Within Functional Limits for tasks assessed                                 General Comments: Pleasant gentleman, hard worker      Exercises General Exercises - Lower Extremity Ankle Circles/Pumps: AROM;Both;20 reps;Supine Quad Sets: AROM;Both;20 reps;Supine Gluteal Sets: AROM;Both;20 reps;Supine Heel Slides: AAROM;Strengthening;Right;10 reps;Supine Hip ABduction/ADduction: AAROM;Strengthening;Right;10 reps;Supine    General Comments General comments (skin integrity, edema, etc.):  Pt and wife educated on proper bed mobility and transfer technique, as well as gait sequencing.      Pertinent Vitals/Pain Pain Assessment: 0-10 Pain Score: 6  Pain Location: RLE Pain Descriptors / Indicators: Discomfort;Grimacing Pain Intervention(s): Limited activity within patient's tolerance;Monitored during session;Relaxation;Ice applied     Home Living                      Prior Function            PT Goals (current goals can now be found in the care plan section) Acute Rehab PT Goals Patient Stated Goal: get better, go home Progress towards PT goals: Progressing toward goals    Frequency    BID      PT Plan Current plan remains appropriate    Co-evaluation              AM-PAC PT "6 Clicks" Mobility   Outcome Measure  Help needed turning from your back to your side while in a flat bed without using bedrails?: A Little Help needed moving from lying on your back to sitting on the side of a flat bed without using bedrails?: A Lot Help needed moving to and from a bed to a chair (including a wheelchair)?: A Little Help needed standing up from a chair using your arms (e.g., wheelchair or bedside chair)?: A Lot Help needed to walk in hospital room?: A Lot Help needed climbing 3-5 steps with a railing? : Total 6 Click Score: 13    End of Session Equipment Utilized During Treatment: Gait belt;Oxygen Activity Tolerance: Patient limited by lethargy;Patient limited by pain Patient left: in bed;with call bell/phone within reach;with family/visitor present;with SCD's reapplied Nurse Communication: Mobility status;Patient requests pain meds PT Visit Diagnosis: Unsteadiness on feet (R26.81);Muscle weakness (generalized) (M62.81);Difficulty in walking, not elsewhere classified (R26.2);Pain Pain - Right/Left: Right Pain - part of body: Leg;Hip     Time: 0881-1031 PT Time Calculation (min) (ACUTE ONLY): 24 min  Charges:  $Gait Training: 8-22 mins $Therapeutic Exercise: 8-22 mins $Therapeutic Activity: 8-22 mins                    Mikel Cella, PTA    [name] 02/13/2021, 2:20 PM

## 2021-02-13 NOTE — Progress Notes (Addendum)
Subjective: 4 Days Post-Op Procedure(s) (LRB): INTRAMEDULLARY (IM) NAIL INTERTROCHANTRIC (Right) OPEN REDUCTION INTERNAL FIXATION (ORIF) DISTAL RADIAL FRACTURE (Right) Patient reports pain as improving compared to initially after surgery. Patient still on supplemental O2, continue to wean off. Plan is for discharge to SNF. Negative for chest pain at this time.  Denies any SOB except for when working with PT. Fever: no Gastrointestinal:Negative for nausea and vomiting Patient reports he is passing gas but has not had a BM yet.  Objective: Vital signs in last 24 hours: Temp:  [98 F (36.7 C)-98.8 F (37.1 C)] 98.8 F (37.1 C) (10/20 0729) Pulse Rate:  [74-87] 74 (10/20 0729) Resp:  [16-18] 18 (10/20 0530) BP: (108-125)/(58-72) 114/68 (10/20 0729) SpO2:  [95 %-100 %] 95 % (10/20 0729)  Intake/Output from previous day:  Intake/Output Summary (Last 24 hours) at 02/13/2021 0738 Last data filed at 02/13/2021 0533 Gross per 24 hour  Intake 120 ml  Output 850 ml  Net -730 ml    Intake/Output this shift: No intake/output data recorded.  Labs: Recent Labs    02/11/21 0453 02/13/21 0631  HGB 10.2* 9.8*   Recent Labs    02/11/21 0453 02/12/21 0300 02/13/21 0631  WBC 9.6  --  7.8  RBC 3.06* 2.87* 2.84*  HCT 29.7*  --  27.1*  PLT 121*  --  133*   Recent Labs    02/12/21 0300 02/13/21 0631  NA 125* 125*  K 4.2 4.2  CL 94* 94*  CO2 25 25  BUN 8 9  CREATININE 0.41* 0.52*  GLUCOSE 107* 96  CALCIUM 7.9* 7.9*   No results for input(s): LABPT, INR in the last 72 hours.   EXAM General - Patient is Alert, Appropriate, and Oriented Extremity - ABD soft Neurovascular intact Dorsiflexion/Plantar flexion intact Incision: dressing C/D/I No cellulitis present Compartment soft Short arm splint applied to the right arm.  Swelling improving. Able to flex and extend fingers with mild pain. Intact to light touch to the right hand. Dressing/Incision - No drainage noted to  either the right arm splint or right leg. Motor Function - intact, moving foot and toes well on exam.  Abdomen soft with normal bowel sounds.  Past Medical History:  Diagnosis Date   Fall    Hip fracture, right, closed, initial encounter ([place]) 02/08/2021   Prostate cancer ([place])    Tobacco abuse     Assessment/Plan: 4 Days Post-Op Procedure(s) (LRB): INTRAMEDULLARY (IM) NAIL INTERTROCHANTRIC (Right) OPEN REDUCTION INTERNAL FIXATION (ORIF) DISTAL RADIAL FRACTURE (Right) Principal Problem:   Hip fracture, right, closed, initial encounter [facility]) Active Problems:   Other fractures of lower end of right radius, initial encounter for closed fracture   Cigarette smoker   Hyponatremia   Aortic atherosclerosis (HCC)   Acute respiratory failure with hypoxia (HCC)   ABLA  Estimated body mass index is 21.17 kg/m as calculated from the following:   Height as of this encounter: 6' (1.829 m).   Weight as of this encounter: 70.8 kg. Advance diet Up with therapy D/C IV fluids when tolerating po intake.  Labs reviewed this AM, Hg 9.8 this AM. Up with therapy today.  Current plan is for discharge to SNF. Patient is passing gas but has not had a BM yet.  Lactulose added, proceed with enema today. Will need to remain non-weightbearing to the right arm.  Walker with arm attachment will be needed. Patient requested that I change his diet to regular from heart health.  Changed diet in the  system for him.  Up on discharge, patient will need to follow-up with [facility] orthopaedics in 10-14 days. Continue Lovenox 40mg  daily for DVT prophylaxis.  DVT Prophylaxis - Lovenox, Foot Pumps, and TED hose Weight-Bearing as tolerated to right leg Non-weightbearing to the right arm.  Raquel Mintie Witherington, PA-C [facility] 02/13/2021, 7:38 AM

## 2021-02-13 NOTE — Progress Notes (Signed)
PROGRESS NOTE    Jordan Barber  TDV:[id number] DOB: 08/13/1945 DOA: 02/08/2021 PCP: Albina Billet, MD   Brief Narrative: Taken from prior notes. 75 year old man in relatively good health, presented after mechanical fall resulting in right wrist and hip pain. --10/15 admitted for right hip and wrist fracture. Taken to the OR for ORIF on 02/09/2021.  Patient developed acute hypoxic respiratory failure requiring 2 to 3 L of oxygen postoperatively, has an history of emphysema but he was not on any oxygen at baseline.  Now stable but do desaturate while working with PT. He will be discharged to SNF on oxygen where they can try to wean him off if possible.  10/19: Wife and PT were concern about coughing with thin liquids, going on for a while at home.  Swallow evaluation was obtained but he is low risk for aspiration and they put some recommendations to follow while feeding to avoid the risk of aspiration.  They are also recommending GI follow-up for any esophageal dysmotility which can be done as an outpatient.  10/20: Most likely can go to SNF tomorrow, COVID test ordered.  Patient will be nonweightbearing on right arm and will need a walker with arm attachment per orthopedic surgery.  Subjective: Patient seems improving.  Still no bowel movement, he does not want any enema.  Agreed to add lactulose with MiraLAX.  Assessment & Plan:   Principal Problem:   Hip fracture, right, closed, initial encounter [facility]) Active Problems:   Other fractures of lower end of right radius, initial encounter for closed fracture   Cigarette smoker   Hyponatremia   Aortic atherosclerosis (HCC)   Acute respiratory failure with hypoxia (HCC)   ABLA  Right hip fracture: Secondary to mechanical fall, s/p ORIF. Postoperative course complicated with acute blood loss anemia and acute hypoxic respiratory failure requiring oxygen.  Currently stable. PT is recommending SNF-insurance authorization came back and facility  should be able to take him tomorrow, COVID test ordered. -Continue with PT -Continue with pain management  Right lower radius fracture.  Secondary to the same fall which resulted in right hip fracture.  Being managed by orthopedic. -Continue with pain management  Concern of aspiration.  Because of wife's concern of coughing with thin liquid we obtain swallow evaluation which shows low risk for aspiration and they did not put their recommendations in order to decrease her risk.  They were also recommending GI evaluation for esophageal dysmotility which can be done as an outpatient.  Acute blood loss anemia.  Likely postoperatively and fall. -Anemia panel with vitamin B12 deficiency, B12 at 145, anemia of chronic disease with mild iron deficiency. -Start him on supplements. -He will need repeat levels in 4 to 6 weeks by PCP. -Monitor hemoglobin -Transfuse if below 7  Acute hypoxic respiratory failure.  Developed postoperatively with history of underlying emphysema.  Not on any home oxygen.  Currently stable on 2 L of oxygen while resting. Per PT patient do desaturate with ambulation while working with them. -Continue with supplemental oxygen to keep the saturation above 90%-most likely will be discharged to SNF with oxygen and can be weaned off over there.  Aortic atherosclerosis. -Continue Lipitor  Objective: Vitals:   02/12/21 2337 02/13/21 0530 02/13/21 0729 02/13/21 1124  BP: 114/71 119/61 114/68 124/76  Pulse: 78 75 74 81  Resp: 18 18  18   Temp: 98.2 F (36.8 C) 98 F (36.7 C) 98.8 F (37.1 C) 97.9 F (36.6 C)  TempSrc:  Oral  SpO2: 96% 96% 95% 92%  Weight:      Height:        Intake/Output Summary (Last 24 hours) at 02/13/2021 1517 Last data filed at 02/13/2021 1420 Gross per 24 hour  Intake 600 ml  Output 1450 ml  Net -850 ml    Filed Weights   02/08/21 2105 02/09/21 0034  Weight: 68.9 kg 70.8 kg    Examination:  General.  Frail elderly man, in no acute  distress. Pulmonary.  Lungs clear bilaterally, normal respiratory effort. CV.  Regular rate and rhythm, no JVD, rub or murmur. Abdomen.  Soft, nontender, nondistended, BS positive. CNS.  Alert and oriented .  No focal neurologic deficit. Extremities.  No edema, no cyanosis, pulses intact and symmetrical. Psychiatry.  Judgment and insight appears normal.   DVT prophylaxis: Lovenox Code Status: Full Family Communication: Discussed with wife at bedside Disposition Plan:  Status is: Inpatient  Remains inpatient appropriate because: Currently stable for discharge, awaiting SNF placement.  Level of care: Med-Surg  All the records are reviewed and case discussed with Care Management/Social Worker. Management plans discussed with the patient, nursing and they are in agreement.  Consultants:  Orthopedic surgery  Procedures:  Antimicrobials:   Data Reviewed: I have personally reviewed following labs and imaging studies  CBC: Recent Labs  Lab 02/08/21 2103 02/09/21 0413 02/10/21 0405 02/11/21 0453 02/13/21 0631  WBC 9.8 12.6* 9.5 9.6 7.8  NEUTROABS 5.3  --   --   --   --   HGB 15.0 14.3 11.9* 10.2* 9.8*  HCT 41.5 42.8 34.3* 29.7* 27.1*  MCV 96.1 99.1 99.4 97.1 95.4  PLT 207 179 124* 121* 133*    Basic Metabolic Panel: Recent Labs  Lab 02/09/21 0413 02/10/21 0405 02/11/21 0453 02/12/21 0300 02/13/21 0631  NA 128* 131* 127* 125* 125*  K 3.9 4.1 4.0 4.2 4.2  CL 98 99 97* 94* 94*  CO2 24 25 25 25 25   GLUCOSE 107* 97 99 107* 96  BUN 12 12 11 8 9   CREATININE 0.63 0.68 0.55* 0.41* 0.52*  CALCIUM 8.8* 8.1* 7.8* 7.9* 7.9*    GFR: Estimated Creatinine Clearance: 82.4 mL/min (A) (by C-G formula based on SCr of 0.52 mg/dL (L)). Liver Function Tests: No results for input(s): AST, ALT, ALKPHOS, BILITOT, PROT, ALBUMIN in the last 168 hours. No results for input(s): LIPASE, AMYLASE in the last 168 hours. No results for input(s): AMMONIA in the last 168 hours. Coagulation  Profile: No results for input(s): INR, PROTIME in the last 168 hours. Cardiac Enzymes: No results for input(s): CKTOTAL, CKMB, CKMBINDEX, TROPONINI in the last 168 hours. BNP (last 3 results) No results for input(s): PROBNP in the last 8760 hours. HbA1C: No results for input(s): HGBA1C in the last 72 hours. CBG: No results for input(s): GLUCAP in the last 168 hours. Lipid Profile: No results for input(s): CHOL, HDL, LDLCALC, TRIG, CHOLHDL, LDLDIRECT in the last 72 hours. Thyroid Function Tests: No results for input(s): TSH, T4TOTAL, FREET4, T3FREE, THYROIDAB in the last 72 hours. Anemia Panel: Recent Labs    02/12/21 0300 02/12/21 1607  VITAMINB12  --  145*  FOLATE 7.5  --   FERRITIN 425*  --   TIBC 189*  --   IRON 19*  --   RETICCTPCT 2.1  --    Sepsis Labs: No results for input(s): PROCALCITON, LATICACIDVEN in the last 168 hours.  Recent Results (from the past 240 hour(s))  Resp Panel by RT-PCR (Flu A&B,  Covid) Nasopharyngeal Swab     Status: None   Collection Time: 02/08/21 10:09 PM   Specimen: Nasopharyngeal Swab; Nasopharyngeal(NP) swabs in vial transport medium  Result Value Ref [place] Status   SARS Coronavirus 2 by RT PCR NEGATIVE NEGATIVE Final    Comment: (NOTE) SARS-CoV-2 target nucleic acids are NOT DETECTED.  The SARS-CoV-2 RNA is generally detectable in upper respiratory specimens during the acute phase of infection. The lowest concentration of SARS-CoV-2 viral copies this assay can detect is 138 copies/mL. A negative result does not preclude SARS-Cov-2 infection and should not be used as the sole basis for treatment or other patient management decisions. A negative result may occur with  improper specimen collection/handling, submission of specimen other than nasopharyngeal swab, presence of viral mutation(s) within the areas targeted by this assay, and inadequate number of viral copies(<138 copies/mL). A negative result must be combined with clinical  observations, patient history, and epidemiological information. The expected result is Negative.  Fact Sheet for Patients:  EntrepreneurPulse.com.au  Fact Sheet for Healthcare Providers:  IncredibleEmployment.be  This test is no t yet approved or cleared by the [place] FDA and  has been authorized for detection and/or diagnosis of SARS-CoV-2 by FDA under an Emergency Use Authorization (EUA). This EUA will remain  in effect (meaning this test can be used) for the duration of the COVID-19 declaration under Section 564(b)(1) of the Act, 21 U.S.C.section 360bbb-3(b)(1), unless the authorization is terminated  or revoked sooner.       Influenza A by PCR NEGATIVE NEGATIVE Final   Influenza B by PCR NEGATIVE NEGATIVE Final    Comment: (NOTE) The Xpert Xpress SARS-CoV-2/FLU/RSV plus assay is intended as an aid in the diagnosis of influenza from Nasopharyngeal swab specimens and should not be used as a sole basis for treatment. Nasal washings and aspirates are unacceptable for Xpert Xpress SARS-CoV-2/FLU/RSV testing.  Fact Sheet for Patients: EntrepreneurPulse.com.au  Fact Sheet for Healthcare Providers: IncredibleEmployment.be  This test is not yet approved or cleared by the [place] FDA and has been authorized for detection and/or diagnosis of SARS-CoV-2 by FDA under an Emergency Use Authorization (EUA). This EUA will remain in effect (meaning this test can be used) for the duration of the COVID-19 declaration under Section 564(b)(1) of the Act, 21 U.S.C. section 360bbb-3(b)(1), unless the authorization is terminated or revoked.  Performed at [facility], [address] [place], [place] [address]   Surgical PCR screen     Status: None   Collection Time: 02/09/21  1:04 AM   Specimen: Nasal Mucosa; Nasal Swab  Result Value Ref [place] Status   MRSA, PCR NEGATIVE NEGATIVE Final    Staphylococcus aureus NEGATIVE NEGATIVE Final    Comment: (NOTE) The Xpert SA Assay (FDA approved for NASAL specimens in patients 24 years of age and older), is one component of a comprehensive surveillance program. It is not intended to diagnose infection nor to guide or monitor treatment. Performed at [facility], [address], [place], [place] [address]       Radiology Studies: No results found.  Scheduled Meds:  docusate sodium  100 mg Oral BID   enoxaparin (LOVENOX) injection  40 mg Subcutaneous Q24H   fluticasone  2 spray Each Nare Daily   hydrALAZINE  10 mg Intravenous Q6H   lactulose  20 g Oral BID   loratadine  10 mg Oral Daily   polyethylene glycol  17 g Oral Daily   sodium chloride  1 g Oral  BID WC   Continuous Infusions:   LOS: 5 days   Time spent: 35 minutes More than 50% of the time was spent in counseling/coordination of care  Lorella Nimrod, MD [facility]  If 7PM-7AM, please contact night-coverage [email or web address]  02/13/2021, 3:17 PM   This record has been created using Systems analyst. Errors have been sought and corrected,but may not always be located. Such creation errors do not reflect on the standard of care.

## 2021-02-13 NOTE — Progress Notes (Signed)
Physical Therapy Treatment Patient Details Name: Jordan Barber MRN: [id number] DOB: Dec 31, 1945 Today's Date: 02/13/2021   History of Present Illness Pt is a 75 y/o M admitted on 02/08/21. Pt fell while walking his dog & had pain in R wrist & hip. Pt underwent R intertrochanteric IM nail & R wrist ORIF of distal radial fx. Pt can weight bear through RUE elbow to use platform walker but is otherwise NWB through RUE forearm, wrist, & hand. RLE WBAT. PMH: prostate CA, tobacco abuse    PT Comments    Therapist in this am, attempted isometric exercises for R LE in bed, pt unable to tolerate due to 7/10 pain. Nursing notified for dispense of pain meds. Therapist returned 45 min. Later and pt able to tolerate desired exercises B LE's with no c/o pain at R hip with assisted heel slides. Pt sat EOB and was assisted to [facility] with R platform walker. No BM, pt assisted to recliner.  Will return in pm for progression of gait distance. Pt is making functional progress daily. Increased tolerance for PT when pre-medicated. D/C to SNF once medically cleared.   Recommendations for follow up therapy are one component of a multi-disciplinary discharge planning process, led by the attending physician.  Recommendations may be updated based on patient status, additional functional criteria and insurance authorization.  Follow Up Recommendations  SNF     Equipment Recommendations  None recommended by PT    Recommendations for Other Services       Precautions / Restrictions Precautions Precautions: Fall Restrictions Weight Bearing Restrictions: Yes RUE Weight Bearing: Non weight bearing RLE Weight Bearing: Weight bearing as tolerated     Mobility  Bed Mobility Overal bed mobility: Needs Assistance Bed Mobility: Sit to Supine     Supine to sit: Mod assist;HOB elevated     General bed mobility comments: cuing for technique to maintain NWB through R wrist & weight bear through R elbow only, assistance to  move RLE to EOB    Transfers Overall transfer level: Needs assistance Equipment used: Right platform walker Transfers: Sit to/from Stand Sit to Stand: Mod assist         General transfer comment:  (cues for hand placement. Pt required MinA from raised bed, ModA from lower BSC)  Ambulation/Gait Ambulation/Gait assistance: Min guard Gait Distance (Feet): 5 Feet Assistive device: Right platform walker Gait Pattern/deviations: Step-to pattern;Antalgic Gait velocity: decreased   General Gait Details:  (Short distance gait this am, will increase in pm)   Stairs             Wheelchair Mobility    Modified Rankin (Stroke Patients Only)       Balance Overall balance assessment: Needs assistance Sitting-balance support: Feet supported;Single extremity supported Sitting balance-Leahy Scale: Good Sitting balance - Comments: supervision static sitting Postural control: Left lateral lean Standing balance support: During functional activity;Bilateral upper extremity supported Standing balance-Leahy Scale: Poor Standing balance comment: BUE support on R PFRW & SBA                            Cognition Arousal/Alertness: Awake/alert Behavior During Therapy: WFL for tasks assessed/performed Overall Cognitive Status: Within Functional Limits for tasks assessed                                 General Comments: Pleasant gentleman, hard worker  Exercises General Exercises - Lower Extremity Ankle Circles/Pumps: AROM;Both;20 reps;Supine Quad Sets: AROM;Both;20 reps;Supine Gluteal Sets: AROM;Both;20 reps;Supine Heel Slides: AAROM;Strengthening;Right;10 reps;Supine Hip ABduction/ADduction: AAROM;Strengthening;Right;10 reps;Supine    General Comments General comments (skin integrity, edema, etc.): Pt and wife educated on proper bed mobility and transfer technique, as well as gait sequencing.      Pertinent Vitals/Pain Pain Assessment:  0-10 Pain Score: 6  Pain Location: RLE Pain Descriptors / Indicators: Discomfort;Grimacing Pain Intervention(s): Patient requesting pain meds-RN notified (Therapist returned after pain meds began to take affect)    Home Living                      Prior Function            PT Goals (current goals can now be found in the care plan section) Acute Rehab PT Goals Patient Stated Goal: get better, go home Progress towards PT goals: Progressing toward goals    Frequency    BID      PT Plan Current plan remains appropriate    Co-evaluation              AM-PAC PT "6 Clicks" Mobility   Outcome Measure  Help needed turning from your back to your side while in a flat bed without using bedrails?: A Little Help needed moving from lying on your back to sitting on the side of a flat bed without using bedrails?: A Lot Help needed moving to and from a bed to a chair (including a wheelchair)?: A Little Help needed standing up from a chair using your arms (e.g., wheelchair or bedside chair)?: A Lot Help needed to walk in hospital room?: A Lot Help needed climbing 3-5 steps with a railing? : Total 6 Click Score: 13    End of Session Equipment Utilized During Treatment: Gait belt;Oxygen Activity Tolerance: Patient tolerated treatment well;Other (comment) Patient left: in chair;with call bell/phone within reach;with chair alarm set;with family/visitor present Nurse Communication: Mobility status;Patient requests pain meds PT Visit Diagnosis: Unsteadiness on feet (R26.81);Muscle weakness (generalized) (M62.81);Difficulty in walking, not elsewhere classified (R26.2);Pain Pain - Right/Left: Right Pain - part of body: Leg;Hip     Time: 8115-7262 PT Time Calculation (min) (ACUTE ONLY): 45 min  Charges:  $Gait Training: 8-22 mins $Therapeutic Exercise: 8-22 mins $Therapeutic Activity: 8-22 mins                    Mikel Cella, PTA    Jordan Barber 02/13/2021, 1:36  PM

## 2021-02-14 DIAGNOSIS — L209 Atopic dermatitis, unspecified: Secondary | ICD-10-CM | POA: Insufficient documentation

## 2021-02-14 DIAGNOSIS — S72001A Fracture of unspecified part of neck of right femur, initial encounter for closed fracture: Secondary | ICD-10-CM | POA: Diagnosis not present

## 2021-02-14 MED ORDER — POLYETHYLENE GLYCOL 3350 17 G PO PACK: 17.0000 g | PACK | Freq: Every day | ORAL | 0 refills | Status: DC

## 2021-02-14 MED ORDER — CYANOCOBALAMIN 1000 MCG/ML IJ SOLN: 1000.0000 ug | INTRAMUSCULAR | 0 refills | Status: DC

## 2021-02-14 MED ORDER — SALINE SPRAY 0.65 % NA SOLN
1.0000 | NASAL | 0 refills | Status: DC | PRN
Start: 2021-02-14 — End: 2021-04-19

## 2021-02-14 MED ORDER — DOCUSATE SODIUM 100 MG PO CAPS: 100.0000 mg | ORAL_CAPSULE | Freq: Two times a day (BID) | ORAL | 0 refills | Status: DC

## 2021-02-14 MED ORDER — ACETAMINOPHEN 325 MG PO TABS: 325.0000 mg | ORAL_TABLET | Freq: Four times a day (QID) | ORAL | Status: DC | PRN

## 2021-02-14 MED ORDER — GUAIFENESIN-DM 100-10 MG/5ML PO SYRP: 5.0000 mL | ORAL_SOLUTION | ORAL | 0 refills | Status: DC | PRN

## 2021-02-14 MED ORDER — FE FUMARATE-B12-VIT C-FA-IFC PO CAPS: 1.0000 | ORAL_CAPSULE | Freq: Two times a day (BID) | ORAL | Status: DC

## 2021-02-14 MED ORDER — LACTULOSE 10 GM/15ML PO SOLN: 20.0000 g | Freq: Two times a day (BID) | ORAL | 0 refills | Status: DC

## 2021-02-14 MED ORDER — LORATADINE 10 MG PO TABS: 10.0000 mg | ORAL_TABLET | Freq: Every day | ORAL | Status: DC

## 2021-02-14 MED ORDER — FLUTICASONE PROPIONATE 50 MCG/ACT NA SUSP: 2.0000 | Freq: Every day | NASAL | 2 refills | Status: AC

## 2021-02-14 MED ORDER — ATORVASTATIN CALCIUM 20 MG PO TABS
40.0000 mg | ORAL_TABLET | Freq: Every day | ORAL | Status: DC
  Administered 2021-02-14: 40 mg via ORAL

## 2021-02-14 MED ORDER — ATORVASTATIN CALCIUM 40 MG PO TABS: 40.0000 mg | ORAL_TABLET | Freq: Every day | ORAL | Status: DC

## 2021-02-14 NOTE — Progress Notes (Signed)
Subjective: 5 Days Post-Op Procedure(s) (LRB): INTRAMEDULLARY (IM) NAIL INTERTROCHANTRIC (Right) OPEN REDUCTION INTERNAL FIXATION (ORIF) DISTAL RADIAL FRACTURE (Right) Patient reports pain as improving compared to initially after surgery. Patient still on supplemental O2, continue to wean off.  Will likely need to be d/c with O2. Plan is for discharge to SNF. Negative for chest pain at this time.  Denies any SOB except for when working with PT. Fever: no Gastrointestinal:Negative for nausea and vomiting Patient reports he is passing gas but has not had a BM yet.  Objective: Vital signs in last 24 hours: Temp:  [97.9 F (36.6 C)-98.8 F (37.1 C)] 98.1 F (36.7 C) (10/21 0532) Pulse Rate:  [72-83] 72 (10/21 0532) Resp:  [18-20] 18 (10/21 0532) BP: (112-137)/(66-84) 112/73 (10/21 0532) SpO2:  [92 %-97 %] 97 % (10/21 0532)  Intake/Output from previous day:  Intake/Output Summary (Last 24 hours) at 02/14/2021 0716 Last data filed at 02/14/2021 0537 Gross per 24 hour  Intake 480 ml  Output 3350 ml  Net -2870 ml    Intake/Output this shift: No intake/output data recorded.  Labs: Recent Labs    02/13/21 0631  HGB 9.8*   Recent Labs    02/12/21 0300 02/13/21 0631  WBC  --  7.8  RBC 2.87* 2.84*  HCT  --  27.1*  PLT  --  133*   Recent Labs    02/12/21 0300 02/13/21 0631  NA 125* 125*  K 4.2 4.2  CL 94* 94*  CO2 25 25  BUN 8 9  CREATININE 0.41* 0.52*  GLUCOSE 107* 96  CALCIUM 7.9* 7.9*   No results for input(s): LABPT, INR in the last 72 hours.   EXAM General - Patient is Alert, Appropriate, and Oriented Extremity - ABD soft Neurovascular intact Dorsiflexion/Plantar flexion intact Incision: dressing C/D/I No cellulitis present Compartment soft Short arm splint applied to the right arm.  Swelling improving. Able to flex and extend fingers with mild pain. Intact to light touch to the right hand.  Bruising noted to the right arm. Dressing/Incision - No  drainage noted to either the right arm splint or right leg. Motor Function - intact, moving foot and toes well on exam.  Abdomen soft with normal bowel sounds.  Past Medical History:  Diagnosis Date   Fall    Hip fracture, right, closed, initial encounter ([place]) 02/08/2021   Prostate cancer ([place])    Tobacco abuse     Assessment/Plan: 5 Days Post-Op Procedure(s) (LRB): INTRAMEDULLARY (IM) NAIL INTERTROCHANTRIC (Right) OPEN REDUCTION INTERNAL FIXATION (ORIF) DISTAL RADIAL FRACTURE (Right) Principal Problem:   Hip fracture, right, closed, initial encounter [facility]) Active Problems:   Other fractures of lower end of right radius, initial encounter for closed fracture   Cigarette smoker   Hyponatremia   Aortic atherosclerosis (HCC)   Acute respiratory failure with hypoxia (HCC)   ABLA  Estimated body mass index is 21.17 kg/m as calculated from the following:   Height as of this encounter: 6' (1.829 m).   Weight as of this encounter: 70.8 kg. Advance diet Up with therapy D/C IV fluids when tolerating po intake.  Vitals stable this AM. Up with therapy today.  Current plan is for discharge to SNF. Patient is passing gas but has not had a BM yet.  Lactulose added, proceed with enema today. Will need to remain non-weightbearing to the right arm.  Walker with arm attachment will be needed.  Up on discharge, patient will need to follow-up with [facility] orthopaedics in 10-14  days. Continue Lovenox 40mg  daily for DVT prophylaxis for 14 days after discharge.  DVT Prophylaxis - Lovenox, Foot Pumps, and TED hose Weight-Bearing as tolerated to right leg Non-weightbearing to the right arm.  Raquel Caid Radin, PA-C [facility] 02/14/2021, 7:16 AM

## 2021-02-14 NOTE — Discharge Summary (Signed)
Physician Discharge Summary  Taequan Stockhausen Rushing IHK:[id number] DOB: 02-Jan-1946 DOA: 02/08/2021  PCP: Albina Billet, MD  Admit date: 02/08/2021 Discharge date: 02/14/2021  Admitted From: Home Disposition: SNF  Recommendations for Outpatient Follow-up:  Follow up with PCP in 1-2 weeks Follow-up with orthopedic surgery Please obtain BMP/CBC in one week Please follow up on the following pending results: None  Home Health: No Equipment/Devices: Home oxygen Discharge Condition: Stable CODE STATUS: Full Diet recommendation: Heart Healthy   Brief/Interim Summary: 75 year old man in relatively good health, presented after mechanical fall resulting in right wrist and hip pain. --10/15 admitted for right hip and wrist fracture. Taken to the OR for ORIF on 02/09/2021.  Patient developed acute hypoxic respiratory failure requiring 2 to 3 L of oxygen postoperatively, has an history of emphysema but he was not on any oxygen at baseline.  Now stable but do desaturate while working with PT. he is being discharged to SNF with oxygen, please try to wean him off if possible.  Patient also suffers right lower radius fracture secondary to fall.  Being managed by orthopedic and according to their recommendations he will remain nonweightbearing on right upper extremity.  Patient will need a walker with arm attachment according to orthopedic surgery recommendations  There was some concern from wife that he coughs with thin liquids so swallow evaluation was obtained which shows low risk for aspiration and deferred some recommendations to follow while feeding to avoid the risk of aspiration.  Patient should be upright while eating.  Swallow team also recommend GI follow-up to rule out any esophageal dysmotility that can be done as an outpatient.  Patient also developed acute blood loss anemia postoperatively.  Anemia panel shows B12 deficiency, B12 at 145, anemia of chronic disease with mild iron deficiency.  He was  started on supplement by mouth and also given B12 weekly injections.  His PCP should be able to follow-up and continue if needed.  Patient is prone to constipation especially while taking pain medications.  He was given a good bowel regimen and rehab should be able to follow-up and titrate accordingly.  Please backup if he develops diarrhea or liberalize if remains constipated.  Currently having bowel movements with this regimen.  Will continue the rest of his home medications and follow-up with his providers.  Discharge Diagnoses:  Principal Problem:   Hip fracture, right, closed, initial encounter [facility]) Active Problems:   Other fractures of lower end of right radius, initial encounter for closed fracture   Cigarette smoker   Hyponatremia   Aortic atherosclerosis ([place])   Acute respiratory failure with hypoxia ([place])   ABLA   Discharge Instructions  Discharge Instructions     Diet - low sodium heart healthy   Complete by: As directed    Discharge wound care:   Complete by: As directed    Reinforce dressing as needed. Nonweightbearing on right upper extremity. Replace Ace wrap as needed   Increase activity slowly   Complete by: As directed       Allergies as of 02/14/2021   No Known Allergies      Medication List     TAKE these medications    acetaminophen 325 MG tablet Commonly known as: TYLENOL Take 1-2 tablets (325-650 mg total) by mouth every 6 (six) hours as needed for mild pain (pain score 1-3 or temp > 100.5).   amLODipine 2.5 MG tablet Commonly known as: NORVASC Take 2.5 mg by mouth daily.   atorvastatin 40 MG tablet  Commonly known as: LIPITOR Take 1 tablet (40 mg total) by mouth daily. Start taking on: February 15, 2021 What changed:  medication strength how much to take when to take this   clobetasol ointment 0.05 % Commonly known as: TEMOVATE Apply 1 application topically 2 (two) times daily.   cyanocobalamin 1000 MCG/ML injection Commonly  known as: (VITAMIN B-12) Inject 1 mL (1,000 mcg total) into the muscle every 7 (seven) days. Start taking on: February 20, 2021   docusate sodium 100 MG capsule Commonly known as: COLACE Take 1 capsule (100 mg total) by mouth 2 (two) times daily.   enoxaparin 40 MG/0.4ML injection Commonly known as: LOVENOX Inject 0.4 mLs (40 mg total) into the skin daily.   ferrous DXAJOINO-M76-HMCNOBS C-folic acid capsule Commonly known as: TRINSICON / FOLTRIN Take 1 capsule by mouth 2 (two) times daily after a meal.   fluticasone 50 MCG/ACT nasal spray Commonly known as: FLONASE Place 2 sprays into both nostrils daily.   guaiFENesin-dextromethorphan 100-10 MG/5ML syrup Commonly known as: ROBITUSSIN DM Take 5 mLs by mouth every 4 (four) hours as needed for cough.   HYDROcodone-acetaminophen 5-325 MG tablet Commonly known as: NORCO/VICODIN Take 1-2 tablets by mouth every 4 (four) hours as needed for moderate pain.   lactulose 10 GM/15ML solution Commonly known as: CHRONULAC Take 30 mLs (20 g total) by mouth 2 (two) times daily.   loratadine 10 MG tablet Commonly known as: CLARITIN Take 1 tablet (10 mg total) by mouth daily.   methocarbamol 750 MG tablet Commonly known as: ROBAXIN Take 1 tablet (750 mg total) by mouth every 8 (eight) hours as needed for muscle spasms.   ondansetron 4 MG tablet Commonly known as: ZOFRAN Take 1 tablet (4 mg total) by mouth every 6 (six) hours as needed for nausea.   polyethylene glycol 17 g packet Commonly known as: MIRALAX / GLYCOLAX Take 17 g by mouth daily.   sodium chloride 0.65 % Soln nasal spray Commonly known as: OCEAN Place 1 spray into both nostrils as needed for congestion.               Durable Medical Equipment  (From admission, onward)           Start     Ordered   02/11/21 0757  For home use only DME oxygen  Once       Question Answer Comment  Length of Need 6 Months   Mode or (Route) Nasal cannula   Liters per Minute  3   Frequency Continuous (stationary and portable oxygen unit needed)   Oxygen delivery system Gas      02/11/21 0756   02/10/21 1454  For home use only DME oxygen  Once       Question Answer Comment  Length of Need 6 Months   Mode or (Route) Nasal cannula   Liters per Minute 4   Frequency Continuous (stationary and portable oxygen unit needed)   Oxygen delivery system Gas      02/10/21 1453              Discharge Care Instructions  (From admission, onward)           Start     Ordered   02/14/21 0000  Discharge wound care:       Comments: Reinforce dressing as needed. Nonweightbearing on right upper extremity. Replace Ace wrap as needed   02/14/21 1001            Contact information for  follow-up providers     Lattie Corns, PA-C Follow up in 14 day(s).   Specialty: Physician Assistant Why: Staple and suture removal and repeat x-rays. Contact information: [place] [address] [phone]         Albina Billet, MD Follow up in 1 week(s).   Specialty: Internal Medicine Contact information: [address]   [place] [address] [phone]              Contact information for after-discharge care     [facility] .   Service: Skilled Nursing Contact information: [place] [phone]                    No Known Allergies  Consultations: Orthopedic surgery  Procedures/Studies: DG Wrist Complete Right  Result Date: 02/08/2021 CLINICAL DATA:  Fall EXAM: RIGHT WRIST - COMPLETE 3+ VIEW COMPARISON:  None. FINDINGS: Mildly anteriorly displaced fracture of the distal right radius, extra-articular. Moderate soft tissue swelling. No ulna fracture. IMPRESSION: Mildly anteriorly displaced extra-articular fracture of the distal  right radius. Electronically Signed   By: Ulyses Jarred M.D.   On: 02/08/2021 21:51   DG Chest Portable 1 View  Result Date: 02/08/2021 CLINICAL DATA:  Fall EXAM: PORTABLE CHEST 1 VIEW COMPARISON:  06/09/2011 FINDINGS: The heart size and mediastinal contours are within normal limits. Both lungs are clear. The visualized skeletal structures are unremarkable. IMPRESSION: No active disease. Electronically Signed   By: Ulyses Jarred M.D.   On: 02/08/2021 22:07   DG C-Arm 1-60 Min  Result Date: 02/09/2021 CLINICAL DATA:  Right femoral nail EXAM: DG C-ARM 1-60 MIN; RIGHT FEMUR 2 VIEWS FLUOROSCOPY TIME:  Fluoroscopy Time:  41 seconds Radiation Exposure Index (if provided by the fluoroscopic device): 7.3 mGy COMPARISON:  None. FINDINGS: Intramedullary rod and nail fixation of the right femur spanning inter trochanteric fracture. Single distal interlocking screw. IMPRESSION: Fluoroscopic guidance for right femur fracture fixation. Electronically Signed   By: Macy Mis M.D.   On: 02/09/2021 17:31   DG MINI C-ARM IMAGE ONLY  Result Date: 02/09/2021 There is no interpretation for this exam.  This order is for images obtained during a surgical procedure.  Please See "Surgeries" Tab for more information regarding the procedure.   DG Hip Unilat W or Wo Pelvis 2-3 Views Right  Result Date: 02/08/2021 CLINICAL DATA:  Fall EXAM: DG HIP (WITH OR WITHOUT PELVIS) 2-3V RIGHT COMPARISON:  None. FINDINGS: Minimally displaced intertrochanteric fracture of the proximal right femur. No other pelvic fracture. IMPRESSION: Minimally displaced intertrochanteric fracture of the proximal right femur. Electronically Signed   By: Ulyses Jarred M.D.   On: 02/08/2021 21:52   DG FEMUR, MIN 2 VIEWS RIGHT  Result Date: 02/09/2021 CLINICAL DATA:  Right femoral nail EXAM: DG C-ARM 1-60 MIN; RIGHT FEMUR 2 VIEWS FLUOROSCOPY TIME:  Fluoroscopy Time:  41 seconds Radiation Exposure Index (if provided by the fluoroscopic device): 7.3  mGy COMPARISON:  None. FINDINGS: Intramedullary rod and nail fixation of the right femur spanning inter trochanteric fracture. Single distal interlocking screw. IMPRESSION: Fluoroscopic guidance for right femur fracture fixation. Electronically Signed   By: Macy Mis M.D.   On: 02/09/2021 17:31    Subjective: Patient was seen and examined today.  Feeling much improved.  Wife at bedside.  Ready to go to rehab.  Discharge Exam: Vitals:   02/14/21 0532 02/14/21 0742  BP: 112/73 121/66  Pulse: 72 81  Resp: 18 18  Temp: 98.1 F (36.7 C) 97.9 F (36.6 C)  SpO2: 97% 95%   Vitals:   02/13/21 2043 02/13/21 2353 02/14/21 0532 02/14/21 0742  BP: 126/66 137/84 112/73 121/66  Pulse: 83 79 72 81  Resp: 20 20 18 18   Temp: 98.3 F (36.8 C) 98.8 F (37.1 C) 98.1 F (36.7 C) 97.9 F (36.6 C)  TempSrc:    Oral  SpO2: 96% 95% 97% 95%  Weight:      Height:        General: Pt is alert, awake, not in acute distress Cardiovascular: RRR, S1/S2 +, no rubs, no gallops Respiratory: CTA bilaterally, no wheezing, no rhonchi Abdominal: Soft, NT, ND, bowel sounds + Extremities: no edema, no cyanosis   The results of significant diagnostics from this hospitalization (including imaging, microbiology, ancillary and laboratory) are listed below for reference.    Microbiology: Recent Results (from the past 240 hour(s))  Resp Panel by RT-PCR (Flu A&B, Covid) Nasopharyngeal Swab     Status: None   Collection Time: 02/08/21 10:09 PM   Specimen: Nasopharyngeal Swab; Nasopharyngeal(NP) swabs in vial transport medium  Result Value Ref Hausler Status   SARS Coronavirus 2 by RT PCR NEGATIVE NEGATIVE Final    Comment: (NOTE) SARS-CoV-2 target nucleic acids are NOT DETECTED.  The SARS-CoV-2 RNA is generally detectable in upper respiratory specimens during the acute phase of infection. The lowest concentration of SARS-CoV-2 viral copies this assay can detect is 138 copies/mL. A negative result does not  preclude SARS-Cov-2 infection and should not be used as the sole basis for treatment or other patient management decisions. A negative result may occur with  improper specimen collection/handling, submission of specimen other than nasopharyngeal swab, presence of viral mutation(s) within the areas targeted by this assay, and inadequate number of viral copies(<138 copies/mL). A negative result must be combined with clinical observations, patient history, and epidemiological information. The expected result is Negative.  Fact Sheet for Patients:  EntrepreneurPulse.com.au  Fact Sheet for Healthcare Providers:  IncredibleEmployment.be  This test is no t yet approved or cleared by the [place] FDA and  has been authorized for detection and/or diagnosis of SARS-CoV-2 by FDA under an Emergency Use Authorization (EUA). This EUA will remain  in effect (meaning this test can be used) for the duration of the COVID-19 declaration under Section 564(b)(1) of the Act, 21 U.S.C.section 360bbb-3(b)(1), unless the authorization is terminated  or revoked sooner.       Influenza A by PCR NEGATIVE NEGATIVE Final   Influenza B by PCR NEGATIVE NEGATIVE Final    Comment: (NOTE) The Xpert Xpress SARS-CoV-2/FLU/RSV plus assay is intended as an aid in the diagnosis of influenza from Nasopharyngeal swab specimens and should not be used as a sole basis for treatment. Nasal washings and aspirates are unacceptable for Xpert Xpress SARS-CoV-2/FLU/RSV testing.  Fact Sheet for Patients: EntrepreneurPulse.com.au  Fact Sheet for Healthcare Providers: IncredibleEmployment.be  This test is not yet approved or cleared by the [place] FDA and has been authorized for detection and/or diagnosis of SARS-CoV-2 by FDA under an Emergency Use Authorization (EUA). This EUA will remain in effect (meaning this test can be used) for the  duration of the COVID-19 declaration under Section 564(b)(1) of the Act, 21 U.S.C. section 360bbb-3(b)(1), unless the authorization is terminated or revoked.  Performed at [facility], [address],  [place], [place] [address]   Surgical PCR screen     Status: None   Collection Time: 02/09/21  1:04 AM   Specimen: Nasal Mucosa; Nasal Swab  Result Value Ref Robel Status   MRSA, PCR NEGATIVE NEGATIVE Final   Staphylococcus aureus NEGATIVE NEGATIVE Final    Comment: (NOTE) The Xpert SA Assay (FDA approved for NASAL specimens in patients 8 years of age and older), is one component of a comprehensive surveillance program. It is not intended to diagnose infection nor to guide or monitor treatment. Performed at [facility], [address] [place], [place] [address]   Resp Panel by RT-PCR (Flu A&B, Covid) Nasopharyngeal Swab     Status: None   Collection Time: 02/13/21  2:38 PM   Specimen: Nasopharyngeal Swab; Nasopharyngeal(NP) swabs in vial transport medium  Result Value Ref Hupp Status   SARS Coronavirus 2 by RT PCR NEGATIVE NEGATIVE Final    Comment: (NOTE) SARS-CoV-2 target nucleic acids are NOT DETECTED.  The SARS-CoV-2 RNA is generally detectable in upper respiratory specimens during the acute phase of infection. The lowest concentration of SARS-CoV-2 viral copies this assay can detect is 138 copies/mL. A negative result does not preclude SARS-Cov-2 infection and should not be used as the sole basis for treatment or other patient management decisions. A negative result may occur with  improper specimen collection/handling, submission of specimen other than nasopharyngeal swab, presence of viral mutation(s) within the areas targeted by this assay, and inadequate number of viral copies(<138 copies/mL). A negative result must be combined with clinical observations, patient history, and epidemiological information. The expected result is Negative.  Fact  Sheet for Patients:  EntrepreneurPulse.com.au  Fact Sheet for Healthcare Providers:  IncredibleEmployment.be  This test is no t yet approved or cleared by the [place] FDA and  has been authorized for detection and/or diagnosis of SARS-CoV-2 by FDA under an Emergency Use Authorization (EUA). This EUA will remain  in effect (meaning this test can be used) for the duration of the COVID-19 declaration under Section 564(b)(1) of the Act, 21 U.S.C.section 360bbb-3(b)(1), unless the authorization is terminated  or revoked sooner.       Influenza A by PCR NEGATIVE NEGATIVE Final   Influenza B by PCR NEGATIVE NEGATIVE Final    Comment: (NOTE) The Xpert Xpress SARS-CoV-2/FLU/RSV plus assay is intended as an aid in the diagnosis of influenza from Nasopharyngeal swab specimens and should not be used as a sole basis for treatment. Nasal washings and aspirates are unacceptable for Xpert Xpress SARS-CoV-2/FLU/RSV testing.  Fact Sheet for Patients: EntrepreneurPulse.com.au  Fact Sheet for Healthcare Providers: IncredibleEmployment.be  This test is not yet approved or cleared by the [place] FDA and has been authorized for detection and/or diagnosis of SARS-CoV-2 by FDA under an Emergency Use Authorization (EUA). This EUA will remain in effect (meaning this test can be used) for the duration of the COVID-19 declaration under Section 564(b)(1) of the Act, 21 U.S.C. section 360bbb-3(b)(1), unless the authorization is terminated or revoked.  Performed at [facility], [address] [place], [place] [address]      Labs: BNP (last 3 results) No results for input(s): BNP in the last 8760 hours. Basic Metabolic Panel: Recent Labs  Lab 02/09/21 0413 02/10/21 0405 02/11/21 0453 02/12/21 0300 02/13/21 0631  NA 128* 131* 127* 125* 125*  K 3.9 4.1 4.0 4.2 4.2  CL 98 99 97* 94* 94*  CO2 24 25 25  25 25   GLUCOSE 107* 97 99  107* 96  BUN 12 12 11 8 9   CREATININE 0.63 0.68 0.55* 0.41* 0.52*  CALCIUM 8.8* 8.1* 7.8* 7.9* 7.9*   Liver Function Tests: No results for input(s): AST, ALT, ALKPHOS, BILITOT, PROT, ALBUMIN in the last 168 hours. No results for input(s): LIPASE, AMYLASE in the last 168 hours. No results for input(s): AMMONIA in the last 168 hours. CBC: Recent Labs  Lab 02/08/21 2103 02/09/21 0413 02/10/21 0405 02/11/21 0453 02/13/21 0631  WBC 9.8 12.6* 9.5 9.6 7.8  NEUTROABS 5.3  --   --   --   --   HGB 15.0 14.3 11.9* 10.2* 9.8*  HCT 41.5 42.8 34.3* 29.7* 27.1*  MCV 96.1 99.1 99.4 97.1 95.4  PLT 207 179 124* 121* 133*   Cardiac Enzymes: No results for input(s): CKTOTAL, CKMB, CKMBINDEX, TROPONINI in the last 168 hours. BNP: Invalid input(s): POCBNP CBG: No results for input(s): GLUCAP in the last 168 hours. D-Dimer No results for input(s): DDIMER in the last 72 hours. Hgb A1c No results for input(s): HGBA1C in the last 72 hours. Lipid Profile No results for input(s): CHOL, HDL, LDLCALC, TRIG, CHOLHDL, LDLDIRECT in the last 72 hours. Thyroid function studies No results for input(s): TSH, T4TOTAL, T3FREE, THYROIDAB in the last 72 hours.  Invalid input(s): FREET3 Anemia work up Recent Labs    02/12/21 0300 02/12/21 1607  VITAMINB12  --  145*  FOLATE 7.5  --   FERRITIN 425*  --   TIBC 189*  --   IRON 19*  --   RETICCTPCT 2.1  --    Urinalysis No results found for: COLORURINE, APPEARANCEUR, LABSPEC, [place], GLUCOSEU, HGBUR, BILIRUBINUR, KETONESUR, PROTEINUR, UROBILINOGEN, NITRITE, LEUKOCYTESUR Sepsis Labs Invalid input(s): PROCALCITONIN,  WBC,  LACTICIDVEN Microbiology Recent Results (from the past 240 hour(s))  Resp Panel by RT-PCR (Flu A&B, Covid) Nasopharyngeal Swab     Status: None   Collection Time: 02/08/21 10:09 PM   Specimen: Nasopharyngeal Swab; Nasopharyngeal(NP) swabs in vial transport medium  Result Value Ref Pick[place] Status   SARS  Coronavirus 2 by RT PCR NEGATIVE NEGATIVE Final    Comment: (NOTE) SARS-CoV-2 target nucleic acids are NOT DETECTED.  The SARS-CoV-2 RNA is generally detectable in upper respiratory specimens during the acute phase of infection. The lowest concentration of SARS-CoV-2 viral copies this assay can detect is 138 copies/mL. A negative result does not preclude SARS-Cov-2 infection and should not be used as the sole basis for treatment or other patient management decisions. A negative result may occur with  improper specimen collection/handling, submission of specimen other than nasopharyngeal swab, presence of viral mutation(s) within the areas targeted by this assay, and inadequate number of viral copies(<138 copies/mL). A negative result must be combined with clinical observations, patient history, and epidemiological information. The expected result is Negative.  Fact Sheet for Patients:  EntrepreneurPulse.com.au  Fact Sheet for Healthcare Providers:  IncredibleEmployment.be  This test is no t yet approved or cleared by the [place] FDA and  has been authorized for detection and/or diagnosis of SARS-CoV-2 by FDA under an Emergency Use Authorization (EUA). This EUA will remain  in effect (meaning this test can be used) for the duration of the COVID-19 declaration under Section 564(b)(1) of the Act, 21 U.S.C.section 360bbb-3(b)(1), unless the authorization is terminated  or revoked sooner.       Influenza A by PCR NEGATIVE NEGATIVE Final   Influenza B by PCR NEGATIVE NEGATIVE Final    Comment: (NOTE) The Xpert Xpress SARS-CoV-2/FLU/RSV plus assay is intended  as an aid in the diagnosis of influenza from Nasopharyngeal swab specimens and should not be used as a sole basis for treatment. Nasal washings and aspirates are unacceptable for Xpert Xpress SARS-CoV-2/FLU/RSV testing.  Fact Sheet for  Patients: EntrepreneurPulse.com.au  Fact Sheet for Healthcare Providers: IncredibleEmployment.be  This test is not yet approved or cleared by the [place] FDA and has been authorized for detection and/or diagnosis of SARS-CoV-2 by FDA under an Emergency Use Authorization (EUA). This EUA will remain in effect (meaning this test can be used) for the duration of the COVID-19 declaration under Section 564(b)(1) of the Act, 21 U.S.C. section 360bbb-3(b)(1), unless the authorization is terminated or revoked.  Performed at [facility], [address], [place], [place] [address]   Surgical PCR screen     Status: None   Collection Time: 02/09/21  1:04 AM   Specimen: Nasal Mucosa; Nasal Swab  Result Value Ref Ernsberger Status   MRSA, PCR NEGATIVE NEGATIVE Final   Staphylococcus aureus NEGATIVE NEGATIVE Final    Comment: (NOTE) The Xpert SA Assay (FDA approved for NASAL specimens in patients 71 years of age and older), is one component of a comprehensive surveillance program. It is not intended to diagnose infection nor to guide or monitor treatment. Performed at [facility], [address] [place], [place] [address]   Resp Panel by RT-PCR (Flu A&B, Covid) Nasopharyngeal Swab     Status: None   Collection Time: 02/13/21  2:38 PM   Specimen: Nasopharyngeal Swab; Nasopharyngeal(NP) swabs in vial transport medium  Result Value Ref And[place] Status   SARS Coronavirus 2 by RT PCR NEGATIVE NEGATIVE Final    Comment: (NOTE) SARS-CoV-2 target nucleic acids are NOT DETECTED.  The SARS-CoV-2 RNA is generally detectable in upper respiratory specimens during the acute phase of infection. The lowest concentration of SARS-CoV-2 viral copies this assay can detect is 138 copies/mL. A negative result does not preclude SARS-Cov-2 infection and should not be used as the sole basis for treatment or other patient management decisions. A negative  result may occur with  improper specimen collection/handling, submission of specimen other than nasopharyngeal swab, presence of viral mutation(s) within the areas targeted by this assay, and inadequate number of viral copies(<138 copies/mL). A negative result must be combined with clinical observations, patient history, and epidemiological information. The expected result is Negative.  Fact Sheet for Patients:  EntrepreneurPulse.com.au  Fact Sheet for Healthcare Providers:  IncredibleEmployment.be  This test is no t yet approved or cleared by the [place] FDA and  has been authorized for detection and/or diagnosis of SARS-CoV-2 by FDA under an Emergency Use Authorization (EUA). This EUA will remain  in effect (meaning this test can be used) for the duration of the COVID-19 declaration under Section 564(b)(1) of the Act, 21 U.S.C.section 360bbb-3(b)(1), unless the authorization is terminated  or revoked sooner.       Influenza A by PCR NEGATIVE NEGATIVE Final   Influenza B by PCR NEGATIVE NEGATIVE Final    Comment: (NOTE) The Xpert Xpress SARS-CoV-2/FLU/RSV plus assay is intended as an aid in the diagnosis of influenza from Nasopharyngeal swab specimens and should not be used as a sole basis for treatment. Nasal washings and aspirates are unacceptable for Xpert Xpress SARS-CoV-2/FLU/RSV testing.  Fact Sheet for Patients: EntrepreneurPulse.com.au  Fact Sheet for Healthcare Providers: IncredibleEmployment.be  This test is not yet approved or cleared by the [place] FDA and has been authorized for detection and/or diagnosis of SARS-CoV-2 by FDA  under an Emergency Use Authorization (EUA). This EUA will remain in effect (meaning this test can be used) for the duration of the COVID-19 declaration under Section 564(b)(1) of the Act, 21 U.S.C. section 360bbb-3(b)(1), unless the authorization is  terminated or revoked.  Performed at [facility], [address] [place], [place] [address]     Time coordinating discharge: Over 30 minutes  SIGNED:  Lorella Nimrod, MD  [facility] 02/14/2021, 10:02 AM  If 7PM-7AM, please contact night-coverage [email or web address]  This record has been created using Systems analyst. Errors have been sought and corrected,but may not always be located. Such creation errors do not reflect on the standard of care.

## 2021-02-14 NOTE — TOC Transition Note (Signed)
Transition of Care [facility]) - CM/SW Discharge Note   Patient Details  Name: Jordan Barber MRN: [id number] Date of Birth: 05-03-1945  [facility]) CM/SW Contact:  Pete Pelt, RN Phone Number: 02/14/2021, 1:39 PM   Clinical Narrative:   Patient is discharging to room 501 at [facility] today as per [place].  Patient will be transported by EMS.  Patient and family aware.    Final next level of care: [place]     Patient Goals and CMS Choice     Choice offered to / list presented to : NA  Discharge Placement                       Discharge Plan and Services   Discharge Planning Services: CM Consult Post Acute Care Choice: Durable Medical Equipment, Home Health          DME Arranged: Gilford Rile platform DME Agency: [employer] Date DME Agency Contacted: 02/10/21 Time DME Agency Contacted: 41 Representative spoke with at DME Agency: [place]: PT, OT          Social Determinants of Health ([place]) Interventions     Readmission Risk Interventions No flowsheet data found.

## 2021-02-14 NOTE — Progress Notes (Signed)
Physical Therapy Treatment Patient Details Name: Jordan Barber MRN: [id number] DOB: 12-Feb-1946 Today's Date: 02/14/2021   History of Present Illness Pt is a 75 y/o M admitted on 02/08/21. Pt fell while walking his dog & had pain in R wrist & hip. Pt underwent R intertrochanteric IM nail & R wrist ORIF of distal radial fx. Pt can weight bear through RUE elbow to use platform walker but is otherwise NWB through RUE forearm, wrist, & hand. RLE WBAT. PMH: prostate CA, tobacco abuse    PT Comments    Pt had just returned to bed after using the commode upon arrival. Session focused on B LE strengthening exercises 10-20 reps each in supine. Discussed Rehab environment and answered questions regarding what pt will need.  Pt is much more alert and bright today with decrease in pain. Expect short rehab stay once initiated.    Recommendations for follow up therapy are one component of a multi-disciplinary discharge planning process, led by the attending physician.  Recommendations may be updated based on patient status, additional functional criteria and insurance authorization.  Follow Up Recommendations  SNF     Equipment Recommendations  None recommended by PT    Recommendations for Other Services       Precautions / Restrictions Precautions Precautions: Fall Restrictions Weight Bearing Restrictions: Yes RUE Weight Bearing: Non weight bearing RLE Weight Bearing: Weight bearing as tolerated     Mobility  Bed Mobility                    Transfers                    Ambulation/Gait                 Stairs             Wheelchair Mobility    Modified Rankin (Stroke Patients Only)       Balance                                            Cognition Arousal/Alertness: Awake/alert Behavior During Therapy: WFL for tasks assessed/performed Overall Cognitive Status: Within Functional Limits for tasks assessed                                  General Comments: Pleasant gentleman, hard worker      Exercises General Exercises - Lower Extremity Ankle Circles/Pumps: AROM;Both;20 reps;Supine Quad Sets: AROM;Both;20 reps;Supine Gluteal Sets: AROM;Both;20 reps;Supine Heel Slides: AAROM;Strengthening;Right;10 reps;Supine Hip ABduction/ADduction: AAROM;Strengthening;Right;10 reps;Supine    General Comments General comments (skin integrity, edema, etc.): Pt much more bright and alert today      Pertinent Vitals/Pain Pain Assessment: No/denies pain    Home Living                      Prior Function            PT Goals (current goals can now be found in the care plan section) Acute Rehab PT Goals Patient Stated Goal: get better, go home Progress towards PT goals: Progressing toward goals    Frequency    BID      PT Plan Current plan remains appropriate    Co-evaluation  AM-PAC PT "6 Clicks" Mobility   Outcome Measure  Help needed turning from your back to your side while in a flat bed without using bedrails?: A Little Help needed moving from lying on your back to sitting on the side of a flat bed without using bedrails?: A Lot Help needed moving to and from a bed to a chair (including a wheelchair)?: A Little Help needed standing up from a chair using your arms (e.g., wheelchair or bedside chair)?: A Lot Help needed to walk in hospital room?: A Lot Help needed climbing 3-5 steps with a railing? : Total 6 Click Score: 13    End of Session   Activity Tolerance: Patient tolerated treatment well Patient left: in bed;with call bell/phone within reach;with family/visitor present;with SCD's reapplied Nurse Communication: Mobility status PT Visit Diagnosis: Unsteadiness on feet (R26.81);Muscle weakness (generalized) (M62.81);Difficulty in walking, not elsewhere classified (R26.2);Pain     Time: 1100-1120 PT Time Calculation (min) (ACUTE ONLY): 20 min  Charges:   $Therapeutic Exercise: 8-22 mins                   Mikel Cella, PTA  [name] 02/14/2021, 2:17 PM

## 2021-02-14 NOTE — Plan of Care (Signed)

## 2021-02-17 DIAGNOSIS — I10 Essential (primary) hypertension: Secondary | ICD-10-CM | POA: Insufficient documentation

## 2021-02-17 DIAGNOSIS — E538 Deficiency of other specified B group vitamins: Secondary | ICD-10-CM | POA: Insufficient documentation

## 2021-02-18 NOTE — Anesthesia Postprocedure Evaluation (Signed)
Anesthesia Post Note  Patient: Lorraine Terriquez Szymanowski  Procedure(s) Performed: INTRAMEDULLARY (IM) NAIL INTERTROCHANTRIC (Right: Hip) OPEN REDUCTION INTERNAL FIXATION (ORIF) DISTAL RADIAL FRACTURE (Right: Arm Lower)  Patient location during evaluation: PACU Anesthesia Type: General Level of consciousness: awake and alert Pain management: pain level controlled Vital Signs Assessment: post-procedure vital signs reviewed and stable Respiratory status: spontaneous breathing, nonlabored ventilation, respiratory function stable and patient connected to nasal cannula oxygen Cardiovascular status: blood pressure returned to baseline and stable Postop Assessment: no apparent nausea or vomiting Anesthetic complications: no   No notable events documented.   Last Vitals:  Vitals:   02/14/21 1103 02/14/21 1528  BP: 121/67 108/66  Pulse: 75 84  Resp: 16 16  Temp: 36.9 C 36.7 C  SpO2: 95% 93%    Last Pain:  Vitals:   02/14/21 0742  TempSrc: Oral  PainSc: 6                  Martha Clan

## 2021-03-27 DIAGNOSIS — G459 Transient cerebral ischemic attack, unspecified: Secondary | ICD-10-CM

## 2021-03-27 HISTORY — DX: Transient cerebral ischemic attack, unspecified: G45.9

## 2021-04-19 ENCOUNTER — Other Ambulatory Visit: Payer: Self-pay

## 2021-04-19 ENCOUNTER — Observation Stay
Admission: EM | Admit: 2021-04-19 | Discharge: 2021-04-20 | Disposition: A | Discharge status: Home / Self Care | Payer: Medicare Other | Attending: Internal Medicine | Admitting: Internal Medicine

## 2021-04-19 ENCOUNTER — Observation Stay
Admit: 2021-04-19 | Discharge: 2021-04-19 | Disposition: A | Discharge status: Home / Self Care | Payer: Medicare Other | Attending: Family Medicine | Admitting: Family Medicine

## 2021-04-19 ENCOUNTER — Observation Stay: Payer: Medicare Other

## 2021-04-19 ENCOUNTER — Emergency Department: Payer: Medicare Other

## 2021-04-19 ENCOUNTER — Encounter: Payer: Self-pay | Admitting: Intensive Care

## 2021-04-19 DIAGNOSIS — I10 Essential (primary) hypertension: Secondary | ICD-10-CM | POA: Insufficient documentation

## 2021-04-19 DIAGNOSIS — D649 Anemia, unspecified: Secondary | ICD-10-CM | POA: Diagnosis not present

## 2021-04-19 DIAGNOSIS — G459 Transient cerebral ischemic attack, unspecified: Principal | ICD-10-CM

## 2021-04-19 DIAGNOSIS — I2510 Atherosclerotic heart disease of native coronary artery without angina pectoris: Secondary | ICD-10-CM | POA: Diagnosis not present

## 2021-04-19 DIAGNOSIS — Z87891 Personal history of nicotine dependence: Secondary | ICD-10-CM | POA: Insufficient documentation

## 2021-04-19 DIAGNOSIS — Z7901 Long term (current) use of anticoagulants: Secondary | ICD-10-CM | POA: Diagnosis not present

## 2021-04-19 DIAGNOSIS — Z8546 Personal history of malignant neoplasm of prostate: Secondary | ICD-10-CM | POA: Insufficient documentation

## 2021-04-19 DIAGNOSIS — R29810 Facial weakness: Secondary | ICD-10-CM | POA: Diagnosis present

## 2021-04-19 DIAGNOSIS — M4802 Spinal stenosis, cervical region: Secondary | ICD-10-CM | POA: Diagnosis not present

## 2021-04-19 DIAGNOSIS — Z79899 Other long term (current) drug therapy: Secondary | ICD-10-CM | POA: Insufficient documentation

## 2021-04-19 HISTORY — DX: Postherpetic geniculate ganglionitis: B02.21

## 2021-04-19 LAB — CBC
HCT: 39 % (ref 39.0–52.0)
Hemoglobin: 12.9 g/dL — ABNORMAL LOW (ref 13.0–17.0)
MCH: 32.3 pg (ref 26.0–34.0)
MCHC: 33.1 g/dL (ref 30.0–36.0)
MCV: 97.5 fL (ref 80.0–100.0)
Platelets: 250 10*3/uL (ref 150–400)
RBC: 4 MIL/uL — ABNORMAL LOW (ref 4.22–5.81)
RDW: 13.3 % (ref 11.5–15.5)
WBC: 9.2 10*3/uL (ref 4.0–10.5)
nRBC: 0 % (ref 0.0–0.2)

## 2021-04-19 LAB — PROTIME-INR
INR: 1 (ref 0.8–1.2)
Prothrombin Time: 13.4 seconds (ref 11.4–15.2)

## 2021-04-19 LAB — LIPID PANEL
Cholesterol: 190 mg/dL (ref 0–200)
HDL: 60 mg/dL (ref >40)
LDL Cholesterol: 117 mg/dL — ABNORMAL HIGH (ref 0–99)
Total CHOL/HDL Ratio: 3.2 RATIO
Triglycerides: 65 mg/dL (ref <150)
VLDL: 13 mg/dL (ref 0–40)

## 2021-04-19 LAB — DIFFERENTIAL
Abs Immature Granulocytes: 0.06 10*3/uL (ref 0.00–0.07)
Basophils Absolute: 0.1 10*3/uL (ref 0.0–0.1)
Basophils Relative: 1 %
Eosinophils Absolute: 0.4 10*3/uL (ref 0.0–0.5)
Eosinophils Relative: 5 %
Immature Granulocytes: 1 %
Lymphocytes Relative: 25 %
Lymphs Abs: 2.3 10*3/uL (ref 0.7–4.0)
Monocytes Absolute: 0.8 10*3/uL (ref 0.1–1.0)
Monocytes Relative: 8 %
Neutro Abs: 5.6 10*3/uL (ref 1.7–7.7)
Neutrophils Relative %: 60 %

## 2021-04-19 LAB — APTT: aPTT: 33 seconds (ref 24–36)

## 2021-04-19 LAB — COMPREHENSIVE METABOLIC PANEL
ALT: 15 U/L (ref 0–44)
AST: 22 U/L (ref 15–41)
Albumin: 3.6 g/dL (ref 3.5–5.0)
Alkaline Phosphatase: 84 U/L (ref 38–126)
Anion gap: 6 (ref 5–15)
BUN: 11 mg/dL (ref 8–23)
CO2: 29 mmol/L (ref 22–32)
Calcium: 9.1 mg/dL (ref 8.9–10.3)
Chloride: 100 mmol/L (ref 98–111)
Creatinine, Ser: 0.63 mg/dL (ref 0.61–1.24)
GFR, Estimated: >60 mL/min (ref >60)
Glucose, Bld: 97 mg/dL (ref 70–99)
Potassium: 3.8 mmol/L (ref 3.5–5.1)
Sodium: 135 mmol/L (ref 135–145)
Total Bilirubin: 0.9 mg/dL (ref 0.3–1.2)
Total Protein: 6.9 g/dL (ref 6.5–8.1)

## 2021-04-19 LAB — CBG MONITORING, ED: Glucose-Capillary: 125 mg/dL — ABNORMAL HIGH (ref 70–99)

## 2021-04-19 MED ORDER — ACETAMINOPHEN 325 MG PO TABS
325.0000 mg | ORAL_TABLET | Freq: Four times a day (QID) | ORAL | Status: DC | PRN
Start: 2021-04-19 — End: 2021-04-20

## 2021-04-19 MED ORDER — ASPIRIN 81 MG PO CHEW
324.0000 mg | CHEWABLE_TABLET | Freq: Once | ORAL | Status: AC
  Administered 2021-04-19: 16:00:00 324 mg via ORAL
  Filled 2021-04-19: qty 4

## 2021-04-19 MED ORDER — ASPIRIN EC 81 MG PO TBEC
81.0000 mg | DELAYED_RELEASE_TABLET | Freq: Every day | ORAL | Status: DC
  Administered 2021-04-20: 09:00:00 81 mg via ORAL
  Filled 2021-04-19: qty 1

## 2021-04-19 MED ORDER — STROKE: EARLY STAGES OF RECOVERY BOOK: Freq: Once | Status: AC

## 2021-04-19 MED ORDER — FLUTICASONE PROPIONATE 50 MCG/ACT NA SUSP
2.0000 | Freq: Every day | NASAL | Status: DC
  Administered 2021-04-20: 06:00:00 2 via NASAL
  Filled 2021-04-19: qty 16

## 2021-04-19 MED ORDER — ACETAMINOPHEN 160 MG/5ML PO SOLN
650.0000 mg | ORAL | Status: DC | PRN
  Filled 2021-04-19: qty 20.3

## 2021-04-19 MED ORDER — ACETAMINOPHEN 325 MG PO TABS: 650.0000 mg | ORAL_TABLET | ORAL | Status: DC | PRN

## 2021-04-19 MED ORDER — LORATADINE 10 MG PO TABS
10.0000 mg | ORAL_TABLET | Freq: Every day | ORAL | Status: DC
  Administered 2021-04-19 – 2021-04-20 (×2): 10 mg via ORAL
  Filled 2021-04-19 (×2): qty 1

## 2021-04-19 MED ORDER — HYDROCODONE-ACETAMINOPHEN 5-325 MG PO TABS
1.0000 | ORAL_TABLET | ORAL | Status: DC | PRN
Start: 2021-04-19 — End: 2021-04-20

## 2021-04-19 MED ORDER — AMLODIPINE BESYLATE 5 MG PO TABS
2.5000 mg | ORAL_TABLET | Freq: Every day | ORAL | Status: DC
  Administered 2021-04-19 – 2021-04-20 (×2): 2.5 mg via ORAL
  Filled 2021-04-19 (×2): qty 1

## 2021-04-19 MED ORDER — GUAIFENESIN-DM 100-10 MG/5ML PO SYRP: 5.0000 mL | ORAL_SOLUTION | ORAL | Status: DC | PRN

## 2021-04-19 MED ORDER — ACETAMINOPHEN 650 MG RE SUPP: 650.0000 mg | RECTAL | Status: DC | PRN

## 2021-04-19 MED ORDER — SALINE SPRAY 0.65 % NA SOLN
1.0000 | NASAL | Status: DC | PRN
  Filled 2021-04-19: qty 44

## 2021-04-19 MED ORDER — SENNOSIDES-DOCUSATE SODIUM 8.6-50 MG PO TABS: 1.0000 | ORAL_TABLET | Freq: Every evening | ORAL | Status: DC | PRN

## 2021-04-19 MED ORDER — ENOXAPARIN SODIUM 40 MG/0.4ML IJ SOSY
40.0000 mg | PREFILLED_SYRINGE | INTRAMUSCULAR | Status: DC
  Filled 2021-04-19 (×2): qty 0.4

## 2021-04-19 MED ORDER — IOHEXOL 350 MG/ML SOLN
75.0000 mL | Freq: Once | INTRAVENOUS | Status: AC | PRN
  Administered 2021-04-19: 13:00:00 75 mL via INTRAVENOUS
  Filled 2021-04-19: qty 75

## 2021-04-19 NOTE — Progress Notes (Signed)
*  PRELIMINARY RESULTS* Echocardiogram 2D Echocardiogram has been performed.  Jordan Barber 04/19/2021, 5:52 PM

## 2021-04-19 NOTE — ED Notes (Signed)
Patient transported to MRI 

## 2021-04-19 NOTE — H&P (Signed)
H&P:    Jordan Barber   TJQ:[id number] DOB: 12/08/1945 DOA: 04/19/2021  PCP: Albina Billet, MD  Chief Complaint: Facial droop, slurred speech   History of Present Illness:    HPI: Jordan Barber is a 75 y.o. male with a past medical history of Ramsay Hunt syndrome, former tobacco dependence (quit in October 2022), history of prostate cancer, hypertension, dyslipidemia.  This patient went to bed at 11 PM last night.  This was last known well time.  Woke up 6:30 AM but his symptoms were not noted until 7:30 AM when his wife woke up and he started to talk with her.  Noted slurred speech.  Left facial droop as well.  No known history of TIA or CVA.  No focal neurological deficit.  Is not on any antiplatelet or anticoagulation at home.  Upon my evaluation he is resting comfortably.  Mild left facial droop.  No slurred speech noted which is resolved now..  Forehead sparing is noted.  ED Course: CT head showed no acute intracranial findings.  Labs are fairly unremarkable aside from a mild anemia.  He has been ordered full dose aspirin.  Neurology has been contacted and recommended admission for observation and TIA work-up.   ROS:   14 point review of systems is negative except for what is mentioned above in the HPI.   Past Medical History:   Past Medical History:  Diagnosis Date   Fall    Hip fracture, right, closed, initial encounter ([place]) 02/08/2021   Prostate cancer ([place])    Ramsay Hunt syndrome (geniculate herpes zoster)    Tobacco abuse     Past Surgical History:   Past Surgical History:  Procedure Laterality Date   COLONOSCOPY  10/18/2006   COLONOSCOPY WITH PROPOFOL N/A 02/03/2017   Procedure: COLONOSCOPY WITH PROPOFOL;  Surgeon: Robert Bellow, MD;  Location: [facility] ENDOSCOPY;  Service: Endoscopy;  Laterality: N/A;   INTRAMEDULLARY (IM) NAIL INTERTROCHANTERIC Right 02/09/2021   Procedure: INTRAMEDULLARY (IM) NAIL INTERTROCHANTRIC;  Surgeon: Corky Mull, MD;   Location: [facility] ORS;  Service: Orthopedics;  Laterality: Right;   OPEN REDUCTION INTERNAL FIXATION (ORIF) DISTAL RADIAL FRACTURE Right 02/09/2021   Procedure: OPEN REDUCTION INTERNAL FIXATION (ORIF) DISTAL RADIAL FRACTURE;  Surgeon: Corky Mull, MD;  Location: [facility] ORS;  Service: Orthopedics;  Laterality: Right;   ORIF TIBIA PLATEAU Left 11/29/2015   Procedure: OPEN REDUCTION INTERNAL FIXATION (ORIF) TIBIAL PLATEAU;  Surgeon: Corky Mull, MD;  Location: [facility] ORS;  Service: Orthopedics;  Laterality: Left;   PROSTATE SURGERY  2011    Social History:   Social History   Socioeconomic History   Marital status: Married    Spouse name: Not on file   Number of children: Not on file   Years of education: Not on file   Highest education level: Not on file  Occupational History   Not on file  Tobacco Use   Smoking status: Former    Packs/day: 1.00    Years: 50.00    Pack years: 50.00    Types: Cigarettes    Quit date: 02/08/2021    Years since quitting: 0.1   Smokeless tobacco: Never  Vaping Use   Vaping Use: Never used  Substance and Sexual Activity   Alcohol use: Yes    Comment: occasional beers   Drug use: No   Sexual activity: Not on file  Other Topics Concern   Not on file  Social History Narrative   Not on  file   Social Determinants of Health   Financial Resource Strain: Not on file  Food Insecurity: Not on file  Transportation Needs: Not on file  Physical Activity: Not on file  Stress: Not on file  Social Connections: Not on file  Intimate Partner Violence: Not on file    Allergies:   No Known Allergies  Family History:   Family History  Problem Relation Age of Onset   Dementia Mother    Heart disease Father      Current Medications:   Prior to Admission medications   Medication Sig Start Date End Date Taking? Authorizing Provider  acetaminophen (TYLENOL) 325 MG tablet Take 1-2 tablets (325-650 mg total) by mouth every 6 (six) hours as needed for mild  pain (pain score 1-3 or temp > 100.5). 02/14/21   Lorella Nimrod, MD  amLODipine (NORVASC) 2.5 MG tablet Take 2.5 mg by mouth daily. 01/29/21   [provider]  atorvastatin (LIPITOR) 40 MG tablet Take 1 tablet (40 mg total) by mouth daily. 02/15/21   Lorella Nimrod, MD  clobetasol ointment (TEMOVATE) 5.17 % Apply 1 application topically 2 (two) times daily.    [provider]  cyanocobalamin (,VITAMIN B-12,) 1000 MCG/ML injection Inject 1 mL (1,000 mcg total) into the muscle every 7 (seven) days. 02/20/21   Lorella Nimrod, MD  docusate sodium (COLACE) 100 MG capsule Take 1 capsule (100 mg total) by mouth 2 (two) times daily. 02/14/21   Lorella Nimrod, MD  enoxaparin (LOVENOX) 40 MG/0.4ML injection Inject 0.4 mLs (40 mg total) into the skin daily. 02/12/21   Lattie Corns, PA-C  ferrous [id number] C-folic acid (TRINSICON / FOLTRIN) capsule Take 1 capsule by mouth 2 (two) times daily after a meal. 02/14/21   Lorella Nimrod, MD  fluticasone (FLONASE) 50 MCG/ACT nasal spray Place 2 sprays into both nostrils daily. 02/14/21   Lorella Nimrod, MD  guaiFENesin-dextromethorphan (ROBITUSSIN DM) 100-10 MG/5ML syrup Take 5 mLs by mouth every 4 (four) hours as needed for cough. 02/14/21   Lorella Nimrod, MD  HYDROcodone-acetaminophen (NORCO/VICODIN) 5-325 MG tablet Take 1-2 tablets by mouth every 4 (four) hours as needed for moderate pain. 02/11/21   Lattie Corns, PA-C  lactulose (CHRONULAC) 10 GM/15ML solution Take 30 mLs (20 g total) by mouth 2 (two) times daily. 02/14/21   Lorella Nimrod, MD  loratadine (CLARITIN) 10 MG tablet Take 1 tablet (10 mg total) by mouth daily. 02/14/21   Lorella Nimrod, MD  methocarbamol (ROBAXIN) 750 MG tablet Take 1 tablet (750 mg total) by mouth every 8 (eight) hours as needed for muscle spasms. 02/11/21   Lattie Corns, PA-C  ondansetron (ZOFRAN) 4 MG tablet Take 1 tablet (4 mg total) by mouth every 6 (six) hours as needed for nausea. 02/11/21    Lattie Corns, PA-C  polyethylene glycol (MIRALAX / GLYCOLAX) 17 g packet Take 17 g by mouth daily. 02/14/21   Lorella Nimrod, MD  sodium chloride (OCEAN) 0.65 % SOLN nasal spray Place 1 spray into both nostrils as needed for congestion. 02/14/21   Lorella Nimrod, MD     Physical Exam:   Vitals:   04/19/21 0857 04/19/21 0858 04/19/21 1045  BP: (!) 141/80  (!) 150/86  Pulse: 78  72  Resp: 16  15  Temp: 97.7 F (36.5 C)    TempSrc: Oral    SpO2: 95%  98%  Weight:  69.9 kg   Height:  6' (1.829 m)      General:  Jordan Barber  calm and comfortable and is in NAD Cardiovascular:  RRR, no m/r/g.  Respiratory:   CTA bilaterally with no wheezes/rales/rhonchi.  Normal respiratory effort. Abdomen:  soft, NT, ND, NABS Skin:  no rash or induration seen on limited exam Musculoskeletal:  grossly normal tone BUE/BLE, good ROM, no bony abnormality Lower extremity:  No LE edema.  Limited foot exam with no ulcerations.  2+ distal pulses. Psychiatric:  grossly normal mood and affect, speech fluent and appropriate, AOx3 Neurologic:  CN 2-12 grossly intact, moves all extremities in coordinated fashion, sensation intact, mild left facial droop.  Forehead is spared.  No focal neurological deficits.  5 out of 5 muscle strength throughout.    Data Review:    Radiological Exams on Admission: Independently reviewed - see discussion in A/P where applicable  CT HEAD WO CONTRAST  Result Date: 04/19/2021 CLINICAL DATA:  Facial droop EXAM: CT HEAD WITHOUT CONTRAST TECHNIQUE: Contiguous axial images were obtained from the base of the skull through the vertex without intravenous contrast. COMPARISON:  12/02/2017 FINDINGS: Brain: Mild age related volume loss and early chronic small vessel disease changes. No acute intracranial abnormality. Specifically, no hemorrhage, hydrocephalus, mass lesion, acute infarction, or significant intracranial injury. Vascular: No hyperdense vessel or unexpected calcification.  Skull: No acute calvarial abnormality. Sinuses/Orbits: Extensive sinus disease. Air-fluid level in the right maxillary sinus. Complete opacification of left maxillary sinus, ethmoid air cells, and right frontal sinus. Mucosal thickening in the left frontal sinus and sphenoid sinuses. Findings have progressed since prior study. Other: None IMPRESSION: No acute intracranial abnormality. 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 Electronically Signed   By: Rolm Baptise M.D.   On: 04/19/2021 09:24    EKG: Independently reviewed.  Sinus rhythm, rate 72 bpm   Labs on Admission: I have personally reviewed the available labs and imaging studies at the time of the admission.  Pertinent labs on Admission: Hemoglobin 12.9     Assessment/Plan:    Left facial droop, slurred speech: Likely secondary to TIA.  Start aspirin 81 mg daily.  Obtain echocardiogram.  Obtain CT angiogram head and neck.  Obtain MRI of the brain without contrast.  Neurology aware and will see the patient in consultation.  Obtain A1c and lipid panel.  Continue monitoring on telemetry.  CT head showed no acute findings.  Hypertension: Continue home amlodipine  Hyperlipidemia: No longer on statin therapy.   Other information:    Level of Care: MedSurg telemetry DVT prophylaxis: Lovenox subcu Code Status: Full code Consults: Neurology Admission status: Observation   Leslee Home DO [facility]   How to contact the [facility] Attending or Consulting provider [place] or covering provider during after hours [place], for this patient?  Check the care team in [facility] and look for a) attending/consulting [facility] provider listed and b) the [facility] team listed Log into [email or web address] and use [place]'s universal password to access. If you do not have the password, please contact the hospital operator. Locate the [facility] provider you are looking for under [facility] and page to a number that you can be directly reached. If you still  have difficulty reaching the provider, please page the [facility] (Director on Call) for the Hospitalists listed on amion for assistance.   04/19/2021, 11:38 AM

## 2021-04-19 NOTE — ED Notes (Signed)
Neurology at the bedside

## 2021-04-19 NOTE — ED Notes (Signed)
[place] sent to the lab.  

## 2021-04-19 NOTE — Progress Notes (Signed)
OT Cancellation Note  Patient Details Name: Jordan Barber MRN: [id number] DOB: Jul 11, 1945   Cancelled Treatment:    Reason Eval/Treat Not Completed: OT screened, no needs identified, will sign off Upon speaking with RN, pt has been observed being indep for fxl mobility and demos no FMC deficits. Only focal deficit at this time is pt's facial droop and slurred speech. NO acute needs perceived. Please re-consult should needs arise.   Gerrianne Scale, [place], OTR/L ascom [phone] 04/19/21, 12:56 PM

## 2021-04-19 NOTE — Plan of Care (Signed)
°  Problem: Education: Goal: Knowledge of General Education information will improve Description: Including pain rating scale, medication(s)/side effects and non-pharmacologic comfort measures Outcome: Progressing   Problem: Health Behavior/Discharge Planning: Goal: Ability to manage health-related needs will improve Outcome: Progressing   Problem: Nutrition: Goal: Adequate nutrition will be maintained Outcome: Progressing   Problem: Pain Managment: Goal: General experience of comfort will improve Outcome: Progressing   Problem: Nutrition: Goal: Risk of aspiration will decrease Outcome: Progressing

## 2021-04-19 NOTE — ED Triage Notes (Addendum)
Patient presents with noted facial droop and slurred speech per patient. Patient reports going to bed at 11pm normal and when awakening this AM at 7:30 he noticed facial droop when looking in mirror. Bilateral hand grips strong. A&O x4. HX ramsay hunt syndrome and reports similar symptoms 3-4 years ago

## 2021-04-19 NOTE — Progress Notes (Signed)
PT Cancellation Note  Patient Details Name: Jordan Barber MRN: [id number] DOB: [birth year]   Cancelled Treatment:    Reason Eval/Treat Not Completed: PT screened, no needs identified, will sign off (Chart reviewed, RN consulted. Pt of ffloor for MRI. Per RN. Pt has been AMB at his baseline level with SPC, AMB to room from other end of ED, confident and steady.) As pt is at his functional baseline for basic mobility, no skilled PT services indicated at this time. Please consider outpatient PT referral should MRI be suggestive of need for more detailed assessment.   3:57 PM, 04/19/21 Etta Grandchild, PT, DPT Physical Therapist - [facility]  [phone] ([place])     [place] C 04/19/2021, 3:56 PM

## 2021-04-19 NOTE — ED Provider Notes (Signed)
[facility] Emergency Department Provider Note ____________________________________________   Event Date/Time   First MD Initiated Contact with Patient 04/19/21 1051     (approximate)  I have reviewed the triage vital signs and the nursing notes.   HISTORY  Chief Complaint Facial Droop    HPI Jordan Barber is a 75 y.o. male with PMH as noted below including a prior episode of Ramsay Hunt syndrome, but no prior CVA or TIA history, who presents with left facial droop, noticed acutely this morning around 7:30 AM when he awoke.  The patient states that he went to bed last night around 11 PM and had no symptoms at that time.  Per the wife, the facial droop appears to already be improving.  The patient states that he felt like his speech was somewhat slurred initially but it has also improved.  He states that the symptoms are overall similar to when he had Ramsay Hunt syndrome except that that lasted for several weeks.  He denies any weakness or numbness elsewhere in his body.    Past Medical History:  Diagnosis Date   Fall    Hip fracture, right, closed, initial encounter ([place]) 02/08/2021   Prostate cancer ([place])    Ramsay Hunt syndrome (geniculate herpes zoster)    Tobacco abuse     Patient Active Problem List   Diagnosis Date Noted   Transient ischemic attack 04/19/2021   ABLA 02/11/2021   Acute respiratory failure with hypoxia ([place]) 02/10/2021   Other fractures of lower end of right radius, initial encounter for closed fracture 02/09/2021   Cigarette smoker 02/09/2021   Hyponatremia 02/09/2021   Aortic atherosclerosis ([place]) 02/09/2021   Emphysema ([place]) 02/09/2021   Hip fracture, right, closed, initial encounter ([place]) 02/08/2021   Encounter for screening colonoscopy 12/15/2016   Personal history of tobacco use, presenting hazards to health 04/18/2016   Tibial plateau fracture, left 11/29/2015    Past Surgical History:  Procedure Laterality Date    COLONOSCOPY  10/18/2006   COLONOSCOPY WITH PROPOFOL N/A 02/03/2017   Procedure: COLONOSCOPY WITH PROPOFOL;  Surgeon: Robert Bellow, MD;  Location: [facility] ENDOSCOPY;  Service: Endoscopy;  Laterality: N/A;   INTRAMEDULLARY (IM) NAIL INTERTROCHANTERIC Right 02/09/2021   Procedure: INTRAMEDULLARY (IM) NAIL INTERTROCHANTRIC;  Surgeon: Corky Mull, MD;  Location: [facility] ORS;  Service: Orthopedics;  Laterality: Right;   OPEN REDUCTION INTERNAL FIXATION (ORIF) DISTAL RADIAL FRACTURE Right 02/09/2021   Procedure: OPEN REDUCTION INTERNAL FIXATION (ORIF) DISTAL RADIAL FRACTURE;  Surgeon: Corky Mull, MD;  Location: [facility] ORS;  Service: Orthopedics;  Laterality: Right;   ORIF TIBIA PLATEAU Left 11/29/2015   Procedure: OPEN REDUCTION INTERNAL FIXATION (ORIF) TIBIAL PLATEAU;  Surgeon: Corky Mull, MD;  Location: [facility] ORS;  Service: Orthopedics;  Laterality: Left;   PROSTATE SURGERY  2011    Prior to Admission medications   Medication Sig Start Date End Date Taking? Authorizing Provider  acetaminophen (TYLENOL) 325 MG tablet Take 1-2 tablets (325-650 mg total) by mouth every 6 (six) hours as needed for mild pain (pain score 1-3 or temp > 100.5). 02/14/21   Lorella Nimrod, MD  amLODipine (NORVASC) 2.5 MG tablet Take 2.5 mg by mouth daily. 01/29/21   [provider]  atorvastatin (LIPITOR) 40 MG tablet Take 1 tablet (40 mg total) by mouth daily. 02/15/21   Lorella Nimrod, MD  clobetasol ointment (TEMOVATE) 8.10 % Apply 1 application topically 2 (two) times daily.    [provider]  cyanocobalamin (,VITAMIN B-12,)  1000 MCG/ML injection Inject 1 mL (1,000 mcg total) into the muscle every 7 (seven) days. 02/20/21   Lorella Nimrod, MD  docusate sodium (COLACE) 100 MG capsule Take 1 capsule (100 mg total) by mouth 2 (two) times daily. 02/14/21   Lorella Nimrod, MD  enoxaparin (LOVENOX) 40 MG/0.4ML injection Inject 0.4 mLs (40 mg total) into the skin daily. 02/12/21   Lattie Corns, PA-C   ferrous [id number] C-folic acid (TRINSICON / FOLTRIN) capsule Take 1 capsule by mouth 2 (two) times daily after a meal. 02/14/21   Lorella Nimrod, MD  fluticasone (FLONASE) 50 MCG/ACT nasal spray Place 2 sprays into both nostrils daily. 02/14/21   Lorella Nimrod, MD  guaiFENesin-dextromethorphan (ROBITUSSIN DM) 100-10 MG/5ML syrup Take 5 mLs by mouth every 4 (four) hours as needed for cough. 02/14/21   Lorella Nimrod, MD  HYDROcodone-acetaminophen (NORCO/VICODIN) 5-325 MG tablet Take 1-2 tablets by mouth every 4 (four) hours as needed for moderate pain. 02/11/21   Lattie Corns, PA-C  lactulose (CHRONULAC) 10 GM/15ML solution Take 30 mLs (20 g total) by mouth 2 (two) times daily. 02/14/21   Lorella Nimrod, MD  loratadine (CLARITIN) 10 MG tablet Take 1 tablet (10 mg total) by mouth daily. 02/14/21   Lorella Nimrod, MD  methocarbamol (ROBAXIN) 750 MG tablet Take 1 tablet (750 mg total) by mouth every 8 (eight) hours as needed for muscle spasms. 02/11/21   Lattie Corns, PA-C  ondansetron (ZOFRAN) 4 MG tablet Take 1 tablet (4 mg total) by mouth every 6 (six) hours as needed for nausea. 02/11/21   Lattie Corns, PA-C  polyethylene glycol (MIRALAX / GLYCOLAX) 17 g packet Take 17 g by mouth daily. 02/14/21   Lorella Nimrod, MD  sodium chloride (OCEAN) 0.65 % SOLN nasal spray Place 1 spray into both nostrils as needed for congestion. 02/14/21   Lorella Nimrod, MD    Allergies Patient has no known allergies.  Family History  Problem Relation Age of Onset   Dementia Mother    Heart disease Father     Social History Social History   Tobacco Use   Smoking status: Former    Packs/day: 1.00    Years: 50.00    Pack years: 50.00    Types: Cigarettes    Quit date: 02/08/2021    Years since quitting: 0.1   Smokeless tobacco: Never  Vaping Use   Vaping Use: Never used  Substance Use Topics   Alcohol use: Yes    Comment: occasional beers   Drug use: No    Review of  Systems  Constitutional: No fever/chills Eyes: No visual changes. ENT: No sore throat.  Positive for nasal congestion. Cardiovascular: Denies chest pain. Respiratory: Denies shortness of breath. Gastrointestinal: No vomiting or diarrhea. Genitourinary: Negative for dysuria.  Musculoskeletal: Negative for back pain. Skin: Negative for rash. Neurological: Negative for headaches.  Positive for left facial droop.   ____________________________________________   PHYSICAL EXAM:  VITAL SIGNS: ED Triage Vitals  Enc Vitals Group     BP 04/19/21 0857 (!) 141/80     Pulse Rate 04/19/21 0857 78     Resp 04/19/21 0857 16     Temp 04/19/21 0857 97.7 F (36.5 C)     Temp Source 04/19/21 0857 Oral     SpO2 04/19/21 0857 95 %     Weight 04/19/21 0858 154 lb (69.9 kg)     Height 04/19/21 0858 6' (1.829 m)     Head Circumference --  Peak Flow --      Pain Score 04/19/21 0857 0     Pain Loc --      Pain Edu? --      Excl. in [place]? --     Constitutional: Alert and oriented. Well appearing and in no acute distress. Eyes: Conjunctivae are normal.  EOMI.  PERRLA. Head: Atraumatic. Nose: No congestion/rhinnorhea. Mouth/Throat: Mucous membranes are moist.   Neck: Normal [place] of motion.  Cardiovascular: Normal rate, regular rhythm. Good peripheral circulation. Respiratory: Normal respiratory effort.  No retractions.  Gastrointestinal: No distention.  Musculoskeletal: No lower extremity edema.  Extremities warm and well perfused.  Neurologic:  Normal speech and language.  Left facial droop with forehead sparing.  Sensation intact.  Clear speech.  No pronator drift.  No ataxia on finger-to-nose.  5/5 motor strength and intact sensation to bilateral upper and lower extremities. Skin:  Skin is warm and dry. No rash noted. Psychiatric: Mood and affect are normal. Speech and behavior are normal.  ____________________________________________   LABS (all labs ordered are listed, but only  abnormal results are displayed)  Labs Reviewed  CBC - Abnormal; Notable for the following components:      Result Value   RBC 4.00 (*)    Hemoglobin 12.9 (*)    All other components within normal limits  CBG MONITORING, ED - Abnormal; Notable for the following components:   Glucose-Capillary 125 (*)    All other components within normal limits  PROTIME-INR  APTT  DIFFERENTIAL  COMPREHENSIVE METABOLIC PANEL  HEMOGLOBIN A1C  LIPID PANEL  CBG MONITORING, ED   ____________________________________________  EKG  ED ECG REPORT I, Arta Silence, the attending physician, personally viewed and interpreted this ECG.  Date: 04/19/2021 EKG Time: 1043 Rate: 72 Rhythm: normal sinus rhythm QRS Axis: normal Intervals: normal ST/T Wave abnormalities: normal Narrative Interpretation: no evidence of acute ischemia  ____________________________________________  RADIOLOGY  CT head: IMPRESSION:  No acute intracranial abnormality.     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    ____________________________________________   PROCEDURES  Procedure(s) performed: No  Procedures  Critical Care performed: No ____________________________________________   INITIAL IMPRESSION / ASSESSMENT AND PLAN / ED COURSE  Pertinent labs & imaging results that were available during my care of the patient were reviewed by me and considered in my medical decision making (see chart for details).   75 year old male with PMH as noted above including atherosclerosis presents with left facial droop that he awoke with this morning along with some slurred speech.  The slurred speech has resolved and the facial droop is improved.  On exam the vital signs are normal except for hypertension.  The patient has left facial droop with forehead sparing and an otherwise normal neurologic exam.  Differential includes Ramsay Hunt syndrome, Bell's palsy, but also TIA/CVA.  Given the forehead sparing and the  fact that the symptoms are starting to resolve so quickly, my suspicion for TIA is elevated.  The patient does have vascular risk factors including smoking.  I consulted Dr. Quinn Axe from neurology who recommends admission for TIA work-up.  ----------------------------------------- 11:40 AM on 04/19/2021 -----------------------------------------  Patient agrees with admission.  I consulted Dr. Joycelyn Schmid from the hospitalist service.  ____________________________________________   FINAL CLINICAL IMPRESSION(S) / ED DIAGNOSES  Final diagnoses:  Facial droop      NEW MEDICATIONS STARTED DURING THIS VISIT:  New Prescriptions   No medications on file     Note:  This document was prepared using Dragon voice recognition software  and may include unintentional dictation errors.    Arta Silence, MD 04/19/21 1140

## 2021-04-20 DIAGNOSIS — M4802 Spinal stenosis, cervical region: Secondary | ICD-10-CM | POA: Diagnosis not present

## 2021-04-20 DIAGNOSIS — G459 Transient cerebral ischemic attack, unspecified: Secondary | ICD-10-CM | POA: Diagnosis not present

## 2021-04-20 DIAGNOSIS — R29810 Facial weakness: Secondary | ICD-10-CM | POA: Diagnosis not present

## 2021-04-20 DIAGNOSIS — B0221 Postherpetic geniculate ganglionitis: Secondary | ICD-10-CM

## 2021-04-20 LAB — ECHOCARDIOGRAM COMPLETE
AR max vel: 3.21 cm2
AV Peak grad: 4.8 mmHg
Ao pk vel: 1.09 m/s
Area-P 1/2: 5.02 cm2
Height: 72 in
S' Lateral: 3.4 cm
Weight: 2464 oz

## 2021-04-20 MED ORDER — ATORVASTATIN CALCIUM 40 MG PO TABS: 40.0000 mg | ORAL_TABLET | Freq: Every day | ORAL | 0 refills | Status: DC

## 2021-04-20 MED ORDER — ASPIRIN 81 MG PO TBEC: 81.0000 mg | DELAYED_RELEASE_TABLET | Freq: Every day | ORAL | 11 refills | Status: AC

## 2021-04-20 MED ORDER — ATORVASTATIN CALCIUM 20 MG PO TABS
40.0000 mg | ORAL_TABLET | Freq: Every day | ORAL | Status: DC
  Administered 2021-04-20: 11:00:00 40 mg via ORAL
  Filled 2021-04-20: qty 2

## 2021-04-20 NOTE — Consult Note (Signed)
NEUROLOGY CONSULTATION NOTE   Date of service: April 20, 2021 Patient Name: Jordan Barber MRN:  [id number] DOB:  01-21-1946 Reason for consult: transient dysarthria, worsening facial droop Requesting physician: Dr. 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 LKW 2300 the evening before when he went to bed. He woke up at 0630 day of admission and did not notice anything amiss. When his wife woke up an hour later, she told him that his speech sounded slurred. She also reported that his L facial droop looked worse than usual.   Over the next few hours, dysarthria resolved, and L facial droop resolved back to baseline. Today he feels like his usual self.  CTA H&N did show severe cervical DDD and stenosis, patient denies any weakness or neck pain.  Stroke workup this admission:  MRI brain wo contrast  Mildly motion degraded exam.   No evidence of acute intracranial abnormality.   Moderate chronic small vessel ischemic changes within the cerebral white matter and pons, progressed from the brain MRI of 12/02/2017.   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   Prominent perivascular space versus chronic lacunar infarct within the left caudate head, not definitively present on the prior MRI.   Mild generalized cerebral atrophy.  CTA neck:   1. The common carotid and internal carotid arteries are patent within the neck. Atherosclerotic narrowing of the proximal right ICA of up to 30-40%. Atherosclerotic narrowing of the proximal left ICA of up to 50%. 2. Vertebral arteries patent within the neck. Sites of up to 50% stenosis within the V2 vertebral arteries bilaterally due to mass effect from degenerative osseous  spurring within the cervical spine. 3. Advanced cervical spondylosis. Notably, there is multifactorial severe spinal canal stenosis at C4-C5, C5-C6 and C6-C7. Multilevel bony neural foraminal narrowing. Reversal of the expected cervical lordosis. Mild multilevel grade 1 spondylolisthesis, as detailed. 4. Aortic Atherosclerosis (ICD10-I70.0) and Emphysema (ICD10-J43.9).   CTA head:   1. No intracranial large vessel occlusion or proximal high-grade arterial stenosis. 2. Nonstenotic calcified plaque within the intracranial internal carotid arteries. 3. Atherosclerotic irregularity of the posterior cerebral arteries. Most notably, there is a site of moderate stenosis within the right PCA at the P2/P3 junction.  CNS imaging personally reviewed; I agree with above interpretations  TTE - no intracardiac clot or other significant abnl  Stroke Labs     Component Value Date/Time   CHOL 190 04/19/2021 1220   TRIG 65 04/19/2021 1220   HDL 60 04/19/2021 1220   CHOLHDL 3.2 04/19/2021 1220   VLDL 13 04/19/2021 1220   LDLCALC 117 (H) 04/19/2021 1220    No results found for: HGBA1C;   ROS   Per HPI: all other systems reviewed and are negative  Past History   I have reviewed the following:  Past Medical History:  Diagnosis Date   Fall    Hip fracture, right, closed, initial encounter ([place]) 02/08/2021   Prostate cancer ([place])    Ramsay Hunt syndrome (geniculate herpes zoster)    Tobacco abuse    Past Surgical History:  Procedure Laterality Date   COLONOSCOPY  10/18/2006   COLONOSCOPY WITH PROPOFOL N/A  02/03/2017   Procedure: COLONOSCOPY WITH PROPOFOL;  Surgeon: Robert Bellow, MD;  Location: [facility] ENDOSCOPY;  Service: Endoscopy;  Laterality: N/A;   INTRAMEDULLARY (IM) NAIL INTERTROCHANTERIC Right 02/09/2021   Procedure: INTRAMEDULLARY (IM) NAIL INTERTROCHANTRIC;  Surgeon: Corky Mull, MD;  Location: [facility] ORS;  Service: Orthopedics;  Laterality: Right;   OPEN REDUCTION  INTERNAL FIXATION (ORIF) DISTAL RADIAL FRACTURE Right 02/09/2021   Procedure: OPEN REDUCTION INTERNAL FIXATION (ORIF) DISTAL RADIAL FRACTURE;  Surgeon: Corky Mull, MD;  Location: [facility] ORS;  Service: Orthopedics;  Laterality: Right;   ORIF TIBIA PLATEAU Left 11/29/2015   Procedure: OPEN REDUCTION INTERNAL FIXATION (ORIF) TIBIAL PLATEAU;  Surgeon: Corky Mull, MD;  Location: [facility] ORS;  Service: Orthopedics;  Laterality: Left;   PROSTATE SURGERY  2011   Family History  Problem Relation Age of Onset   Dementia Mother    Heart disease Father    Social History   Socioeconomic History   Marital status: Married    Spouse name: Not on file   Number of children: Not on file   Years of education: Not on file   Highest education level: Not on file  Occupational History   Not on file  Tobacco Use   Smoking status: Former    Packs/day: 1.00    Years: 50.00    Pack years: 50.00    Types: Cigarettes    Quit date: 02/08/2021    Years since quitting: 0.1   Smokeless tobacco: Never  Vaping Use   Vaping Use: Never used  Substance and Sexual Activity   Alcohol use: Yes    Comment: occasional beers   Drug use: No   Sexual activity: Not on file  Other Topics Concern   Not on file  Social History Narrative   Not on file   Social Determinants of Health   Financial Resource Strain: Not on file  Food Insecurity: Not on file  Transportation Needs: Not on file  Physical Activity: Not on file  Stress: Not on file  Social Connections: Not on file   No Known Allergies  Medications   Medications Prior to Admission  Medication Sig Dispense Refill Last Dose   amLODipine (NORVASC) 2.5 MG tablet Take 2.5 mg by mouth daily.   04/18/2021 at 0800   HYDROcodone-acetaminophen (NORCO/VICODIN) 5-325 MG tablet Take 1-2 tablets by mouth every 4 (four) hours as needed for moderate pain. 40 tablet 0 04/18/2021 at 2200   acetaminophen (TYLENOL) 325 MG tablet Take 1-2 tablets (325-650 mg total) by mouth  every 6 (six) hours as needed for mild pain (pain score 1-3 or temp > 100.5).   prn at unknown   fluticasone (FLONASE) 50 MCG/ACT nasal spray Place 2 sprays into both nostrils daily.  2 prn at prn   guaiFENesin-dextromethorphan (ROBITUSSIN DM) 100-10 MG/5ML syrup Take 5 mLs by mouth every 4 (four) hours as needed for cough. 118 mL 0 prn at prn   loratadine (CLARITIN) 10 MG tablet Take 1 tablet (10 mg total) by mouth daily.   prn at prn      Current Facility-Administered Medications:    acetaminophen (TYLENOL) tablet 325-650 mg, 325-650 mg, Oral, Q6H PRN, Imagene Sheller S, DO   amLODipine (NORVASC) tablet 2.5 mg, 2.5 mg, Oral, Daily, Anwar, Shayan S, DO, 2.5 mg at 04/20/21 0906   aspirin EC tablet 81 mg, 81 mg, Oral, Daily, Anwar, Shayan S, DO, 81 mg at 04/20/21 0906   enoxaparin (LOVENOX) injection 40 mg, 40 mg, Subcutaneous, Q24H, Imagene Sheller  S, DO   fluticasone (FLONASE) 50 MCG/ACT nasal spray 2 spray, 2 spray, Each Nare, Daily, Anwar, Shayan S, DO, 2 spray at 04/20/21 0554   guaiFENesin-dextromethorphan (ROBITUSSIN DM) 100-10 MG/5ML syrup 5 mL, 5 mL, Oral, Q4H PRN, Rowe Pavy, Shayan S, DO   loratadine (CLARITIN) tablet 10 mg, 10 mg, Oral, Daily, Anwar, Shayan S, DO, 10 mg at 04/20/21 0277   senna-docusate (Senokot-S) tablet 1 tablet, 1 tablet, Oral, QHS PRN, Rowe Pavy, Shayan S, DO   sodium chloride (OCEAN) 0.65 % nasal spray 1 spray, 1 spray, Each Nare, PRN, Imagene Sheller S, DO  Vitals   Vitals:   04/19/21 2111 04/20/21 0126 04/20/21 0549 04/20/21 0831  BP: 127/75 136/73 (!) 148/73 135/77  Pulse: 80 76 69 68  Resp: 16 18 16 16   Temp: 97.8 F (36.6 C) 97.8 F (36.6 C) 98 F (36.7 C) 97.8 F (36.6 C)  TempSrc: Oral Oral Oral Oral  SpO2: 96% 95% 95% 96%  Weight:      Height:         Body mass index is 20.89 kg/m.  Physical Exam   Physical Exam Gen: A&O x4, NAD HEENT: Atraumatic, normocephalic;mucous membranes moist; oropharynx clear, tongue without atrophy or  fasciculations. Neck: Supple, trachea midline. Resp: CTAB, no w/r/r CV: RRR, no m/g/r; nml S1 and S2. 2+ symmetric peripheral pulses. Abd: soft/NT/ND; nabs x 4 quad Extrem: Nml bulk; no cyanosis, clubbing, or edema.  Neuro: *MS: A&O x4. Follows multi-step commands.  *Speech: fluid, nondysarthric, able to name and repeat *CN:    I: Deferred   II,III: PERRLA, VFF by confrontation, optic discs unable to be visualized 2/2 pupillary constriction   III,IV,VI: EOMI w/o nystagmus, no ptosis   V: Sensation intact from V1 to V3 to LT   VII: Eyelid closure was full.  L lower facial droop (forehead spared)   VIII: Hearing intact to voice   IX,X: Voice normal, palate elevates symmetrically    XI: SCM/trap 5/5 bilat   XII: Tongue protrudes midline, no atrophy or fasciculations   *Motor:   Normal bulk.  No tremor, rigidity or bradykinesia. No pronator drift.    Strength: Dlt Bic Tri WrE WrF FgS Gr HF KnF KnE PlF DoF    Left 5 5 5 5 5 5 5 5 5 5 5 5     Right 5 5 5 5 5 5 5 5 5 5 5 5     *Sensory: Intact to light touch, pinprick, temperature vibration throughout. Symmetric. Propioception intact bilat.  No double-simultaneous extinction.  *Coordination:  Finger-to-nose, heel-to-shin, rapid alternating motions were intact. *Reflexes:  2+ and symmetric throughout without clonus; toes down-going bilat *Gait: deferred  NIHSS = 1 for facial droop  Premorbid mRS = 2 (walks w/ cane s/p hip surgery in Oct)   Labs   CBC:  Recent Labs  Lab 04/19/21 0857  WBC 9.2  NEUTROABS 5.6  HGB 12.9*  HCT 39.0  MCV 97.5  PLT 412    Basic Metabolic Panel:  Lab Results  Component Value Date   NA 135 04/19/2021   K 3.8 04/19/2021   CO2 29 04/19/2021   GLUCOSE 97 04/19/2021   BUN 11 04/19/2021   CREATININE 0.63 04/19/2021   CALCIUM 9.1 04/19/2021   GFRNONAA >60 04/19/2021   GFRAA >60 11/30/2015   Lipid Panel:  Lab Results  Component Value Date   LDLCALC 117 (H) 04/19/2021   HgbA1c: No results  found for: HGBA1C Urine Drug Screen: No results found for: LABOPIA, COCAINSCRNUR,  LABBENZ, AMPHETMU, THCU, LABBARB  Alcohol Level No results found for: ETH   Impression   75 yo gentleman with remote hx tobacco abuse and 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 Sx c/f TIA; MRI neg for acute infarct. TIA workup completed; patient may be discharged on medication regimen below (pt was not on aspirin PTA).  CTA H&N showed findings c/f severe cervical stenosis but patient is completely asymptomatic from this with no weakness on exam and no pain.   Recommendations   # Transient ischemic attack # Remote infarct L thalamocapsular junction # Secondary stroke prevention - ASA 81mg  daily - Atorvastatin 40mg  daily  # Cervical stenosis seen on CTA H&N this admission, severe - If patient develops neck pain or weakness in upper or lower extremities, PCP may place outpatient referral to neurology at that time ______________________________________________________________________   Thank you for the opportunity to take part in the care of this patient. If you have any further questions, please contact the neurology consultation attending.  Signed,  Su Monks, MD [facility] [phone]  If 7pm- 7am, please page neurology on call as listed in [place].

## 2021-04-20 NOTE — Discharge Summary (Signed)
Physician Discharge Summary   Patient: Jordan Barber MRN: [id number] DOB: @DOB   Admit date:     04/19/2021  Discharge date: 04/20/21  Discharge Physician: Max Sane   PCP: Albina Billet, MD   Recommendations at discharge: 1.  Follow-up with outpatient providers as requested  Discharge Diagnoses Principal Problem:   Transient ischemic attack Active Problems:   Facial droop  Hospital Course    75 y.o. 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  TIA Symptoms resolved.  CT angio head and neck showed severe cervical stenosis but patient is asymptomatic from this.  Seen by neurology and no further imaging or intervention needed -MRI negative for acute infarct.  Echo within normal limit -Continue aspirin 81 mg p.o. daily, atorvastatin 40 mg p.o. daily at discharge per neurology.  Patient in agreement with the same -Outpatient neurology follow-up with [facility] neuro  Severe cervical stenosis seen on CT angio head and neck Discussed with neurology.  No further imaging needed while inpatient. If patient develops neck pain/weakness in upper or lower extremities -outpatient providers can consider further imaging.  I recommend PCP and neurology follow-up at discharge   Pain control - [place] was reviewed. and patient was instructed, not to drive, operate heavy machinery, perform activities at heights, swimming or participation in water activities or provide baby-sitting services while on Pain, Sleep and Anxiety Medications; until their outpatient Physician has advised to do so again. Also recommended to not to take more than prescribed Pain, Sleep and Anxiety Medications.   Consultants: Neurology Disposition: Home Diet recommendation: Carb modified  diet  DISCHARGE MEDICATION: Allergies as of 04/20/2021   No Known Allergies      Medication List     TAKE these medications    acetaminophen 325 MG tablet Commonly known as: TYLENOL Take 1-2 tablets (325-650 mg total) by mouth every 6 (six) hours as needed for mild pain (pain score 1-3 or temp > 100.5).   amLODipine 2.5 MG tablet Commonly known as: NORVASC Take 2.5 mg by mouth daily.   aspirin 81 MG EC tablet Take 1 tablet (81 mg total) by mouth daily. Swallow whole. Start taking on: April 21, 2021   atorvastatin 40 MG tablet Commonly known as: LIPITOR Take 1 tablet (40 mg total) by mouth daily.   fluticasone 50 MCG/ACT nasal spray Commonly known as: FLONASE Place 2 sprays into both nostrils daily.   guaiFENesin-dextromethorphan 100-10 MG/5ML syrup Commonly known as: ROBITUSSIN DM Take 5 mLs by mouth every 4 (four) hours as needed for cough.   HYDROcodone-acetaminophen 5-325 MG tablet Commonly known as: NORCO/VICODIN Take 1-2 tablets by mouth every 4 (four) hours as needed for moderate pain.   loratadine 10 MG tablet Commonly known as: CLARITIN Take 1 tablet (10 mg total) by mouth daily.        Follow-up Information     Albina Billet, MD. Schedule an appointment as soon as possible for a visit in 1 week(s).   Specialty: Internal Medicine Why: [facility] Discharge F/UP Contact information: [place] [address] [place]   [place] [address] [phone]         Vladimir Crofts, MD. Schedule an appointment as soon as possible for a visit in 2 week(s).   Specialty: Neurology Why: [facility] Discharge F/UP Contact information: [address]  [facility] [place] [address] [phone]                 Discharge Exam: Danley Danker Weights   04/19/21 0858  Weight: 69.9 kg    General:  Appears calm and comfortable and is in NAD Cardiovascular:  RRR, no m/r/g.  Respiratory:   CTA bilaterally with no  wheezes/rales/rhonchi.  Normal respiratory effort. Abdomen:  soft, NT, ND, NABS Skin:  no rash or induration seen on limited exam Musculoskeletal:  grossly normal tone BUE/BLE, good ROM, no bony abnormality Lower extremity:  No LE edema.  Limited foot exam with no ulcerations.  2+ distal pulses. Psychiatric:  grossly normal mood and affect, speech fluent and appropriate, AOx3 Neurologic:  CN 2-12 grossly intact, moves all extremities in coordinated fashion, sensation intact, no focal neurological deficit noted  Condition at discharge: good  The results of significant diagnostics from this hospitalization (including imaging, microbiology, ancillary and laboratory) are listed below for reference.   Imaging Studies: CT HEAD WO CONTRAST  Result Date: 04/19/2021 CLINICAL DATA:  Facial droop EXAM: CT HEAD WITHOUT CONTRAST TECHNIQUE: Contiguous axial images were obtained from the base of the skull through the vertex without intravenous contrast. COMPARISON:  12/02/2017 FINDINGS: Brain: Mild age related volume loss and early chronic small vessel disease changes. No acute intracranial abnormality. Specifically, no hemorrhage, hydrocephalus, mass lesion, acute infarction, or significant intracranial injury. Vascular: No hyperdense vessel or unexpected calcification. Skull: No acute calvarial abnormality. Sinuses/Orbits: Extensive sinus disease. Air-fluid level in the right maxillary sinus. Complete opacification of left maxillary sinus, ethmoid air cells, and right frontal sinus. Mucosal thickening in the left frontal sinus and sphenoid sinuses. Findings have progressed since prior study. Other: None IMPRESSION: No acute intracranial abnormality. 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 Electronically Signed   By: Rolm Baptise M.D.   On: 04/19/2021 09:24   MR BRAIN WO CONTRAST  Result Date: 04/19/2021 CLINICAL DATA:  Provided history: Neuro deficit, acute, stroke suspected. Additional history provided:  Patient with a history of Ramsay Hunt syndrome, prostate cancer, hypertension, dyslipidemia presenting with slurred speech, left facial droop. EXAM: MRI HEAD WITHOUT CONTRAST TECHNIQUE: Multiplanar, multiecho pulse sequences of the brain and surrounding structures were obtained without intravenous contrast. COMPARISON:  Brain MRI 12/02/2017. CT angiogram head/neck 04/19/2021. FINDINGS: Brain: Mild intermittent motion degradation. Mild generalized cerebral atrophy. Moderate multifocal T2 FLAIR hyperintense signal abnormality within the cerebral white matter and pons, nonspecific but compatible with chronic small vessel ischemic disease. 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 Prominent perivascular space versus chronic lacunar infarct within the left caudate head, not definitively present on the prior MRI. There is no acute infarct. No evidence of an intracranial mass. No extra-axial fluid collection. No midline shift. Vascular: Maintained flow voids within the proximal large arterial vessels. Skull and upper cervical spine: No focal suspicious marrow lesion. Sinuses/Orbits: Visualized orbits show no acute finding. Complete T2 hyperintense opacification of the right frontal sinus. Mucosal thickening within the left frontal sinus, greatest inferiorly. Severe T2 hyperintense opacification of the bilateral ethmoid air cells. Moderate mucosal thickening within the bilateral sphenoid sinuses. Large fluid level, and background mild mucosal thickening, within the right maxillary sinus. Complete T2 hyperintense opacification of the left maxillary sinus. Other: Partially imaged polypoid soft tissue within the left nasal passage. Trace fluid within the right mastoid air cells. IMPRESSION: Mildly motion degraded exam. No evidence of acute intracranial abnormality. Moderate chronic small vessel ischemic changes within the cerebral white matter and pons,  progressed from the brain MRI of  12/02/2017. 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 Prominent perivascular space versus chronic lacunar infarct within the left caudate head, not definitively present on the prior MRI. Mild generalized cerebral atrophy. Paranasal sinus disease, as described. Correlate for acute on chronic sinusitis. Partially imaged polypoid soft tissue within the left nasal passage. Direct visualization recommended. Trace fluid within the right mastoid air cells. Electronically Signed   By: Kellie Simmering D.O.   On: 04/19/2021 14:38   ECHOCARDIOGRAM COMPLETE  Result Date: 04/20/2021    ECHOCARDIOGRAM REPORT   Patient Name:   Jordan Barber Glynn Date of Exam: 04/19/2021 Medical Rec #:  [id number]   Height:       72.0 in Accession #:    [id number]  Weight:       154.0 lb Date of Birth:  1945/05/16   BSA:          1.907 m Patient Age:    20 years    BP:           143/100 mmHg Patient Gender: M           HR:           80 bpm. Exam Location:  [facility] Procedure: 2D Echo Indications:     TIA G45.9  History:         Patient has no prior history of Echocardiogram examinations.  Sonographer:     Kathlen Brunswick Referring Phys:  [place] Diagnosing Phys: Yolonda Kida MD  Sonographer Comments: Technically difficult study due to poor echo windows and suboptimal apical window. Image acquisition challenging due to patient body habitus. IMPRESSIONS  1. Left ventricular ejection fraction, by estimation, is 55 to 60%. The left ventricle has normal function. The left ventricle has no regional wall motion abnormalities. Left ventricular diastolic parameters are consistent with Grade I diastolic dysfunction (impaired relaxation).  2. Right ventricular systolic function is normal. The right ventricular size is normal.  3. The mitral valve is normal in structure. No evidence of mitral valve regurgitation. No evidence of mitral stenosis.  4. The aortic valve is normal in structure. Aortic valve  regurgitation is not visualized. No aortic stenosis is present.  5. The inferior vena cava is normal in size with greater than 50% respiratory variability, suggesting right atrial pressure of 3 mmHg. FINDINGS  Left Ventricle: Left ventricular ejection fraction, by estimation, is 55 to 60%. The left ventricle has normal function. The left ventricle has no regional wall motion abnormalities. The left ventricular internal cavity size was normal in size. There is  no left ventricular hypertrophy. Left ventricular diastolic parameters are consistent with Grade I diastolic dysfunction (impaired relaxation). Right Ventricle: The right ventricular size is normal. No increase in right ventricular wall thickness. Right ventricular systolic function is normal. Left Atrium: Left atrial size was normal in size. Right Atrium: Right atrial size was normal in size. Pericardium: There is no evidence of pericardial effusion. Mitral Valve: The mitral valve is normal in structure. No evidence of mitral valve regurgitation. No evidence of mitral valve stenosis. Tricuspid Valve: The tricuspid valve is normal in structure. Tricuspid valve regurgitation is not demonstrated. No evidence of tricuspid stenosis. Aortic Valve: The aortic valve is normal in structure. Aortic valve regurgitation is not visualized. No aortic stenosis is present. Aortic valve peak gradient measures 4.8 mmHg. Pulmonic Valve: The pulmonic valve was normal in structure. Pulmonic valve regurgitation is not visualized. No evidence of pulmonic  stenosis. Aorta: The aortic root is normal in size and structure. Venous: The inferior vena cava is normal in size with greater than 50% respiratory variability, suggesting right atrial pressure of 3 mmHg. IAS/Shunts: No atrial level shunt detected by color flow Doppler.  LEFT VENTRICLE PLAX 2D LVIDd:         4.80 cm   Diastology LVIDs:         3.40 cm   LV e' medial:    6.31 cm/s LV PW:         1.00 cm   LV E/e' medial:  10.1 LV  IVS:        1.10 cm   LV e' lateral:   12.40 cm/s LVOT diam:     2.30 cm   LV E/e' lateral: 5.1 LV SV:         65 LV SV Index:   34 LVOT Area:     4.15 cm  RIGHT VENTRICLE RV Basal diam:  2.50 cm TAPSE (M-mode): 2.3 cm LEFT ATRIUM           Index       RIGHT ATRIUM          Index LA diam:      2.20 cm 1.15 cm/m  RA Area:     7.44 cm LA Vol (A4C): 15.3 ml 8.02 ml/m  RA Volume:   12.80 ml 6.71 ml/m  AORTIC VALVE                 PULMONIC VALVE AV Area (Vmax): 3.21 cm     PV Vmax:       0.87 m/s AV Vmax:        109.00 cm/s  PV Peak grad:  3.0 mmHg AV Peak Grad:   4.8 mmHg LVOT Vmax:      84.20 cm/s LVOT Vmean:     57.900 cm/s LVOT VTI:       0.157 m  AORTA Ao Root diam: 4.60 cm Ao Asc diam:  3.40 cm MITRAL VALVE               TRICUSPID VALVE MV Area (PHT): 5.02 cm    TV Peak grad:   34.8 mmHg MV Decel Time: 151 msec    TV Vmax:        2.95 m/s MV E velocity: 63.80 cm/s MV A velocity: 90.80 cm/s  SHUNTS MV E/A ratio:  0.70        Systemic VTI:  0.16 m                            Systemic Diam: 2.30 cm Yolonda Kida MD Electronically signed by Yolonda Kida MD Signature Date/Time: 04/20/2021/11:17:22 AM    Final    CT ANGIO HEAD NECK W WO CM (CODE STROKE)  Result Date: 04/19/2021 CLINICAL DATA:  Provided history: Transient ischemic attack. Left-sided facial droop. EXAM: CT ANGIOGRAPHY HEAD AND NECK TECHNIQUE: Multidetector CT imaging of the head and neck was performed using the standard protocol during bolus administration of intravenous contrast. Multiplanar CT image reconstructions and MIPs were obtained to evaluate the vascular anatomy. Carotid stenosis measurements (when applicable) are obtained utilizing NASCET criteria, using the distal internal carotid diameter as the denominator. CONTRAST:  52mL OMNIPAQUE IOHEXOL 350 MG/ML SOLN COMPARISON:  Noncontrast head CT performed earlier today 04/19/2021. FINDINGS: CTA NECK FINDINGS Aortic arch: Standard aortic branching. Atherosclerotic plaque within the  visualized aortic arch and proximal  major branch vessels of the neck. Atherosclerotic narrowing of the proximal right subclavian artery of up to 50%. No hemodynamically significant stenosis within the innominate or proximal left subclavian arteries. Right carotid system: CCA and ICA patent within the neck. Soft calcified plaque about the carotid bifurcation and within the proximal ICA, resulting in up to 30-40% stenosis of the proximal ICA. Mild nonstenotic calcified plaque is also present more distally within the cervical ICA. Tortuosity of the mid cervical ICA. Left carotid system: CCA and ICA patent within the neck. Soft and calcified plaque about the carotid bifurcation and within the proximal ICA. Stenosis of the proximal ICA of up to 50%. Tortuosity of the mid cervical ICA. Vertebral arteries: Vertebral arteries patent within the neck. There are sites of up to 50% stenosis within the V2 vertebral arteries, bilaterally, due to mass effect from adjacent degenerative osseous spurring. Skeleton: Reversal of the expected cervical lordosis. C2-C3 and C3-C4 grade 1 anterolisthesis. C5-C6 grade 1 retrolisthesis. C7-T1 grade 1 anterolisthesis cervical spondylosis with multilevel disc space narrowing, disc bulges, posterior disc osteophytes, endplate spurring, uncovertebral hypertrophy and facet arthrosis. Disc space narrowing is advanced at C4-C5, C5-C6 and C6-C7. Multilevel spinal canal stenosis. Most notably, there is severe spinal canal stenosis at C4-C5, C5-C6 and C6-C7. Multilevel bony neural foraminal narrowing. Other neck: No neck mass or cervical lymphadenopathy. Upper chest: Centrilobular emphysema. No consolidation within the imaged lung apices. Review of the MIP images confirms the above findings CTA HEAD FINDINGS Anterior circulation: The intracranial internal carotid arteries are patent. Nonstenotic calcified plaque within both vessels. The M1 middle cerebral arteries are patent. No M2 proximal branch  occlusion or high-grade proximal stenosis is identified. The anterior cerebral arteries are patent. Tiny 1 mm inferiorly directed vascular protrusion arising from the paraclinoid left ICA, which may reflect a tiny aneurysm (series 8, image 152). Posterior circulation: The intracranial vertebral arteries are patent. The basilar artery is patent. The posterior cerebral arteries are patent. Atherosclerotic irregularity of both vessels. Multiple medically moderate stenosis within the right PCA at the P2/P3 junction. Posterior communicating arteries are diminutive or absent bilaterally. Venous sinuses: Within the limitations of contrast timing, no convincing thrombus. Anatomic variants: As described. Review of the MIP images confirms the above findings IMPRESSION: CTA neck: 1. The common carotid and internal carotid arteries are patent within the neck. Atherosclerotic narrowing of the proximal right ICA of up to 30-40%. Atherosclerotic narrowing of the proximal left ICA of up to 50%. 2. Vertebral arteries patent within the neck. Sites of up to 50% stenosis within the V2 vertebral arteries bilaterally due to mass effect from degenerative osseous spurring within the cervical spine. 3. Advanced cervical spondylosis. Notably, there is multifactorial severe spinal canal stenosis at C4-C5, C5-C6 and C6-C7. Multilevel bony neural foraminal narrowing. Reversal of the expected cervical lordosis. Mild multilevel grade 1 spondylolisthesis, as detailed. 4. Aortic Atherosclerosis (ICD10-I70.0) and Emphysema (ICD10-J43.9). CTA head: 1. No intracranial large vessel occlusion or proximal high-grade arterial stenosis. 2. Nonstenotic calcified plaque within the intracranial internal carotid arteries. 3. Atherosclerotic irregularity of the posterior cerebral arteries. Most notably, there is a site of moderate stenosis within the right PCA at the P2/P3 junction. Electronically Signed   By: Kellie Simmering D.O.   On: 04/19/2021 13:01     Microbiology: Results for orders placed or performed during the hospital encounter of 02/08/21  Resp Panel by RT-PCR (Flu A&B, Covid) Nasopharyngeal Swab     Status: None   Collection Time: 02/08/21 10:09 PM   Specimen:  Nasopharyngeal Swab; Nasopharyngeal(NP) swabs in vial transport medium  Result Value Ref [place] Status   SARS Coronavirus 2 by RT PCR NEGATIVE NEGATIVE Final    Comment: (NOTE) SARS-CoV-2 target nucleic acids are NOT DETECTED.  The SARS-CoV-2 RNA is generally detectable in upper respiratory specimens during the acute phase of infection. The lowest concentration of SARS-CoV-2 viral copies this assay can detect is 138 copies/mL. A negative result does not preclude SARS-Cov-2 infection and should not be used as the sole basis for treatment or other patient management decisions. A negative result may occur with  improper specimen collection/handling, submission of specimen other than nasopharyngeal swab, presence of viral mutation(s) within the areas targeted by this assay, and inadequate number of viral copies(<138 copies/mL). A negative result must be combined with clinical observations, patient history, and epidemiological information. The expected result is Negative.  Fact Sheet for Patients:  EntrepreneurPulse.com.au  Fact Sheet for Healthcare Providers:  IncredibleEmployment.be  This test is no t yet approved or cleared by the [place] FDA and  has been authorized for detection and/or diagnosis of SARS-CoV-2 by FDA under an Emergency Use Authorization (EUA). This EUA will remain  in effect (meaning this test can be used) for the duration of the COVID-19 declaration under Section 564(b)(1) of the Act, 21 U.S.C.section 360bbb-3(b)(1), unless the authorization is terminated  or revoked sooner.       Influenza A by PCR NEGATIVE NEGATIVE Final   Influenza B by PCR NEGATIVE NEGATIVE Final    Comment: (NOTE) The Xpert  Xpress SARS-CoV-2/FLU/RSV plus assay is intended as an aid in the diagnosis of influenza from Nasopharyngeal swab specimens and should not be used as a sole basis for treatment. Nasal washings and aspirates are unacceptable for Xpert Xpress SARS-CoV-2/FLU/RSV testing.  Fact Sheet for Patients: EntrepreneurPulse.com.au  Fact Sheet for Healthcare Providers: IncredibleEmployment.be  This test is not yet approved or cleared by the [place] FDA and has been authorized for detection and/or diagnosis of SARS-CoV-2 by FDA under an Emergency Use Authorization (EUA). This EUA will remain in effect (meaning this test can be used) for the duration of the COVID-19 declaration under Section 564(b)(1) of the Act, 21 U.S.C. section 360bbb-3(b)(1), unless the authorization is terminated or revoked.  Performed at [facility], [address] [place], [place] [address]   Surgical PCR screen     Status: None   Collection Time: 02/09/21  1:04 AM   Specimen: Nasal Mucosa; Nasal Swab  Result Value Ref [place] Status   MRSA, PCR NEGATIVE NEGATIVE Final   Staphylococcus aureus NEGATIVE NEGATIVE Final    Comment: (NOTE) The Xpert SA Assay (FDA approved for NASAL specimens in patients 30 years of age and older), is one component of a comprehensive surveillance program. It is not intended to diagnose infection nor to guide or monitor treatment. Performed at [facility], [address] [place], [place] [address]   Resp Panel by RT-PCR (Flu A&B, Covid) Nasopharyngeal Swab     Status: None   Collection Time: 02/13/21  2:38 PM   Specimen: Nasopharyngeal Swab; Nasopharyngeal(NP) swabs in vial transport medium  Result Value Ref [place] Status   SARS Coronavirus 2 by RT PCR NEGATIVE NEGATIVE Final    Comment: (NOTE) SARS-CoV-2 target nucleic acids are NOT DETECTED.  The SARS-CoV-2 RNA is generally detectable in upper respiratory specimens during  the acute phase of infection. The lowest concentration of SARS-CoV-2 viral copies this assay can detect is 138 copies/mL. A negative result does not  preclude SARS-Cov-2 infection and should not be used as the sole basis for treatment or other patient management decisions. A negative result may occur with  improper specimen collection/handling, submission of specimen other than nasopharyngeal swab, presence of viral mutation(s) within the areas targeted by this assay, and inadequate number of viral copies(<138 copies/mL). A negative result must be combined with clinical observations, patient history, and epidemiological information. The expected result is Negative.  Fact Sheet for Patients:  EntrepreneurPulse.com.au  Fact Sheet for Healthcare Providers:  IncredibleEmployment.be  This test is no t yet approved or cleared by the [place] FDA and  has been authorized for detection and/or diagnosis of SARS-CoV-2 by FDA under an Emergency Use Authorization (EUA). This EUA will remain  in effect (meaning this test can be used) for the duration of the COVID-19 declaration under Section 564(b)(1) of the Act, 21 U.S.C.section 360bbb-3(b)(1), unless the authorization is terminated  or revoked sooner.       Influenza A by PCR NEGATIVE NEGATIVE Final   Influenza B by PCR NEGATIVE NEGATIVE Final    Comment: (NOTE) The Xpert Xpress SARS-CoV-2/FLU/RSV plus assay is intended as an aid in the diagnosis of influenza from Nasopharyngeal swab specimens and should not be used as a sole basis for treatment. Nasal washings and aspirates are unacceptable for Xpert Xpress SARS-CoV-2/FLU/RSV testing.  Fact Sheet for Patients: EntrepreneurPulse.com.au  Fact Sheet for Healthcare Providers: IncredibleEmployment.be  This test is not yet approved or cleared by the [place] FDA and has been authorized for detection and/or  diagnosis of SARS-CoV-2 by FDA under an Emergency Use Authorization (EUA). This EUA will remain in effect (meaning this test can be used) for the duration of the COVID-19 declaration under Section 564(b)(1) of the Act, 21 U.S.C. section 360bbb-3(b)(1), unless the authorization is terminated or revoked.  Performed at [facility], [address] [place],  [address]     Labs: CBC: Recent Labs  Lab 04/19/21 0857  WBC 9.2  NEUTROABS 5.6  HGB 12.9*  HCT 39.0  MCV 97.5  PLT 101   Basic Metabolic Panel: Recent Labs  Lab 04/19/21 0857  NA 135  K 3.8  CL 100  CO2 29  GLUCOSE 97  BUN 11  CREATININE 0.63  CALCIUM 9.1   Liver Function Tests: Recent Labs  Lab 04/19/21 0857  AST 22  ALT 15  ALKPHOS 84  BILITOT 0.9  PROT 6.9  ALBUMIN 3.6   CBG: Recent Labs  Lab 04/19/21 0851  GLUCAP 125*    Discharge time spent: greater than 30 minutes.  Signed:  Max Sane MD.  [facility] 04/20/2021

## 2021-04-22 LAB — HEMOGLOBIN A1C
Hgb A1c MFr Bld: 5.4 % (ref 4.8–5.6)
Mean Plasma Glucose: 108 mg/dL

## 2021-05-26 ENCOUNTER — Other Ambulatory Visit: Payer: Self-pay

## 2021-05-26 DIAGNOSIS — Z87891 Personal history of nicotine dependence: Secondary | ICD-10-CM

## 2021-06-16 ENCOUNTER — Other Ambulatory Visit: Payer: Self-pay

## 2021-06-16 ENCOUNTER — Ambulatory Visit
Admission: RE | Admit: 2021-06-16 | Discharge: 2021-06-16 | Disposition: A | Discharge status: Home / Self Care | Payer: Medicare Other | Source: Ambulatory Visit | Attending: Acute Care | Admitting: Acute Care

## 2021-06-16 DIAGNOSIS — Z87891 Personal history of nicotine dependence: Secondary | ICD-10-CM | POA: Insufficient documentation

## 2021-06-20 ENCOUNTER — Other Ambulatory Visit: Payer: Self-pay

## 2021-06-20 DIAGNOSIS — Z87891 Personal history of nicotine dependence: Secondary | ICD-10-CM

## 2022-03-25 LAB — LAB REPORT - SCANNED: EGFR: 90

## 2022-06-16 ENCOUNTER — Ambulatory Visit
Admission: RE | Admit: 2022-06-16 | Discharge: 2022-06-16 | Disposition: A | Discharge status: Home / Self Care | Payer: Medicare Other | Source: Ambulatory Visit | Attending: Internal Medicine | Admitting: Internal Medicine

## 2022-06-16 DIAGNOSIS — Z122 Encounter for screening for malignant neoplasm of respiratory organs: Secondary | ICD-10-CM | POA: Diagnosis present

## 2022-06-16 DIAGNOSIS — Z87891 Personal history of nicotine dependence: Secondary | ICD-10-CM

## 2022-06-16 DIAGNOSIS — I2510 Atherosclerotic heart disease of native coronary artery without angina pectoris: Secondary | ICD-10-CM | POA: Diagnosis not present

## 2022-06-16 DIAGNOSIS — J432 Centrilobular emphysema: Secondary | ICD-10-CM | POA: Diagnosis not present

## 2022-06-16 DIAGNOSIS — I700 Atherosclerosis of aorta: Secondary | ICD-10-CM | POA: Insufficient documentation

## 2022-06-17 ENCOUNTER — Other Ambulatory Visit: Payer: Self-pay | Admitting: Acute Care

## 2022-06-17 DIAGNOSIS — Z87891 Personal history of nicotine dependence: Secondary | ICD-10-CM

## 2022-06-17 DIAGNOSIS — Z122 Encounter for screening for malignant neoplasm of respiratory organs: Secondary | ICD-10-CM

## 2022-07-02 IMAGING — CT CT CHEST LUNG CANCER SCREENING LOW DOSE W/O CM
2 of 5 series · 15 of 40 positions shown, 18 images · non-contrast
Comparison: 05/13/2020

CLINICAL DATA: 75-year-old male with 52 pack-year history of
smoking. Lung cancer screening.



[Series 3: lung 1.00 · axial · 0.65mm/px · z∈[-1213,-874]mm · 12 of 375 slices shown, 15 images]
[im 18/375  mediastinal]
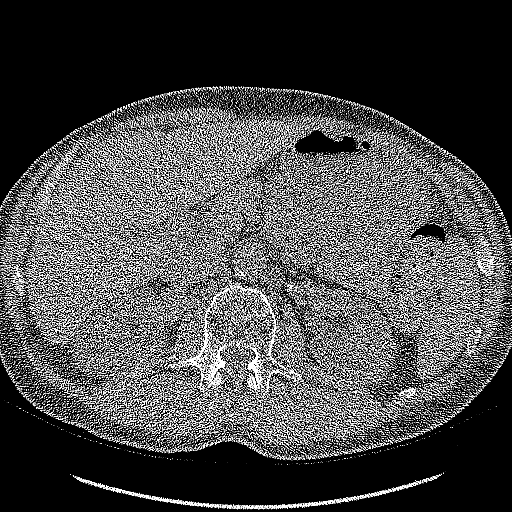
[im 18/375  lung]
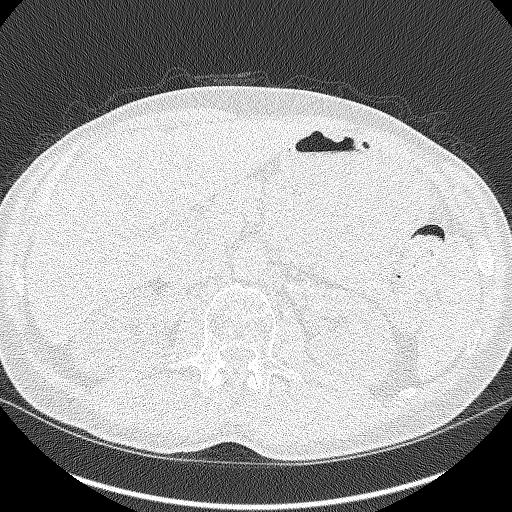
[im 54/375  lung]
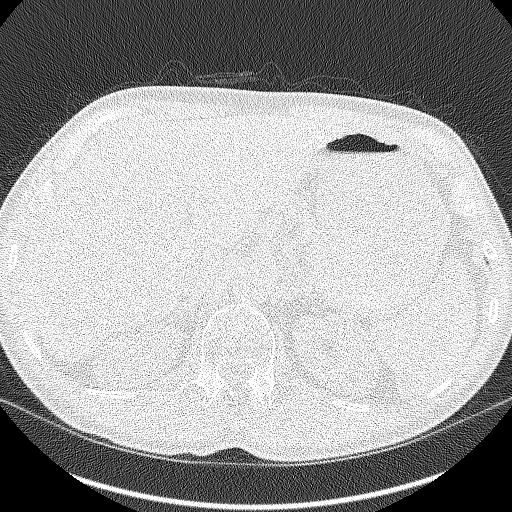
[im 90/375  lung]
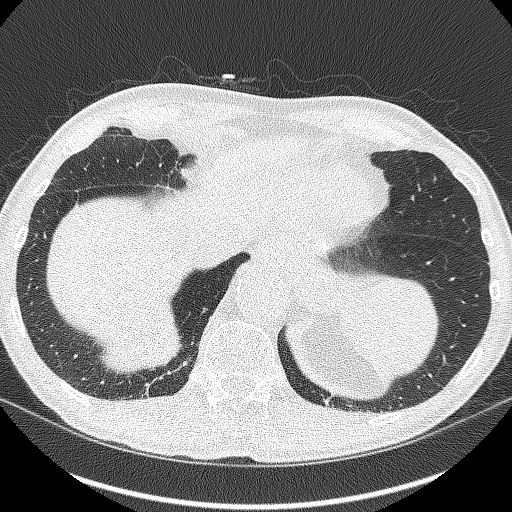
[im 107/375  lung]
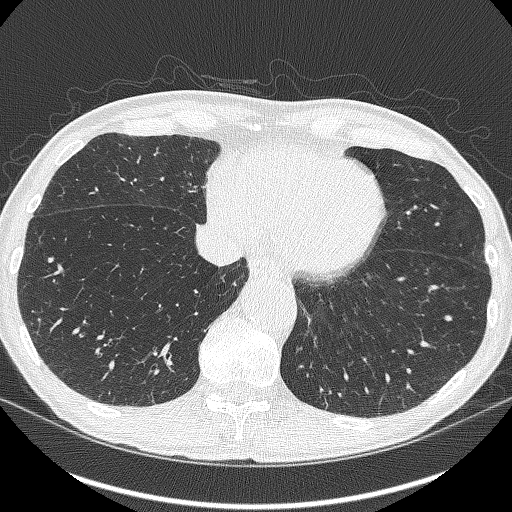
[im 143/375  mediastinal]
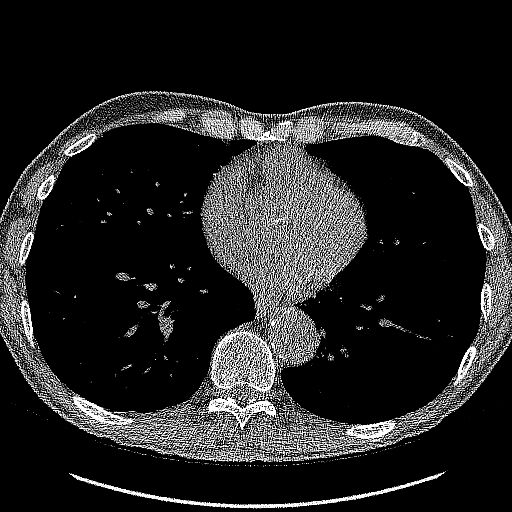
[im 143/375  lung]
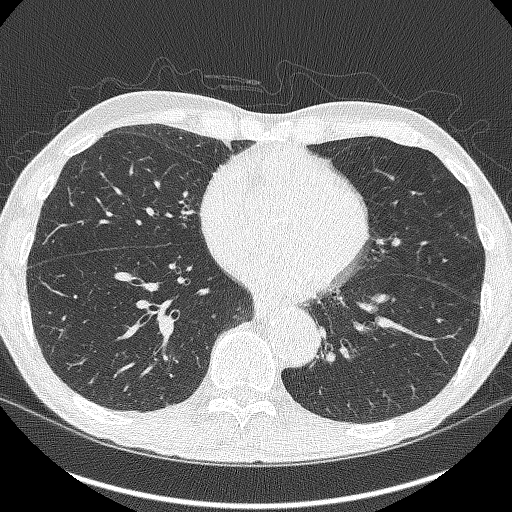
[im 179/375  lung]
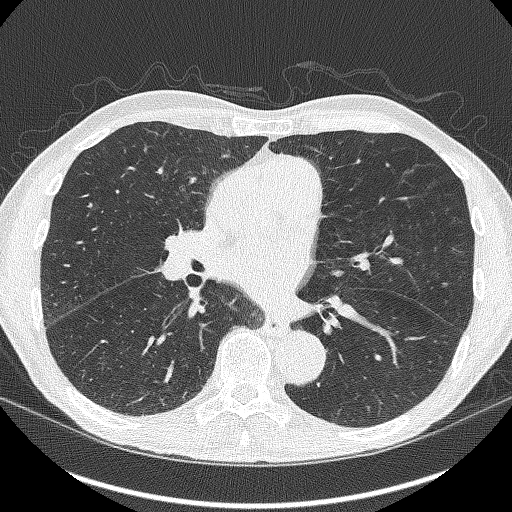
[im 196/375  lung]
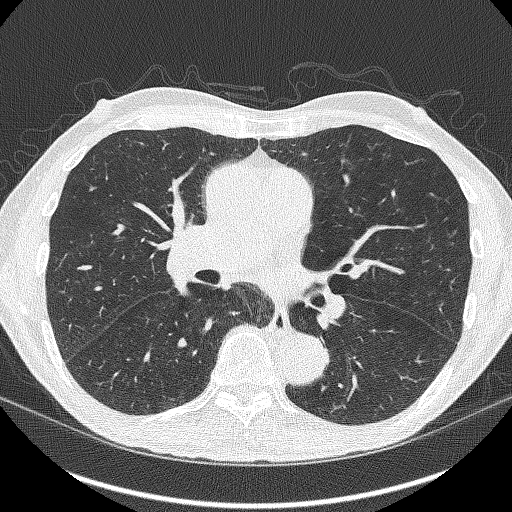
[im 232/375  lung]
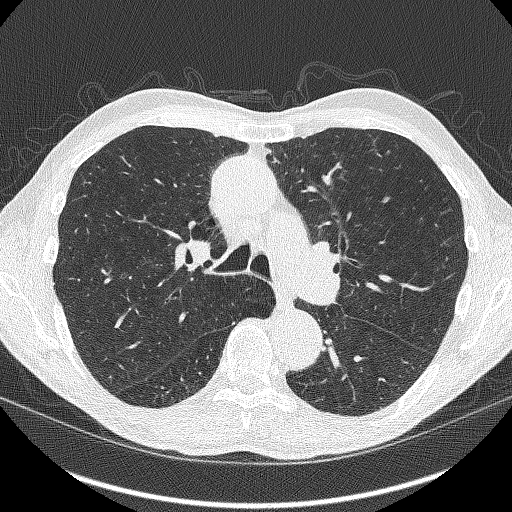
[im 268/375  mediastinal]
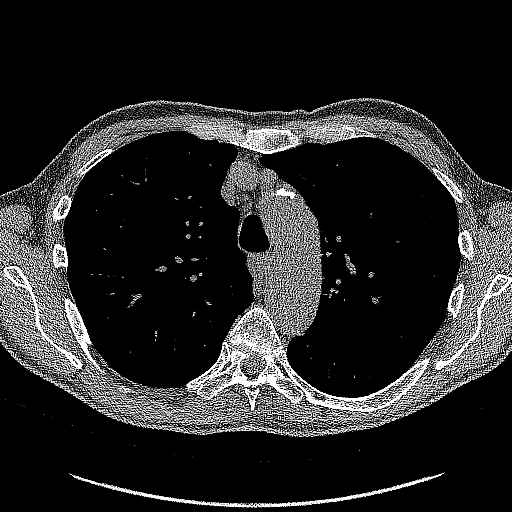
[im 268/375  lung]
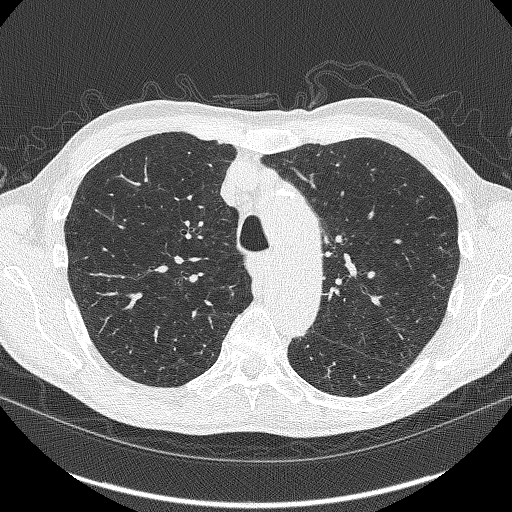
[im 285/375  lung]
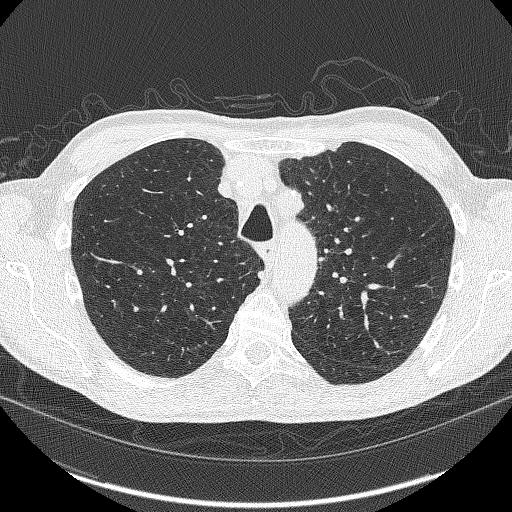
[im 321/375  lung]
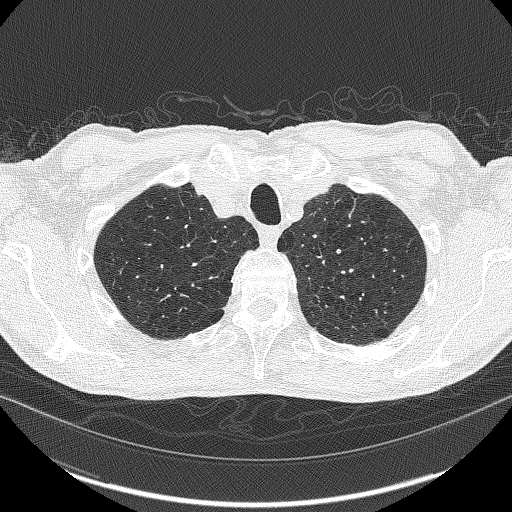
[im 357/375  lung]
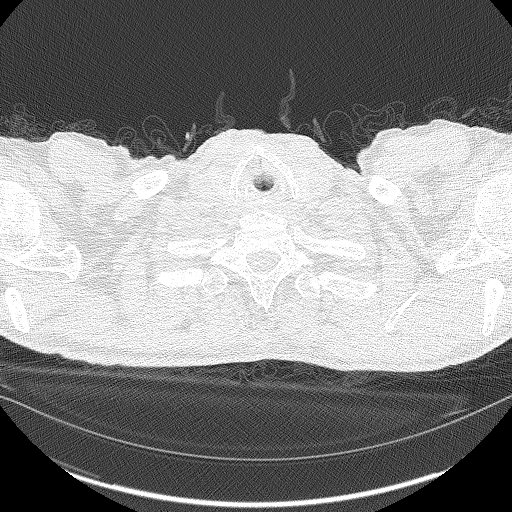

[Series 5: coronals lung 1.00 cor · coronal · 0.65mm/px · 3 of 332 slices shown]
[im 67/332  lung]
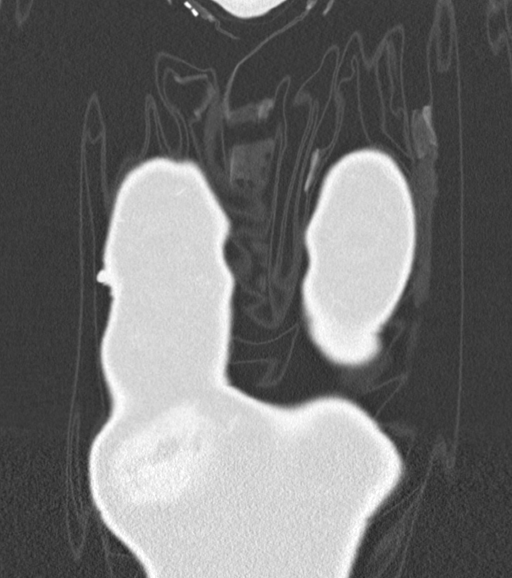
[im 133/332  lung]
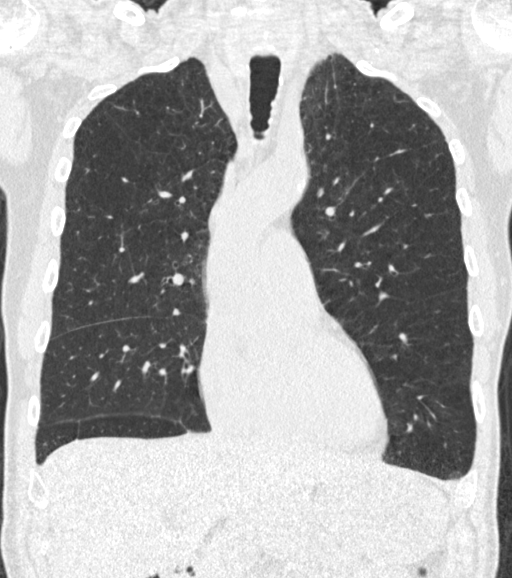
[im 199/332  lung]
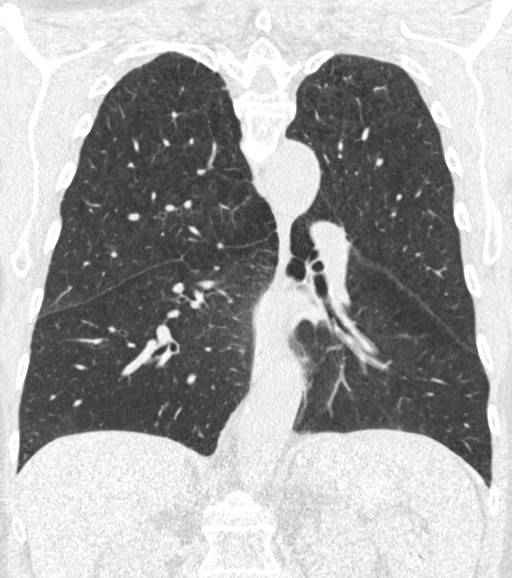

[15 of 40 positions shown; findings below may reference images not displayed]

FINDINGS: Cardiovascular: The heart size is normal. No substantial pericardial
effusion. Mild atherosclerotic calcification is noted in the wall of
the thoracic aorta.

Mediastinum/Nodes: No mediastinal lymphadenopathy. No evidence for
gross hilar lymphadenopathy although assessment is limited by the
lack of intravenous contrast on the current study. The esophagus has
normal imaging features. There is no axillary lymphadenopathy.

Lungs/Pleura: Centrilobular and paraseptal emphysema evident.
Scattered tiny calcified and noncalcified pulmonary nodules
identified previously are stable in the interval. No new suspicious
pulmonary nodule or mass. No focal airspace consolidation. No
pleural effusion.

Upper Abdomen: Unremarkable.

Musculoskeletal: No worrisome lytic or sclerotic osseous
abnormality.
IMPRESSION: 1. Lung-RADS 2, benign appearance or behavior. Continue annual
screening with low-dose chest CT without contrast in 12 months.
2.  Emphysema ([id number]) and Aortic Atherosclerosis (EPUGV-170.0)

## 2022-11-28 LAB — LAB REPORT - SCANNED

## 2023-03-30 LAB — LAB REPORT - SCANNED: EGFR: 93

## 2023-04-22 ENCOUNTER — Other Ambulatory Visit: Payer: Self-pay | Admitting: Acute Care

## 2023-04-22 DIAGNOSIS — Z87891 Personal history of nicotine dependence: Secondary | ICD-10-CM

## 2023-04-22 DIAGNOSIS — Z122 Encounter for screening for malignant neoplasm of respiratory organs: Secondary | ICD-10-CM

## 2023-06-18 ENCOUNTER — Ambulatory Visit: Payer: Medicare Other

## 2023-06-18 ENCOUNTER — Ambulatory Visit
Admission: RE | Admit: 2023-06-18 | Discharge: 2023-06-18 | Disposition: A | Discharge status: Home / Self Care | Payer: Medicare Other | Source: Ambulatory Visit | Attending: Acute Care | Admitting: Acute Care

## 2023-06-18 DIAGNOSIS — Z87891 Personal history of nicotine dependence: Secondary | ICD-10-CM | POA: Insufficient documentation

## 2023-06-18 DIAGNOSIS — Z122 Encounter for screening for malignant neoplasm of respiratory organs: Secondary | ICD-10-CM | POA: Insufficient documentation

## 2023-07-09 ENCOUNTER — Other Ambulatory Visit: Payer: Self-pay

## 2023-07-09 DIAGNOSIS — Z122 Encounter for screening for malignant neoplasm of respiratory organs: Secondary | ICD-10-CM

## 2023-07-09 DIAGNOSIS — Z87891 Personal history of nicotine dependence: Secondary | ICD-10-CM

## 2023-11-22 ENCOUNTER — Other Ambulatory Visit: Payer: Self-pay

## 2023-11-22 ENCOUNTER — Ambulatory Visit: Payer: Self-pay

## 2023-11-22 ENCOUNTER — Emergency Department

## 2023-11-22 ENCOUNTER — Emergency Department
Admission: EM | Admit: 2023-11-22 | Discharge: 2023-11-22 | Disposition: A | Discharge status: Home / Self Care | Attending: Emergency Medicine | Admitting: Emergency Medicine

## 2023-11-22 DIAGNOSIS — K439 Ventral hernia without obstruction or gangrene: Secondary | ICD-10-CM | POA: Diagnosis not present

## 2023-11-22 DIAGNOSIS — K4090 Unilateral inguinal hernia, without obstruction or gangrene, not specified as recurrent: Secondary | ICD-10-CM | POA: Diagnosis not present

## 2023-11-22 DIAGNOSIS — Z8546 Personal history of malignant neoplasm of prostate: Secondary | ICD-10-CM | POA: Insufficient documentation

## 2023-11-22 DIAGNOSIS — R935 Abnormal findings on diagnostic imaging of other abdominal regions, including retroperitoneum: Secondary | ICD-10-CM | POA: Diagnosis not present

## 2023-11-22 DIAGNOSIS — R1032 Left lower quadrant pain: Secondary | ICD-10-CM | POA: Diagnosis present

## 2023-11-22 LAB — CBC WITH DIFFERENTIAL/PLATELET
Abs Immature Granulocytes: 0.05 K/uL (ref 0.00–0.07)
Basophils Absolute: 0.1 K/uL (ref 0.0–0.1)
Basophils Relative: 1 %
Eosinophils Absolute: 0.2 K/uL (ref 0.0–0.5)
Eosinophils Relative: 3 %
HCT: 41.5 % (ref 39.0–52.0)
Hemoglobin: 14.2 g/dL (ref 13.0–17.0)
Immature Granulocytes: 1 %
Lymphocytes Relative: 23 %
Lymphs Abs: 2 K/uL (ref 0.7–4.0)
MCH: 33.3 pg (ref 26.0–34.0)
MCHC: 34.2 g/dL (ref 30.0–36.0)
MCV: 97.2 fL (ref 80.0–100.0)
Monocytes Absolute: 0.8 K/uL (ref 0.1–1.0)
Monocytes Relative: 10 %
Neutro Abs: 5.6 K/uL (ref 1.7–7.7)
Neutrophils Relative %: 62 %
Platelets: 195 K/uL (ref 150–400)
RBC: 4.27 MIL/uL (ref 4.22–5.81)
RDW: 13.2 % (ref 11.5–15.5)
WBC: 8.8 K/uL (ref 4.0–10.5)
nRBC: 0 % (ref 0.0–0.2)

## 2023-11-22 LAB — COMPREHENSIVE METABOLIC PANEL WITH GFR
ALT: 20 U/L (ref 0–44)
AST: 31 U/L (ref 15–41)
Albumin: 3.9 g/dL (ref 3.5–5.0)
Alkaline Phosphatase: 59 U/L (ref 38–126)
Anion gap: 7 (ref 5–15)
BUN: 13 mg/dL (ref 8–23)
CO2: 21 mmol/L — ABNORMAL LOW (ref 22–32)
Calcium: 9 mg/dL (ref 8.9–10.3)
Chloride: 104 mmol/L (ref 98–111)
Creatinine, Ser: 0.78 mg/dL (ref 0.61–1.24)
GFR, Estimated: >60 mL/min (ref >60)
Glucose, Bld: 102 mg/dL — ABNORMAL HIGH (ref 70–99)
Potassium: 3.9 mmol/L (ref 3.5–5.1)
Sodium: 132 mmol/L — ABNORMAL LOW (ref 135–145)
Total Bilirubin: 1.5 mg/dL — ABNORMAL HIGH (ref 0.0–1.2)
Total Protein: 6.8 g/dL (ref 6.5–8.1)

## 2023-11-22 MED ORDER — IOHEXOL 300 MG/ML  SOLN
100.0000 mL | Freq: Once | INTRAMUSCULAR | Status: AC | PRN
  Administered 2023-11-22: 100 mL via INTRAVENOUS

## 2023-11-22 NOTE — ED Notes (Signed)
 Pt provided discharge instructions and prescription information. Pt was given the opportunity to ask questions and questions were answered.

## 2023-11-22 NOTE — ED Triage Notes (Signed)
 Pt states pain to L groin area, pt denies injury. Pt denies HX of kidney stones. Pt states pain worse if he coughs or sneezes, pt denies HX of hernias at this time. NAD noted.

## 2023-11-22 NOTE — Telephone Encounter (Signed)
  FYI Only or Action Required?: FYI only for provider.  Patient was last seen in primary care on N/A.  Called Nurse Triage reporting Groin Pain.  Symptoms began Prior to Triage.  Interventions attempted: Nothing.  Symptoms are: gradually worsening.  Triage Disposition: Go to ED Now (Notify PCP)  Patient/caregiver understands and will follow disposition?: Yes                     Copied from CRM [id number]. Topic: Clinical - Red Word Triage >> Nov 22, 2023  9:45 AM Elle L wrote: Red Word that prompted transfer to Nurse Triage: The patient thinks he has a hernia and he is unsure if he should go to the Emergency Room. It is painful when he sneezes or coughs and there is a burning sensation. Reason for Disposition  [1] SEVERE pain AND [2] age > 60 years  Answer Assessment - Initial Assessment Questions Wife states that there is an obvious lump in his groin area as well Pain becomes severe 9 out of 10 when patient coughs or sneezes Constant 2 out of 10 pain burning sensation Patient advised that with these factors the Emergency Room is recommended Patient has no further questions and is advised to call back if he does have any Patient's wife is with him at this time and they are going to go to the Emergency Room at this time     1. LOCATION: Where does it hurt?      Groin area 2. RADIATION: Does the pain shoot anywhere else? (e.g., chest, back)     Groin area 3. ONSET: When did the pain begin? (Minutes, hours or days ago)      ------ 4. SUDDEN: Gradual or sudden onset?     ---- 5. PATTERN Does the pain come and go, or is it constant?     Constant burning pain 2 out of 10 but if he coughs or sneezes the pain becomes a 9 out of 10 6. SEVERITY: How bad is the pain?  (e.g., Scale 1-10; mild, moderate, or severe)     Constant burning pain 2 out of 10 but if he coughs or sneezes the pain becomes a 9 out of 10 7. RECURRENT SYMPTOM: Have you ever had this  type of stomach pain before? If Yes, ask: When was the last time? and What happened that time?      Patient states he might have felt something similar many years ago when passing a kidney stone 8. CAUSE: What do you think is causing the stomach pain? (e.g., gallstones, recent abdominal surgery)     unknown 9. RELIEVING/AGGRAVATING FACTORS: What makes it better or worse? (e.g., antacids, bending or twisting motion, bowel movement)     ----- 10. OTHER SYMPTOMS: Do you have any other symptoms? (e.g., back pain, diarrhea, fever, urination pain, vomiting)       -----  Protocols used: Abdominal Pain - Male-A-AH

## 2023-11-22 NOTE — ED Provider Notes (Signed)
 [facility] Provider Note    Event Date/Time   First MD Initiated Contact with Patient 11/22/23 1105     (approximate)   History   Groin Pain   HPI  Jordan Barber is a 78 y.o. male history of prostate cancer, Ramsay Hunt syndrome, fall presents emergency department with left inguinal pain.  Patient states he coughed really hard and had sharp severe pain in the left side of the inguinal area.  States now he has swelling in that area and is painful.  Thinks he has a hernia but not sure.      Physical Exam   Triage Vital Signs: ED Triage Vitals  Encounter Vitals Group     BP 11/22/23 1032 (!) 159/83     Girls Systolic BP Percentile --      Girls Diastolic BP Percentile --      Boys Systolic BP Percentile --      Boys Diastolic BP Percentile --      Pulse Rate 11/22/23 1032 70     Resp 11/22/23 1032 18     Temp 11/22/23 1032 97.8 F (36.6 C)     Temp Source 11/22/23 1032 Oral     SpO2 11/22/23 1032 95 %     Weight 11/22/23 1033 160 lb 0.9 oz (72.6 kg)     Height 11/22/23 1033 6' (1.829 m)     Head Circumference --      Peak Flow --      Pain Score 11/22/23 1032 3     Pain Loc --      Pain Education --      Exclude from Growth Chart --     Most recent vital signs: Vitals:   11/22/23 1032  BP: (!) 159/83  Pulse: 70  Resp: 18  Temp: 97.8 F (36.6 C)  SpO2: 95%     General: Awake, no distress.   CV:  Good peripheral perfusion.  Resp:  Normal effort.  Abd:  No distention.  Nontender in the left lower quadrant, inguinal canal extremely tender, swelling noted at the suprapubic area Other:      ED Results / Procedures / Treatments   Labs (all labs ordered are listed, but only abnormal results are displayed) Labs Reviewed  COMPREHENSIVE METABOLIC PANEL WITH GFR - Abnormal; Notable for the following components:      Result Value   Sodium 132 (*)    CO2 21 (*)    Glucose, Bld 102 (*)    Total Bilirubin 1.5 (*)    All other  components within normal limits  CBC WITH DIFFERENTIAL/PLATELET     EKG     RADIOLOGY CT abdomen pelvis IV contrast    PROCEDURES:   Procedures  Critical Care:  no Chief Complaint  Patient presents with   Groin Pain      MEDICATIONS ORDERED IN ED: Medications  iohexol  (OMNIPAQUE ) 300 MG/ML solution 100 mL (100 mLs Intravenous Contrast Given 11/22/23 1222)     IMPRESSION / MDM / ASSESSMENT AND PLAN / ED COURSE  I reviewed the triage vital signs and the nursing notes.                              Differential diagnosis includes, but is not limited to, incarcerated hernia, hernia, muscle strain, inguinal pain  Patient's presentation is most consistent with acute illness / injury with system symptoms.   Labs and imaging  ordered   labs are reassuring ,  Ct abdomen pelvis independently reviewed interpreted by me as being positive for inguinal hernia that is fat-containing, confirmed by radiology  Did explain findings patient.  Follow-up surgery if needed.  Explained to him this is nonsurgical at this time. he is in agreement treatment plan.  Discharged stable condition.   FINAL CLINICAL IMPRESSION(S) / ED DIAGNOSES   Final diagnoses:  Inguinal hernia of left side without obstruction or gangrene     Rx / DC Orders   ED Discharge Orders     None        Note:  This document was prepared using Dragon voice recognition software and may include unintentional dictation errors.    Gasper Devere ORN, PA-C 11/22/23 1420    Claudene Rover, MD 11/22/23 [phone]

## 2023-11-23 DIAGNOSIS — K4090 Unilateral inguinal hernia, without obstruction or gangrene, not specified as recurrent: Secondary | ICD-10-CM | POA: Diagnosis not present

## 2023-11-26 ENCOUNTER — Ambulatory Visit: Payer: Self-pay | Admitting: General Surgery

## 2023-11-27 ENCOUNTER — Encounter: Payer: Self-pay | Admitting: Urgent Care

## 2023-11-29 ENCOUNTER — Inpatient Hospital Stay
Admission: RE | Admit: 2023-11-29 | Discharge: 2023-11-29 | Disposition: A | Discharge status: Home / Self Care | Source: Ambulatory Visit

## 2023-11-29 NOTE — Patient Instructions (Signed)
 Your procedure is scheduled on: Report to the Registration Desk on the 1st floor of the [facility]. To find out your arrival time, please call [phone] between 1PM - 3PM on: If your arrival time is 6:00 am, do not arrive before that time as the [facility] entrance doors do not open until 6:00 am.  REMEMBER: Instructions that are not followed completely may result in serious medical risk, up to and including death; or upon the discretion of your surgeon and anesthesiologist your surgery may need to be rescheduled.  Do not eat food after midnight the night before surgery.  No gum chewing or hard candies.  You may however, drink CLEAR liquids up to 2 hours before you are scheduled to arrive for your surgery. Do not drink anything within 2 hours of your scheduled arrival time.  Clear liquids include: - water   - apple juice without pulp - gatorade (not RED colors) - black coffee or tea (Do NOT add milk or creamers to the coffee or tea) Do NOT drink anything that is not on this list.  **Type 1 and Type 2 diabetics should only drink water .**  In addition, your doctor has ordered for you to drink the provided:  Ensure Pre-Surgery Clear Carbohydrate Drink  Gatorade G2 Drinking this carbohydrate drink up to two hours before surgery helps to reduce insulin resistance and improve patient outcomes. Please complete drinking 2 hours before scheduled arrival time.  One week prior to surgery: Stop Anti-inflammatories (NSAIDS) such as Advil, Aleve, Ibuprofen, Motrin, Naproxen, Naprosyn and Aspirin based products such as Excedrin, Goody's Powder, BC Powder. Stop ANY OVER THE COUNTER supplements until after surgery.  You may however, continue to take Tylenol  if needed for pain up until the day of surgery.  **Follow guidelines for insulin and diabetes medications.**  **Follow recommendations regarding stopping blood thinners.**  Continue taking all of your other prescription medications up  until the day of surgery.  ON THE DAY OF SURGERY ONLY TAKE THESE MEDICATIONS WITH SIPS OF WATER :    Use inhalers on the day of surgery and bring to the hospital.  Fleets enema or bowel prep as directed.  No Alcohol for 24 hours before or after surgery.  No Smoking including e-cigarettes for 24 hours before surgery.  No chewable tobacco products for at least 6 hours before surgery.  No nicotine patches on the day of surgery.  Do not use any recreational drugs for at least a week (preferably 2 weeks) before your surgery.  Please be advised that the combination of cocaine and anesthesia may have negative outcomes, up to and including death. If you test positive for cocaine, your surgery will be cancelled.  On the morning of surgery brush your teeth with toothpaste and water , you may rinse your mouth with mouthwash if you wish. Do not swallow any toothpaste or mouthwash.  Use CHG Soap or wipes as directed on instruction sheet.  Do not wear jewelry, make-up, hairpins, clips or nail polish.  For welded (permanent) jewelry: bracelets, anklets, waist bands, etc.  Please have this removed prior to surgery.  If it is not removed, there is a chance that hospital personnel will need to cut it off on the day of surgery.  Do not wear lotions, powders, or perfumes.   Do not shave body hair from the neck down 48 hours before surgery.  Contact lenses, hearing aids and dentures may not be worn into surgery.  Do not bring valuables to the hospital. [facility]  is not responsible for any missing/lost belongings or valuables.   Total Shoulder Arthroplasty:  use Benzoyl Peroxide 5% Gel as directed on instruction sheet.  Bring your C-PAP to the hospital in case you may have to spend the night.   Notify your doctor if there is any change in your medical condition (cold, fever, infection).  Wear comfortable clothing (specific to your surgery type) to the hospital.  After surgery, you can help  prevent lung complications by doing breathing exercises.  Take deep breaths and cough every 1-2 hours. Your doctor may order a device called an Incentive Spirometer to help you take deep breaths. When coughing or sneezing, hold a pillow firmly against your incision with both hands. This is called "splinting." Doing this helps protect your incision. It also decreases belly discomfort.  If you are being admitted to the hospital overnight, leave your suitcase in the car. After surgery it may be brought to your room.  In case of increased patient census, it may be necessary for you, the patient, to continue your postoperative care in the Same Day Surgery department.  If you are being discharged the day of surgery, you will not be allowed to drive home. You will need a responsible individual to drive you home and stay with you for 24 hours after surgery.   If you are taking public transportation, you will need to have a responsible individual with you.  Please call the Pre-admissions Testing Dept. at [phone] if you have any questions about these instructions.  Surgery Visitation Policy:  Patients having surgery or a procedure may have two visitors.  Children under the age of 18 must have an adult with them who is not the patient.  Inpatient Visitation:    Visiting hours are 7 a.m. to 8 p.m. Up to four visitors are allowed at one time in a patient room. The visitors may rotate out with other people during the day.  One visitor age 64 or older may stay with the patient overnight and must be in the room by 8 p.m.   Merchandiser, retail to address health-related social needs:  [email or web address]

## 2023-11-30 ENCOUNTER — Encounter: Payer: Self-pay | Admitting: Family Medicine

## 2023-11-30 ENCOUNTER — Other Ambulatory Visit: Payer: Self-pay

## 2023-11-30 ENCOUNTER — Encounter: Payer: Self-pay | Admitting: Urgent Care

## 2023-11-30 ENCOUNTER — Encounter: Payer: Self-pay | Admitting: General Surgery

## 2023-11-30 ENCOUNTER — Encounter
Admission: RE | Admit: 2023-11-30 | Discharge: 2023-11-30 | Disposition: A | Discharge status: Home / Self Care | Source: Ambulatory Visit | Attending: General Surgery

## 2023-11-30 ENCOUNTER — Ambulatory Visit (INDEPENDENT_AMBULATORY_CARE_PROVIDER_SITE_OTHER): Admitting: Family Medicine

## 2023-11-30 ENCOUNTER — Encounter
Admission: RE | Admit: 2023-11-30 | Discharge: 2023-11-30 | Disposition: A | Discharge status: Home / Self Care | Source: Ambulatory Visit | Attending: General Surgery | Admitting: General Surgery

## 2023-11-30 VITALS — Ht 72.0 in | Wt 178.0 lb

## 2023-11-30 VITALS — BP 159/92 | HR 72 | Temp 97.8°F | Ht 72.0 in | Wt 181.3 lb

## 2023-11-30 DIAGNOSIS — I2510 Atherosclerotic heart disease of native coronary artery without angina pectoris: Secondary | ICD-10-CM

## 2023-11-30 DIAGNOSIS — K4090 Unilateral inguinal hernia, without obstruction or gangrene, not specified as recurrent: Secondary | ICD-10-CM

## 2023-11-30 DIAGNOSIS — R32 Unspecified urinary incontinence: Secondary | ICD-10-CM

## 2023-11-30 DIAGNOSIS — I10 Essential (primary) hypertension: Secondary | ICD-10-CM | POA: Insufficient documentation

## 2023-11-30 DIAGNOSIS — I447 Left bundle-branch block, unspecified: Secondary | ICD-10-CM

## 2023-11-30 DIAGNOSIS — I700 Atherosclerosis of aorta: Secondary | ICD-10-CM | POA: Diagnosis not present

## 2023-11-30 DIAGNOSIS — Z8719 Personal history of other diseases of the digestive system: Secondary | ICD-10-CM | POA: Insufficient documentation

## 2023-11-30 DIAGNOSIS — J432 Centrilobular emphysema: Secondary | ICD-10-CM

## 2023-11-30 DIAGNOSIS — I440 Atrioventricular block, first degree: Secondary | ICD-10-CM

## 2023-11-30 DIAGNOSIS — Z01810 Encounter for preprocedural cardiovascular examination: Secondary | ICD-10-CM | POA: Diagnosis not present

## 2023-11-30 DIAGNOSIS — L209 Atopic dermatitis, unspecified: Secondary | ICD-10-CM | POA: Diagnosis not present

## 2023-11-30 DIAGNOSIS — Z7689 Persons encountering health services in other specified circumstances: Secondary | ICD-10-CM

## 2023-11-30 DIAGNOSIS — Z01818 Encounter for other preprocedural examination: Secondary | ICD-10-CM

## 2023-11-30 HISTORY — DX: Dyspnea, unspecified: R06.00

## 2023-11-30 HISTORY — DX: Personal history of other diseases of the respiratory system: Z87.09

## 2023-11-30 HISTORY — DX: Chronic obstructive pulmonary disease, unspecified: J44.9

## 2023-11-30 HISTORY — DX: Left bundle-branch block, unspecified: I44.7

## 2023-11-30 HISTORY — DX: Acute posthemorrhagic anemia: D62

## 2023-11-30 HISTORY — DX: Hyperlipidemia, unspecified: E78.5

## 2023-11-30 HISTORY — DX: Essential (primary) hypertension: I10

## 2023-11-30 HISTORY — DX: Acute respiratory failure, unspecified whether with hypoxia or hypercapnia: J96.00

## 2023-11-30 MED ORDER — TRELEGY ELLIPTA 100-62.5-25 MCG/ACT IN AEPB: 1.0000 | INHALATION_SPRAY | Freq: Every day | RESPIRATORY_TRACT | 6 refills | Status: AC

## 2023-11-30 MED ORDER — ALBUTEROL SULFATE HFA 108 (90 BASE) MCG/ACT IN AERS: 2.0000 | INHALATION_SPRAY | Freq: Four times a day (QID) | RESPIRATORY_TRACT | 5 refills | Status: AC | PRN

## 2023-11-30 MED ORDER — CLOBETASOL PROPIONATE 0.05 % EX SOLN: 1.0000 | Freq: Two times a day (BID) | CUTANEOUS | 0 refills | Status: AC

## 2023-11-30 MED ORDER — ATORVASTATIN CALCIUM 40 MG PO TABS: 40.0000 mg | ORAL_TABLET | Freq: Every evening | ORAL | 3 refills | Status: AC

## 2023-11-30 NOTE — Progress Notes (Signed)
 Perioperative Services Pre-Admission/Anesthesia Testing    Date: 11/30/23  Name: Jordan Barber DOB: December 14, 1945 MRN:   [id number]  Re: Plans for surgery; new LBBB on ECG  Planned Surgical Procedure(s):     Case: [id number] Date/Time: 12/06/23 0843   Procedure: REPAIR, HERNIA, INGUINAL, ADULT (Left: Groin)   Anesthesia type: General   Pre-op diagnosis: K40.90 non recurrent unilateral inguinal hernia w/o obstruction or gangrene   Location: [facility] OR ROOM 04 / [facility] AN[facility]   Surgeons: Rodolph Romano, MD        Clinical Notes:  Patient is scheduled for the above procedure on 12/06/2023 with Dr. Romano Rodolph, MD.  Post procedure, patient presented to the PAT clinic on the afternoon of 11/30/2023 for preoperative testing.  Preoperative ECG showing sinus rhythm with a first-degree AV block with frequent PVCs and presumably acute onset LBBB when compared to previous tracing.  Past medical history reviewed and updated in [facility], as patient establishing care with new medicine provider this afternoon.  Patient with multiple risk factors for ASCVD, including: Age Sex CAD Diastolic dysfunction Aortic atherosclerosis Previous CVA HTN - on CCB (amlodipine ) monotherapy HLD - on statin (atorvastatin ) therapy 50+ pack year smoking history; quit 01/2021 COPD ETOH use (+) FHx of CV disease in 1st degree male relative (father)   Patient denies any use of any illegal substances or stimulant medications. Weight is normal with a BMI of 24.14 kg/m. Patient is on daily prophylactic antithrombotic therapy using ASA 81 mg.  ECG:    Impression and Plan:  Jordan Barber is scheduled for elective hernia repair on 12/06/2023.  Preoperative ECG showed new onset LBBB.  Patient with multiple risk cardiac factors and has a (+) familial history of cardiovascular disease.  Patient establishing care with new medicine provider (Pardue, DO) today (11/30/2023). Prior to him being seen  for his initial visit, I attempted to enter all available medical history so that Dr. Donzella would have a complete clinical picture when she saw the patient in her clinic.   Reached out to PCP via [facility] to discuss acute findings on today's ECG and plans for upcoming elective surgery. Given that LBBB is presumably new, favor cardiovascular evaluation and clearance prior to proceeding with elective procedure; PCP in agreement. PCP to place referral to cardiology for evaluation and clearance.   No changes are being made to the surgical schedule at this time. Patient is aware that current surgery date is contingent upon him being seen and cleared by cardiology service line.  In speaking with cardiology, they are graciously willing to work patient in to be seen this week on Friday (12/03/2023) at 0900 AM; office is contacting the patient to offer this appointment. With the new ECG findings, patient aware that ischemic testing will be indicated and would likely include +/- coronary calcium  CT, myocardial perfusion imaging study, and/or echocardiogram.   I will reach out to Dr. Rodolph, MD to make him aware of the aforementioned plans so that preparations can be made should surgical plan of care need to be altered to allow for preoperative cardiology evaluation. Again, testing will likely be required before clearance for elective surgery can be provided.    No further needs from the PAT department identified at this time.  Dorise Pereyra, MSN, APRN, FNP-C, CEN [facility]  Perioperative Services Nurse Practitioner for Phone: [phone] Fax: [phone] 11/30/23 3:15 PM  NOTE: This note has been prepared using Dragon dictation software. Despite my best ability  to proofread, there is always the potential that unintentional transcriptional errors may still occur from this process.

## 2023-11-30 NOTE — Progress Notes (Unsigned)
 New patient visit   Patient: Jordan Barber   DOB: 01/27/1946   78 y.o. Male  MRN: [id number] Visit Date: 11/30/2023  Today's healthcare provider: LAURAINE LOISE BUOY, DO   Chief Complaint  Patient presents with  . New Patient (Initial Visit)    Establish care with a primary provider, brought in an EKG to put in his file for his surgery that he is having next week.  Hernia surgery on the groin area.   Subjective    Jordan Barber is a 78 y.o. male who presents today as a new patient to establish care.  HPI HPI     New Patient (Initial Visit)    Additional comments: Establish care with a primary provider, brought in an EKG to put in his file for his surgery that he is having next week.  Hernia surgery on the groin area.      Last edited by Terrel Powell CROME, CMA on 11/30/2023  3:29 PM.       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 Acute blood loss anemia (ABLA)    after hip surgery 01/2021  . Acute respiratory failure (HCC)    after hip surgery 01/2021  . Aortic atherosclerosis (HCC)   . Bilateral carotid artery disease (HCC)   . Bilateral renal cysts   . CAD (coronary artery disease)   . Cerebral microvascular disease   . Cervical spinal stenosis   . COPD (chronic obstructive pulmonary disease) (HCC)   . Dyspnea   . Emphysema of lung (HCC)   . Erectile dysfunction    a.) on PDE5i (sildenafil)  . Fall   . Hip fracture, right, closed, initial encounter (HCC) 02/08/2021  . History of acute respiratory failure   . HLD (hyperlipidemia)   . Hypertension   . Lacunar cerebrovascular accident (CVA) (HCC)    a.) noted on brain MRI 04/19/2021; LEFT thalamocapsular junction  . LAFB (left anterior fascicular block)   . Left inguinal hernia   . New onset left bundle branch block (LBBB)  11/30/2023   a.) noted on preoperative ECG on 11/30/2023  . Prostate cancer [facility])    a.) s/p radical prostatectomy 2011  . Pulmonary nodules   . Ramsay Hunt syndrome (geniculate herpes zoster)   . Supraumbilical hernia   . Tobacco abuse    Past Surgical History:  Procedure Laterality Date  . COLONOSCOPY  10/18/2006  . COLONOSCOPY WITH PROPOFOL  N/A 02/03/2017   Procedure: COLONOSCOPY WITH PROPOFOL ;  Surgeon: Dessa Reyes ORN, MD;  Location: [facility] ENDOSCOPY;  Service: Endoscopy;  Laterality: N/A;  . FRACTURE SURGERY  2022 & 2016   Tibia, Hip, Wrist  . HERNIA REPAIR  groin hernia   sugery scheduled Aug 11  . INTRAMEDULLARY (IM) NAIL INTERTROCHANTERIC Right 02/09/2021   Procedure: INTRAMEDULLARY (IM) NAIL INTERTROCHANTRIC;  Surgeon: Edie Norleen PARAS, MD;  Location: [facility] ORS;  Service: Orthopedics;  Laterality: Right;  . OPEN REDUCTION INTERNAL FIXATION (ORIF) DISTAL RADIAL FRACTURE Right 02/09/2021   Procedure: OPEN REDUCTION INTERNAL FIXATION (ORIF) DISTAL RADIAL FRACTURE;  Surgeon: Edie Norleen PARAS, MD;  Location: [facility] ORS;  Service: Orthopedics;  Laterality: Right;  . ORIF TIBIA PLATEAU Left 11/29/2015   Procedure: OPEN REDUCTION INTERNAL FIXATION (ORIF) TIBIAL PLATEAU;  Surgeon: Norleen PARAS Edie, MD;  Location: [facility]  ORS;  Service: Orthopedics;  Laterality: Left;  . PROSTATECTOMY N/A 2011   Family Status  Relation Name Status  . Mother  Deceased  . Father Helayne MATSU Chacko   deceased 29-May-1979 Deceased  . Sister Jayvion Stefanski (deceased 12/20/21 Alive  No partnership data on file   Family History  Problem Relation Age of Onset  . Dementia Mother   . Heart disease Father   . Alcohol abuse Sister    Social History   Socioeconomic History  . Marital status: Married    Spouse name: Not on file  . Number of children: Not on file  . Years of education: Not on file  . Highest education level: Some college, no degree  Occupational History  . Not on file  Tobacco Use  . Smoking status:  Former    Current packs/day: 0.00    Average packs/day: 1 pack/day for 50.0 years (50.0 ttl pk-yrs)    Types: Cigarettes    Start date: 02/09/1971    Quit date: 02/08/2021    Years since quitting: 2.8  . Smokeless tobacco: Never  Vaping Use  . Vaping status: Never Used  Substance and Sexual Activity  . Alcohol use: Yes    Comment: maybe a 6 pack beer per year, bourbon maybe 2 times a year  . Drug use: No  . Sexual activity: Not Currently    Birth control/protection: None  Other Topics Concern  . Not on file  Social History Narrative  . Not on file   Social Drivers of Health   Financial Resource Strain: Low Risk  (11/29/2023)   Overall Financial Resource Strain (CARDIA)   . Difficulty of Paying Living Expenses: Not hard at all  Food Insecurity: No Food Insecurity (11/30/2023)   Hunger Vital Sign   . Worried About Programme researcher, broadcasting/film/video in the Last Year: Never true   . Ran Out of Food in the Last Year: Never true  Transportation Needs: No Transportation Needs (11/29/2023)   PRAPARE - Transportation   . Lack of Transportation (Medical): No   . Lack of Transportation (Non-Medical): No  Physical Activity: Sufficiently Active (11/29/2023)   Exercise Vital Sign   . Days of Exercise per Week: 7 days   . Minutes of Exercise per Session: 30 min  Stress: No Stress Concern Present (11/29/2023)   [employer] of Occupational Health - Occupational Stress Questionnaire   . Feeling of Stress: Only a little  Social Connections: Socially Integrated (11/29/2023)   Social Connection and Isolation Panel   . Frequency of Communication with Friends and Family: More than three times a week   . Frequency of Social Gatherings with Friends and Family: Twice a week   . Attends Religious Services: More than 4 times per year   . Active Member of Clubs or Organizations: Yes   . Attends Banker Meetings: 1 to 4 times per year   . Marital Status: Married   Outpatient Medications Prior to Visit   Medication Sig  . amLODipine  (NORVASC ) 2.5 MG tablet Take 2.5 mg by mouth at bedtime.  . aspirin  EC 81 MG EC tablet Take 1 tablet (81 mg total) by mouth daily. Swallow whole.  . atorvastatin  (LIPITOR) 40 MG tablet Take 1 tablet (40 mg total) by mouth daily. (Patient taking differently: Take 40 mg by mouth at bedtime.)  . clobetasol  ointment (TEMOVATE ) 0.05 % Apply 1 Application topically 2 (two) times daily as needed (Skin problems).  . fluticasone  (FLONASE ) 50 MCG/ACT nasal  spray Place 2 sprays into both nostrils daily.  . sildenafil (REVATIO) 20 MG tablet Take 20 mg by mouth daily as needed (ED).  . TRELEGY ELLIPTA  100-62.5-25 MCG/ACT AEPB Inhale 1 puff into the lungs daily.   No facility-administered medications prior to visit.   Allergies  Allergen Reactions  . Amoxicillin Diarrhea    Immunization History  Administered Date(s) Administered  . Fluad Quad(high Dose 65+) 02/10/2021  . PFIZER(Purple Top)SARS-COV-2 Vaccination 05/19/2019, 06/09/2019, 01/23/2020    Health Maintenance  Topic Date Due  . Medicare Annual Wellness (AWV)  Never done  . Hepatitis C Screening  Never done  . DTaP/Tdap/Td (1 - Tdap) Never done  . Pneumococcal Vaccine: 50+ Years (1 of 2 - PCV) Never done  . Zoster Vaccines- Shingrix (1 of 2) Never done  . COVID-19 Vaccine (4 - 2024-25 season) 12/27/2022  . INFLUENZA VACCINE  11/26/2023  . Lung Cancer Screening  06/17/2024  . Hepatitis B Vaccines  Aged Out  . HPV VACCINES  Aged Out  . Meningococcal B Vaccine  Aged Out  . Colonoscopy  Discontinued    Patient Care Team: Tiant Peixoto N, DO as PCP - General (Family Medicine)  Review of Systems  {Insert previous labs (optional):23779} {See past labs  Heme  Chem  Endocrine  Serology  Results Review (optional):1}   Objective    BP (!) 160/95 (BP Location: Right Arm, Patient Position: Sitting, Cuff Size: Normal)   Pulse 73   Temp 97.8 F (36.6 C) (Oral)   Ht 6' (1.829 m)   Wt 181 lb 4.8 oz  (82.2 kg)   SpO2 97%   BMI 24.59 kg/m  {Insert last BP/Wt (optional):23777}{See vitals history (optional):1}   Physical Exam Vitals and nursing note reviewed.  Constitutional:      General: He is not in acute distress.    Appearance: Normal appearance.  HENT:     Head: Normocephalic and atraumatic.  Eyes:     General: No scleral icterus.    Conjunctiva/sclera: Conjunctivae normal.  Cardiovascular:     Rate and Rhythm: Normal rate.  Pulmonary:     Effort: Pulmonary effort is normal.  Neurological:     Mental Status: He is alert and oriented to person, place, and time. Mental status is at baseline.  Psychiatric:        Mood and Affect: Mood normal.        Behavior: Behavior normal.      Depression Screen    11/30/2023    3:37 PM  PHQ 2/9 Scores  PHQ - 2 Score 0  PHQ- 9 Score 0   No results found for any visits on 11/30/23.  Assessment & Plan     There are no diagnoses linked to this encounter.   ***  No follow-ups on file.     I discussed the assessment and treatment plan with the patient  The patient was provided an opportunity to ask questions and all were answered. The patient agreed with the plan and demonstrated an understanding of the instructions.   The patient was advised to call back or seek an in-person evaluation if the symptoms worsen or if the condition fails to improve as anticipated.    LAURAINE LOISE BUOY, DO  [facility] [phone] (phone) [phone] (fax)  [facility]

## 2023-11-30 NOTE — Patient Instructions (Addendum)
 Good options are Omron or Microlife.   Check your blood pressure once daily, and any time you have concerning symptoms like headache, chest pain, dizziness, shortness of breath, or vision changes.   Our goal is less than 140/90.  To appropriately check your blood pressure, make sure you do the following:  1) Avoid caffeine, exercise, or tobacco products for 30 minutes before checking. Empty your bladder. 2) Sit with your back supported in a flat-backed chair. Rest your arm on something flat (arm of the chair, table, etc). 3) Sit still with your feet flat on the floor, resting, for at least 5 minutes.  4) Check your blood pressure. Take 1-2 readings.  5) Write down these readings and bring with you to any provider appointments.  Bring your home blood pressure machine with you to a provider's office for accuracy comparison at least once a year.   Make sure you take your blood pressure medications before you come to any office visit, even if you were asked to fast for labs.

## 2023-11-30 NOTE — Patient Instructions (Addendum)
 Your procedure is scheduled on: Monday 12/06/23 Report to the Registration Desk on the 1st floor of the [facility]. To find out your arrival time, please call [phone] between 1PM - 3PM on: Friday 12/03/23 If your arrival time is 6:00 am, do not arrive before that time as the Medical Mall entrance doors do not open until 6:00 am.  REMEMBER: Instructions that are not followed completely may result in serious medical risk, up to and including death; or upon the discretion of your surgeon and anesthesiologist your surgery may need to be rescheduled.  Do not eat food or drink any liquids after midnight the night before surgery.  No gum chewing or hard candies.  One week prior to surgery: Stop Anti-inflammatories (NSAIDS) such as Advil, Aleve, Ibuprofen, Motrin, Naproxen, Naprosyn and Aspirin  based products such as Excedrin, Goody's Powder, BC Powder.  You may however, continue to take Tylenol  if needed for pain up until the day of surgery.  Stop ALL OVER THE COUNTER supplements and vitamins until after surgery.    **Follow recommendations regarding stopping blood thinners.** You stated you have held your aspirin  since Sunday 11/28/23  Continue taking all of your other prescription medications up until the day of surgery.  ON THE DAY OF SURGERY ONLY TAKE THESE MEDICATIONS WITH SIPS OF WATER:  No oral medications  Use inhalers on the day of surgery and bring to the hospital. You may also use your nasal spray  No Alcohol for 24 hours before or after surgery.  No Smoking including e-cigarettes for 24 hours before surgery.  No chewable tobacco products for at least 6 hours before surgery.  No nicotine  patches on the day of surgery.  Do not use any recreational drugs for at least a week (preferably 2 weeks) before your surgery.  Please be advised that the combination of cocaine and anesthesia may have negative outcomes, up to and including death. If you test positive for cocaine,  your surgery will be cancelled.  On the morning of surgery brush your teeth with toothpaste and water, you may rinse your mouth with mouthwash if you wish. Do not swallow any toothpaste or mouthwash.  Use CHG Soap or wipes as directed on instruction sheet.  Do not wear lotions, powders, or cologne.   Do not shave body hair from the neck down 48 hours before surgery. You can shave your face and neck.  Wear comfortable clothing (specific to your surgery type) to the hospital.  Do not wear jewelry, make-up, hairpins, clips or nail polish.  For welded (permanent) jewelry: bracelets, anklets, waist bands, etc.  Please have this removed prior to surgery.  If it is not removed, there is a chance that hospital personnel will need to cut it off on the day of surgery.  Contact lenses, hearing aids and dentures may not be worn into surgery.  Do not bring valuables to the hospital. [facility] is not responsible for any missing/lost belongings or valuables.   Notify your doctor if there is any change in your medical condition (cold, fever, infection).  After surgery, you can help prevent lung complications by doing breathing exercises.  Take deep breaths and cough every 1-2 hours. Your doctor may order a device called an Incentive Spirometer to help you take deep breaths. When coughing or sneezing, hold a pillow or small towel firmly against your incision with your hand. This is called "splinting." Doing this helps protect your incision. It also decreases discomfort.  If you are being discharged  the day of surgery, you will not be allowed to drive home. You will need a responsible individual to drive you home and stay with you for 24 hours after surgery.   If you are taking public transportation, you will need to have a responsible individual with you.  Please call the Pre-admissions Testing Dept. at [phone] if you have any questions about these instructions.  Surgery Visitation  Policy:  Patients having surgery or a procedure may have two visitors.  Children under the age of 68 must have an adult with them who is not the patient.   Merchandiser, retail to address health-related social needs:  [email or web address]     Preparing for Surgery with CHLORHEXIDINE GLUCONATE (CHG) Soap  Chlorhexidine Gluconate (CHG) Soap  o An antiseptic cleaner that kills germs and bonds with the skin to continue killing germs even after washing  o Used for showering the night before surgery and morning of surgery  Before surgery, you can play an important role by reducing the number of germs on your skin.  CHG (Chlorhexidine gluconate) soap is an antiseptic cleanser which kills germs and bonds with the skin to continue killing germs even after washing.  Please do not use if you have an allergy to CHG or antibacterial soaps. If your skin becomes reddened/irritated stop using the CHG.  1. Shower the NIGHT BEFORE SURGERY and the MORNING OF SURGERY with CHG soap.  2. If you choose to wash your hair, wash your hair first as usual with your normal shampoo.  3. After shampooing, rinse your hair and body thoroughly to remove the shampoo.  4. Use CHG as you would any other liquid soap. You can apply CHG directly to the skin and wash gently with a scrungie or a clean washcloth.  5. Apply the CHG soap to your body only from the neck down. Do not use on open wounds or open sores. Avoid contact with your eyes, ears, mouth, and genitals (private parts). Wash face and genitals (private parts) with your normal soap.  6. Wash thoroughly, paying special attention to the area where your surgery will be performed.  7. Thoroughly rinse your body with warm water.  8. Do not shower/wash with your normal soap after using and rinsing off the CHG soap.  9. Pat yourself dry with a clean towel.  10. Wear clean pajamas to bed the night before surgery.  12. Place clean sheets on  your bed the night of your first shower and do not sleep with pets.  13. Shower again with the CHG soap on the day of surgery prior to arriving at the hospital.  14. Do not apply any deodorants/lotions/powders.  15. Please wear clean clothes to the hospital.

## 2023-12-01 DIAGNOSIS — I2510 Atherosclerotic heart disease of native coronary artery without angina pectoris: Secondary | ICD-10-CM | POA: Diagnosis not present

## 2023-12-01 DIAGNOSIS — I10 Essential (primary) hypertension: Secondary | ICD-10-CM | POA: Diagnosis not present

## 2023-12-01 DIAGNOSIS — Z01810 Encounter for preprocedural cardiovascular examination: Secondary | ICD-10-CM | POA: Diagnosis not present

## 2023-12-01 DIAGNOSIS — Z8249 Family history of ischemic heart disease and other diseases of the circulatory system: Secondary | ICD-10-CM | POA: Diagnosis not present

## 2023-12-01 DIAGNOSIS — I700 Atherosclerosis of aorta: Secondary | ICD-10-CM | POA: Diagnosis not present

## 2023-12-01 DIAGNOSIS — I447 Left bundle-branch block, unspecified: Secondary | ICD-10-CM | POA: Diagnosis not present

## 2023-12-01 DIAGNOSIS — J439 Emphysema, unspecified: Secondary | ICD-10-CM | POA: Diagnosis not present

## 2023-12-01 DIAGNOSIS — Z87891 Personal history of nicotine dependence: Secondary | ICD-10-CM | POA: Diagnosis not present

## 2023-12-01 NOTE — Progress Notes (Signed)
 New Patient Visit   Chief Complaint: Chief Complaint  Patient presents with  . New Patient    POC SOB- COPD    Date of Service: 12/01/2023 Date of Birth: August 08, 1945 PCP: Donzella Domino, DO [address] [place] [address]  History of Present Illness:   Mr. Ciano is a 78 y.o.male patient that presents for evaluation of LBBB, referred by pre-op for cardiac clearance.  PMH: H/O CVA vs Ramsay Hunt syndrome 11/2017 H/O tobacco use HTN HLD H/O TIA 03/2021 COPD New LBBB by 11/30/23 EKG  No prior MI or PCI/stent.  Family CVD History: Father - MI age 61, died Paternal uncles and aunts (6 siblings) - MI Patient's brother without CVD.  He was seen by his PCP yesterday 11/30/23 (Dr. Donzella, [facility]). New LBBB noted on EKG with note stating no cardiology evaluation in 10 years. Also has inguinal hernia surgery planned for 12/06/23 and requesting cardiac clearance. General surgery notes state he will have an open surgical repair. CT showed left inguinal hernia containing fat.   He has a h/o CVA vs Ramsay Hunt syndrome 11/2017. CVA was suspected and patient was offered admission with work up, pt declined. Started on aspirin  and statin therapy. Seen 04/19/2021 in ED with slurred speech and left facial droop with TIA suspected. MRI negative for acute infarct. 2D ECHO done 04/19/21 in ED showing normal biventricular systolic function, EF 55-60%, H8II, no significant valvular disease. He had a CT Chest 06/18/23 for lung cancer screening that was significant for three vessel coronary atherosclerosis and atherosclerotic nonaneurysmal thoracic aorta.   Patient presents today for initial visit. He denies chest pain, indigestion, edema, palpitations, dizziness, presyncope/syncope. Chronic DOE over last 3 years, unchanged. Denies any significant functional decline. He walks daily with his dog 1-1.5 miles. He gets DOE but no chest pain with exertion. EKG from 11/30/23 appointment with  PCP shows sinus rhythm, HR 74 bpm, prolonged AV conduction (PR 214 ms), LBBB (QRS 150 ms), frequent PVCs in pattern of trigeminy.  Recent labs show CMP with mild hyponatremia (132), normal renal function, normal potassium and calcium  levels. CBC WNL. Prior EKG from 04/19/21 showed NSR, prolonged AV conduction, no LBBB present. He reports some mild intermittent groin pain. Denies significant pain. CT showed left inguinal hernia containing fat, no mention of incarcerated bowel or bowel herniation.    Past Medical and Surgical History  Past Medical History Past Medical History:  Diagnosis Date  . Chickenpox   . Malignant neoplasm of prostate (CMS/HHS-HCC) 02/29/2012    Past Surgical History He has a past surgical history that includes open reduction and internal fixation of displaced split compression fracture, left lateral tibial plateau  (Left, 11/29/2015); Laparoscopic pelvic lymph node sampling. Laparoscopic prostatectomy robotic. (08/26/2010); and  1. Reduction and internal fixation of displaced intertrochanteric right hip fracture with Biomet Affixis TFN nail. 2. Open reduction and internal fixation of displaced intra-articular right distal radius fracture (Right, 02/09/2021).   Medications and Allergies  Current Medications  Current Outpatient Medications on File Prior to Visit  Medication Sig Dispense Refill  . albuterol  MDI, PROVENTIL , VENTOLIN , PROAIR , HFA 90 mcg/actuation inhaler Inhale 2 inhalations into the lungs every 6 (six) hours as needed for Wheezing    . amLODIPine  (NORVASC ) 2.5 MG tablet Take 1 tablet by mouth once daily    . aspirin  81 MG EC tablet Take 81 mg by mouth daily.    . atorvastatin  (LIPITOR) 40 MG tablet Take 40 mg by mouth once daily    .  sildenafil (REVATIO) 20 mg tablet Take 20 mg by mouth    . TRELEGY ELLIPTA  100-62.5-25 mcg inhaler Inhale 1 Puff into the lungs once daily    . clobetasol  (CORMAX ) 0.05 % external solution Apply topically. Apply 1 application  topically 2 (two) times daily. (Patient not taking: Reported on 12/01/2023)    . clobetasol  (TEMOVATE ) 0.05 % ointment Apply topically. Apply 1 application topically 2 (two) times daily. (Patient not taking: Reported on 12/01/2023)    . multivitamin with iron-B12-Vitamin C-Folic Acid (FOLTRIN) 110-0.5 mg capsule Take by mouth Take 1 capsule by mouth 2 (two) times daily after a meal (Patient not taking: Reported on 12/01/2023)     No current facility-administered medications on file prior to visit.    Allergies: Penicillin  Social and Family History  Social History  reports that he has quit smoking. His smoking use included cigarettes. He has never used smokeless tobacco. He reports that he does not drink alcohol and does not use drugs.  Family History Family History  Problem Relation Name Age of Onset  . Dementia Mother    . Myocardial Infarction (Heart attack) Father      Review of Systems   Review of Systems  Respiratory:  Positive for shortness of breath.   Cardiovascular:  Negative for chest pain, palpitations and leg swelling.  Gastrointestinal:  Negative for heartburn.  Neurological:  Negative for dizziness and loss of consciousness.      Physical Examination   Vitals:BP 124/76   Pulse 84   Ht 182.9 cm (6')   Wt 81.6 kg (180 lb)   SpO2 92%   BMI 24.41 kg/m  Ht:182.9 cm (6') Wt:81.6 kg (180 lb) ADJ:Anib surface area is 2.04 meters squared. Body mass index is 24.41 kg/m.  Physical Exam Vitals reviewed.  Constitutional:      General: He is not in acute distress.    Appearance: Normal appearance.  Cardiovascular:     Rate and Rhythm: Normal rate and regular rhythm.     Heart sounds: Normal heart sounds. No murmur heard. Pulmonary:     Effort: Pulmonary effort is normal. No respiratory distress.     Breath sounds: Normal breath sounds.  Musculoskeletal:     Right lower leg: No edema.     Left lower leg: No edema.  Neurological:     Mental Status: He is alert.       Assessment   78 y.o. male with  Encounter Diagnoses  Name Primary?  . Preop cardiovascular exam   . Essential hypertension   . Pulmonary emphysema, unspecified emphysema type (CMS/HHS-HCC)   . New onset left bundle branch block (LBBB) Yes  . Family history of ischemic heart disease   . Coronary artery disease involving native coronary artery of native heart without angina pectoris   . Thoracic aortic atherosclerosis ()   . History of tobacco use     Plan   Orders Placed This Encounter  Procedures  . NM myocardial perfusion SPECT multiple (stress and rest)  . ECG 12-lead  . Echo complete   Patient reports mild intermittent left inguinal pain 2/2 hernia. CT showed left inguinal hernia containing fat, no mention of incarcerated bowel or bowel herniation. Recommend ischemic and functional w/u prior to elective surgery with new LBBB, history of CVA, history of TIA, HTN, HLD, 3v CAD by CT scan, h/o tobacco use, and significant family history of IHD. Patient with chronic DOE without acute changes. Without chest pain. No acute ST-T wave changes on  EKG today. Proceed with Lexiscan and ECHO. ED precautions discussed.   BP at goal. Continue current regimen. No changes today.  No recent lipid testing - will assess at follow up visit. Continue statin and aspirin  therapy with h/o TIA and CVA.   Continue inhalers for COPD management.   Return in about 2 weeks (around 12/15/2023).   Attestation Statement:   I personally performed the service, non-incident to. [facility])   MARY TINNIE LAUNIE MAIDEN, NP

## 2023-12-02 ENCOUNTER — Encounter: Payer: Self-pay | Admitting: General Surgery

## 2023-12-06 ENCOUNTER — Encounter: Admission: RE | Payer: Self-pay | Source: Home / Self Care

## 2023-12-06 ENCOUNTER — Ambulatory Visit: Admission: RE | Admit: 2023-12-06 | Source: Home / Self Care | Admitting: General Surgery

## 2023-12-06 DIAGNOSIS — Z01810 Encounter for preprocedural cardiovascular examination: Secondary | ICD-10-CM

## 2023-12-06 DIAGNOSIS — K4090 Unilateral inguinal hernia, without obstruction or gangrene, not specified as recurrent: Secondary | ICD-10-CM

## 2023-12-06 DIAGNOSIS — Z01812 Encounter for preprocedural laboratory examination: Secondary | ICD-10-CM

## 2023-12-06 HISTORY — DX: Other cerebral infarction due to occlusion or stenosis of small artery: I63.81

## 2023-12-06 HISTORY — DX: Cyst of kidney, acquired: N28.1

## 2023-12-06 HISTORY — DX: Spinal stenosis, cervical region: M48.02

## 2023-12-06 HISTORY — DX: Other cerebrovascular disease: I67.89

## 2023-12-06 HISTORY — DX: Left anterior fascicular block: I44.4

## 2023-12-06 HISTORY — DX: Other ill-defined heart diseases: I51.89

## 2023-12-06 HISTORY — DX: Disorder of arteries and arterioles, unspecified: I77.9

## 2023-12-06 HISTORY — DX: Atherosclerotic heart disease of native coronary artery without angina pectoris: I25.10

## 2023-12-06 HISTORY — DX: Other nonspecific abnormal finding of lung field: R91.8

## 2023-12-06 HISTORY — DX: Unilateral inguinal hernia, without obstruction or gangrene, not specified as recurrent: K40.90

## 2023-12-06 HISTORY — DX: Transient cerebral ischemic attack, unspecified: G45.9

## 2023-12-06 HISTORY — DX: Male erectile dysfunction, unspecified: N52.9

## 2023-12-06 HISTORY — DX: Atherosclerosis of aorta: I70.0

## 2023-12-06 HISTORY — DX: Ventral hernia without obstruction or gangrene: K43.9

## 2023-12-06 SURGERY — REPAIR, HERNIA, INGUINAL, ADULT
Anesthesia: General | Site: Groin | Laterality: Left

## 2023-12-07 DIAGNOSIS — I2510 Atherosclerotic heart disease of native coronary artery without angina pectoris: Secondary | ICD-10-CM | POA: Diagnosis not present

## 2023-12-07 DIAGNOSIS — I447 Left bundle-branch block, unspecified: Secondary | ICD-10-CM | POA: Diagnosis not present

## 2023-12-08 ENCOUNTER — Encounter: Payer: Self-pay | Admitting: Family Medicine

## 2023-12-10 ENCOUNTER — Other Ambulatory Visit: Payer: Self-pay | Admitting: Nurse Practitioner

## 2023-12-10 ENCOUNTER — Other Ambulatory Visit (HOSPITAL_COMMUNITY): Payer: Self-pay | Admitting: *Deleted

## 2023-12-10 ENCOUNTER — Encounter (HOSPITAL_COMMUNITY): Payer: Self-pay

## 2023-12-10 DIAGNOSIS — I447 Left bundle-branch block, unspecified: Secondary | ICD-10-CM

## 2023-12-10 DIAGNOSIS — R9439 Abnormal result of other cardiovascular function study: Secondary | ICD-10-CM

## 2023-12-10 DIAGNOSIS — R9430 Abnormal result of cardiovascular function study, unspecified: Secondary | ICD-10-CM

## 2023-12-14 ENCOUNTER — Ambulatory Visit
Admission: RE | Admit: 2023-12-14 | Discharge: 2023-12-14 | Disposition: A | Discharge status: Home / Self Care | Source: Ambulatory Visit | Attending: Nurse Practitioner | Admitting: Nurse Practitioner

## 2023-12-14 DIAGNOSIS — I2510 Atherosclerotic heart disease of native coronary artery without angina pectoris: Secondary | ICD-10-CM | POA: Insufficient documentation

## 2023-12-14 DIAGNOSIS — I447 Left bundle-branch block, unspecified: Secondary | ICD-10-CM | POA: Insufficient documentation

## 2023-12-14 DIAGNOSIS — R9439 Abnormal result of other cardiovascular function study: Secondary | ICD-10-CM | POA: Diagnosis not present

## 2023-12-14 LAB — NM PET CT CARDIAC PERFUSION MULTI W/ABSOLUTE BLOODFLOW
MBFR: 2.1
Nuc Rest EF: 51 %
Nuc Stress EF: 59 %
Peak HR: 78 {beats}/min
Rest HR: 83 {beats}/min
Rest MBF: 1 ml/g/min
Rest Nuclear Isotope Dose: 21.7 mCi
SRS: 0
SSS: 0
ST Depression (mm): 0 mm
Stress MBF: 2.1 ml/g/min
Stress Nuclear Isotope Dose: 21.2 mCi
TID: 0.99

## 2023-12-14 MED ORDER — REGADENOSON 0.4 MG/5ML IV SOLN
INTRAVENOUS | Status: AC
  Filled 2023-12-14: qty 5

## 2023-12-14 MED ORDER — REGADENOSON 0.4 MG/5ML IV SOLN
0.4000 mg | Freq: Once | INTRAVENOUS | Status: AC
  Administered 2023-12-14: 0.4 mg via INTRAVENOUS
  Filled 2023-12-14: qty 5

## 2023-12-14 MED ORDER — RUBIDIUM RB82 GENERATOR (RUBYFILL)
25.0000 | PACK | Freq: Once | INTRAVENOUS | Status: AC
  Administered 2023-12-14: 21.15 via INTRAVENOUS

## 2023-12-14 MED ORDER — RUBIDIUM RB82 GENERATOR (RUBYFILL)
25.0000 | PACK | Freq: Once | INTRAVENOUS | Status: AC
  Administered 2023-12-14: 21.69 via INTRAVENOUS

## 2023-12-14 NOTE — Progress Notes (Signed)
 Pt tolerated lexiscan . C/o SOB, resolved by end of scan. IV removed, PO caffeine declined.

## 2023-12-16 ENCOUNTER — Other Ambulatory Visit

## 2023-12-20 DIAGNOSIS — J439 Emphysema, unspecified: Secondary | ICD-10-CM | POA: Diagnosis not present

## 2023-12-20 DIAGNOSIS — Z8673 Personal history of transient ischemic attack (TIA), and cerebral infarction without residual deficits: Secondary | ICD-10-CM | POA: Diagnosis not present

## 2023-12-20 DIAGNOSIS — I10 Essential (primary) hypertension: Secondary | ICD-10-CM | POA: Diagnosis not present

## 2023-12-20 DIAGNOSIS — I447 Left bundle-branch block, unspecified: Secondary | ICD-10-CM | POA: Diagnosis not present

## 2023-12-23 ENCOUNTER — Ambulatory Visit: Payer: Self-pay | Admitting: General Surgery

## 2023-12-24 ENCOUNTER — Encounter
Admission: RE | Admit: 2023-12-24 | Discharge: 2023-12-24 | Disposition: A | Discharge status: Home / Self Care | Source: Ambulatory Visit | Attending: General Surgery | Admitting: General Surgery

## 2023-12-24 ENCOUNTER — Encounter: Payer: Self-pay | Admitting: General Surgery

## 2023-12-24 NOTE — Pre-Procedure Instructions (Signed)
 PAT follow up completed with patient, no new medication, no changes with allergies, LBBB added to medical problems, patient seen by cardiology and cleared for surgery, instructions reviewed, all questions answered.

## 2023-12-24 NOTE — Progress Notes (Signed)
 Perioperative / Anesthesia Services  Pre-Admission Testing Clinical Review / Pre-Operative Anesthesia Consult  Date: 12/28/23  PATIENT DEMOGRAPHICS: Name: Jordan Barber DOB: [birth year] MRN:   [id number]  Note: Available PAT nursing documentation and vital signs have been reviewed. Clinical nursing staff has updated patient's PMH/PSHx, current medication list, and drug allergies/intolerances to ensure complete and comprehensive history available to assist care teams in MDM as it pertains to the aforementioned surgical procedure and anticipated anesthetic course. Extensive review of available clinical information personally performed. [place] PMH and PSHx updated with any diagnoses/procedures that  may have been inadvertently omitted during his intake with the pre-admission testing department's nursing staff.  PLANNED SURGICAL PROCEDURE(S):   Case: [id number] Date/Time: 12/29/23 1505   Procedure: REPAIR, HERNIA, INGUINAL, ADULT (Left: Groin)   Anesthesia type: General   Pre-op diagnosis: K40.90 non recurrent unilateral inguinal hernia w/o obstruction or gangrene   Location: [facility] OR ROOM 04 / [facility]   Surgeons: Rodolph Romano, MD        CLINICAL DISCUSSION: Jordan Barber is a 78 y.o. male who is submitted for pre-surgical anesthesia review and clearance prior to him undergoing the above procedure. Patient is a Former Smoker (50 pack years; quit 01/2021). 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  Patient had presented to PAT for preoperative evaluation prior to  LEFT inguinal hernia repair.  Preoperative ECG revealed a presumably new LBBB.  Patient was referred to cardiology for further evaluation.  Patient was seen in the cardiology clinic on 12/01/2023; notes reviewed. At the time of his clinic visit, patient doing well overall from a cardiovascular perspective.  Patient noting chronic exertional dyspnea over the course of the last 3 years that was reportedly stable and at baseline.  Preoperative ECG revealed new LBBB.  Patient reported to cardiology that he had not been seen for cardiovascular care and over 10 years.  Could not recall specific diagnoses.  Patient denied any chest pain, PND, orthopnea, palpitations, significant peripheral edema, weakness, fatigue, vertiginous symptoms, or presyncope/syncope. Patient with a past medical history significant for cardiovascular diagnoses. Documented physical exam was grossly benign, providing no evidence of acute exacerbation and/or decompensation of the patient's known cardiovascular conditions.  Blood pressure well controlled at 124/76 mmHg on currently prescribed CCB (amlodipine ) monotherapy.  Patient is on atorvastatin  for his HLD diagnosis and ASCVD prevention. In the setting of known cardiovascular diagnoses, it is important note that patient is on a PDE5i medication (sildenafil) for an erectile dysfunction diagnosis. Patient is not diabetic. He does not have an OSAH diagnosis. Patient is able to complete all of his  ADL/IADLs without cardiovascular limitation.  Per the [facility]AS[facility], patient is able to achieve at least 4 METS of physical activity without experiencing any significant degree of angina/anginal equivalent symptoms. No changes were made to his medication regimen during his visit with cardiology.  Patient scheduled to follow-up with outpatient cardiology in 2 weeks or sooner if needed.  Since patient was last seen by cardiology, he has undergone the recommended cardiovascular workups including functional study,  MPI, and cardiac PET.  Results are as follows.  TTE performed on 12/07/2023 revealed a low normal left ventricular systolic function with an EF of 50%. There  was mild LVH.  There were no regional wall motion abnormalities. Left ventricular diastolic Doppler parameters consistent with abnormal relaxation (G1DD). Right ventricular size and function normal with a TAPSE measuring 2.5 cm  (normal Baskins >/= 1.6 cm).  RVSP = 46 mmHg consistent with newly diagnosed pulmonary hypertension.  There was trivial mitral and pulmonic and mild tricuspid valve regurgitation.  All transvalvular gradients were noted to be normal providing no evidence of hemodynamically significant valvular stenosis. Aorta normal in size with no evidence of ectasia or aneurysmal dilatation.  Myocardial perfusion imaging study was performed on 12/07/2023 revealing a mildly reduced left ventricular systolic function with an EF of 45%.  There were no regional wall motion abnormalities.  No artifact or left ventricular cavity size enlargement appreciated on review of imaging. SPECT images demonstrated a moderate intensity, minimally reversible perfusion defect in entire inferior and septal wall.  Cardiology indicated that these findings could be seen with diaphragmatic attenuation artifact along with newly diagnosed LBBB, however a component of ischemia could not be ruled out.  Sum difference score 2. TID ratio = 1.14.  Further evaluation was recommended.  Patient underwent a cardiac PET scan on 12/14/2023.  The study revealed a normal left ventricular systolic function with an EF of 51% at rest and 59% with stress.  Myocardial blood flow 1.00 mL/g/min at rest 2.10 mL/g/min with stress.  Global myocardial blood flow reserve was 2.10.  Coronary calcium  was present on the attenuation correction CT images with calcifications noted in the LAD, LCx, and RCA distributions.  Left ventricular perfusion normal with no evidence of reversible ischemia or  infarction.  Study determined to be normal and low risk.  Jordan Barber is scheduled for an elective REPAIR, HERNIA, INGUINAL, ADULT (Left: Groin) on 12/29/2023 with Dr. Lucas Sjogren, MD. Given patient's past medical history significant for cardiovascular diagnoses, presurgical cardiac clearance was sought by the PAT team. Per cardiology, this patient is optimized for surgery and may proceed with the planned procedural course with a MODERATE risk of significant perioperative cardiovascular complications.  In review of the patient's chart, it is noted that he is on daily oral antithrombotic therapy. He has been instructed on recommendations for holding his daily low-dose ASA for 5 days prior to his procedure with plans to restart as soon as postoperative bleeding risk felt to be minimized by his primary attending surgeon. The patient has been instructed that his last dose of should be on 12/23/2023.[name]  Patient denies previous perioperative complications with anesthesia in the past. In review his EMR, it is noted that patient underwent a general anesthetic course here at [facility] ([facility]) in 01/2021 without documented complications.   MOST RECENT VITAL SIGNS:    12/14/2023    1:07 PM 12/14/2023    1:06 PM 12/14/2023    1:04 PM  Vitals with BMI  Systolic 123 149 873  Diastolic 76 80 77  Pulse 71 81 83   PROVIDERS/SPECIALISTS: NOTE: Primary physician provider listed below. Patient may have been seen by APP or partner within same practice.   PROVIDER ROLE / SPECIALTY LAST Jordan Sjogren Lucas, MD General Surgery (Surgeon) 11/23/2023  Donzella Lauraine SAILOR, DO Primary Care Provider 11/30/2023  Florencio Shine, MD Cardiology 12/20/2023   ALLERGIES: Allergies  Allergen Reactions   Amoxicillin Diarrhea    CURRENT HOME MEDICATIONS: No current facility-administered medications for this encounter.    albuterol  (VENTOLIN  HFA) 108 (90 Base) MCG/ACT  inhaler   amLODipine  (NORVASC )  2.5 MG tablet   aspirin  EC 81 MG EC tablet   atorvastatin  (LIPITOR) 40 MG tablet   clobetasol  (TEMOVATE ) 0.05 % external solution   clobetasol  ointment (TEMOVATE ) 0.05 %   fluticasone  (FLONASE ) 50 MCG/ACT nasal spray   sildenafil (REVATIO) 20 MG tablet   TRELEGY ELLIPTA  100-62.5-25 MCG/ACT AEPB   HISTORY: Past Medical History:  Diagnosis Date   Acute blood loss anemia (ABLA)    a.) following hip surgery 01/2021   Acute respiratory failure with hypoxia (HCC) 02/10/2021   a.) following hip surgery in 01/2021   Aortic atherosclerosis (HCC)    Aortic atherosclerosis (HCC)    Bilateral carotid artery disease (HCC)    Bilateral renal cysts    CAD (coronary artery disease)    Cerebral microvascular disease    Cervical spinal stenosis    Cervical spondylosis    COPD (chronic obstructive pulmonary disease) (HCC)    Diastolic dysfunction    Dyspnea    Emphysema of lung (HCC)    Erectile dysfunction    a.) on PDE5i (sildenafil)   History of acute respiratory failure    HLD (hyperlipidemia)    Hypertension    Lacunar cerebrovascular accident (CVA) (HCC)    a.) noted on brain MRI 04/19/2021; LEFT thalamocapsular junction   LAFB (left anterior fascicular block)    Left inguinal hernia    Long-term use of aspirin  therapy    New onset left bundle branch block (LBBB) 11/30/2023   a.) noted on preoperative ECG on 11/30/2023   Prostate cancer [facility])    a.) s/p radical prostatectomy 2011   Pulmonary hypertension (HCC)    Pulmonary nodules    Ramsay Hunt syndrome (geniculate herpes zoster)    Supraumbilical hernia    TIA (transient ischemic attack) 03/2021   Tobacco abuse    Past Surgical History:  Procedure Laterality Date   COLONOSCOPY  10/18/2006   COLONOSCOPY WITH PROPOFOL  N/A 02/03/2017   Procedure: COLONOSCOPY WITH PROPOFOL ;  Surgeon: Dessa Reyes ORN, MD;  Location: [facility] ENDOSCOPY;  Service: Endoscopy;  Laterality: N/A;   FRACTURE SURGERY  2022  & 2016   Tibia, Hip, Wrist   INTRAMEDULLARY (IM) NAIL INTERTROCHANTERIC Right 02/09/2021   Procedure: INTRAMEDULLARY (IM) NAIL INTERTROCHANTRIC;  Surgeon: Edie Norleen PARAS, MD;  Location: [facility] ORS;  Service: Orthopedics;  Laterality: Right;   OPEN REDUCTION INTERNAL FIXATION (ORIF) DISTAL RADIAL FRACTURE Right 02/09/2021   Procedure: OPEN REDUCTION INTERNAL FIXATION (ORIF) DISTAL RADIAL FRACTURE;  Surgeon: Edie Norleen PARAS, MD;  Location: [facility] ORS;  Service: Orthopedics;  Laterality: Right;   ORIF TIBIA PLATEAU Left 11/29/2015   Procedure: OPEN REDUCTION INTERNAL FIXATION (ORIF) TIBIAL PLATEAU;  Surgeon: Norleen PARAS Edie, MD;  Location: [facility] ORS;  Service: Orthopedics;  Laterality: Left;   PROSTATECTOMY N/A 2011   Family History  Problem Relation Age of Onset   Dementia Mother    Heart disease Father    Alcohol abuse Sister    Social History   Tobacco Use   Smoking status: Former    Current packs/day: 0.00    Average packs/day: 1 pack/day for 50.0 years (50.0 ttl pk-yrs)    Types: Cigarettes    Start date: 02/09/1971    Quit date: 02/08/2021    Years since quitting: 2.8   Smokeless tobacco: Never  Substance Use Topics   Alcohol use: Yes    Comment: maybe a 6 pack beer per year, bourbon maybe 2 times a year   LABS:  Lab Results  Component Value Date  WBC 8.8 11/22/2023   HGB 14.2 11/22/2023   HCT 41.5 11/22/2023   MCV 97.2 11/22/2023   PLT 195 11/22/2023   Lab Results  Component Value Date   NA 132 (L) 11/22/2023   CL 104 11/22/2023   K 3.9 11/22/2023   CO2 21 (L) 11/22/2023   BUN 13 11/22/2023   CREATININE 0.78 11/22/2023   GFRNONAA >60 11/22/2023   CALCIUM  9.0 11/22/2023   ALBUMIN 3.9 11/22/2023   GLUCOSE 102 (H) 11/22/2023    ECG: Date: 12/01/2023  Time ECG obtained: 1516 PM Rate: 73 bpm Rhythm: Sinus rhythm with first-degree AV block and occasional PVCs; LBBB Axis (leads I and aVF): left Intervals: PR 220 ms. QRS 150 ms. QTc 464 ms. ST segment and T wave  changes: No evidence of acute T wave abnormalities or significant ST segment elevation or depression.  Evidence of a possible, age undetermined, prior infarct:  No Comparison: Similar to previous tracing obtained on 11/30/2023   IMAGING / PROCEDURES: NM PET CT CARDIAC PERFUSION MULTI W/ABSOLUTE BLOODFLOW performed on 12/14/2023 Rest left ventricular function is normal. Rest EF: 51%. Stress left ventricular function is normal. Stress EF: 59%. Myocardial blood flow was computed to be 1.51ml/g/min at rest and 2.10ml/g/min at stress. Global myocardial blood flow reserve was 2.10 and was normal. Coronary calcium  was present on the attenuation correction CT images. Mild coronary calcifications were present. Coronary calcifications were present in the left anterior descending artery, left circumflex artery and right coronary artery distribution(s). Normal left ventricular perfusion without evidence of ischemia or infarction. Normal low risk study.  TRANSTHORACIC ECHOCARDIOGRAM performed on 12/07/2023 Low normal left ventricular systolic function with an EF of 50% Mild LVH No regional wall motion abnormalities Left ventricular diastolic Doppler parameters consistent with abnormal relaxation (G1DD). Normal right ventricular size and function Trivial MR and PR Mild TR RVSP = 46 mmHg Normal gradients; no valvular stenosis  MYOCARDIAL PERFUSION IMAGING STUDY (LEXISCAN ) performed on 12/07/2023 EKG nondiagnostic due to left bundle branch block.  SPECT images demonstrate moderate intensity, minimally reversible perfusion defect in entire inferior and septal wall.  This can be secondary to diaphragmatic attenuation artifact along with left bundle branch block but cannot rule out a competent of ischemia, sum difference score 2.  Left ventricle normal size with mildly reduced LVEF of 45%.  Stress test is probably abnormal.  Poor study quality with diaphragmatic attenuation, LBBB.  Consider alternate  stress imaging like PET/CT stress test for further evaluation.   CT ABDOMEN PELVIS W CONTRAST performed on 11/22/2023 Small fat containing left inguinal and supraumbilical midline ventral hernias. No bowel herniation. No acute intra-abdominal or intrapelvic process. Aortic atherosclerosis  CT CHEST LUNG CA SCREEN LOW DOSE W/O CM performed on 06/18/2023 Lung-RADS 2, benign appearance or behavior. Continue annual screening with low-dose chest CT without contrast in 12 months. Three-vessel coronary atherosclerosis. Aortic atherosclerosis  Emphysema   MR BRAIN WO CONTRAST performed on 04/19/2021 Mildly motion degraded exam. No evidence of acute intracranial abnormality. Moderate chronic small vessel ischemic changes within the cerebral white matter and pons, progressed from the brain MRI of 12/02/2017. 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 Prominent perivascular space versus chronic lacunar infarct within the left caudate head, not definitively present on the prior MRI. Mild generalized cerebral atrophy. Paranasal sinus disease, as described. Correlate for acute on chronic sinusitis. Partially imaged polypoid soft tissue within the left nasal passage. Direct visualization recommended. Trace fluid within the right mastoid air cells.  CT ANGIO HEAD  NECK W WO CM performed on 04/19/2021 The common carotid and internal carotid arteries are patent within the neck. Atherosclerotic narrowing of the proximal right ICA of up to 30-40%. Atherosclerotic narrowing of the proximal left ICA of up to 50%. Vertebral arteries patent within the neck. Sites of up to 50% stenosis within the V2 vertebral arteries bilaterally due to mass effect from degenerative osseous spurring within the cervical spine Advanced cervical spondylosis. Notably, there is multifactorial severe spinal canal stenosis at C4-C5, C5-C6 and C6-C7. Multilevel bony neural foraminal narrowing. Reversal  of the expected cervical lordosis. Mild multilevel grade 1 spondylolisthesis, as detailed. Aortic atherosclerosis  Emphysema  No intracranial large vessel occlusion or proximal high-grade arterial stenosis. Nonstenotic calcified plaque within the intracranial internal carotid arteries. Atherosclerotic irregularity of the posterior cerebral arteries. Most notably, there is a site of moderate stenosis within the right PCA at the P2/P3 junction.  IMPRESSION AND PLAN: Harlyn Italiano Mahlum has been referred for pre-anesthesia review and clearance prior to him undergoing the planned anesthetic and procedural courses. Available labs, pertinent testing, and imaging results were personally reviewed by me in preparation for upcoming operative/procedural course. [facility] medical record has been updated following extensive record review and patient interview with PAT staff.   This patient has been appropriately cleared by cardiology with an overall MODERATE risk of patient experiencing significant perioperative cardiovascular complications. Based on clinical review performed today (12/28/23), barring any significant acute changes in the patient's overall condition, it is anticipated that he will be able to proceed with the planned surgical intervention. Any acute changes in clinical condition may necessitate his procedure being postponed and/or cancelled. Patient will meet with anesthesia team (MD and/or CRNA) on the day of his procedure for preoperative evaluation/assessment. Questions regarding anesthetic course will be fielded at that time.   Pre-surgical instructions were reviewed with the patient during his PAT appointment, and questions were fielded to satisfaction by PAT clinical staff. He has been instructed on which medications that he will need to hold prior to surgery, as well as the ones that have been deemed safe/appropriate to take on the day of his procedure. As part of the general education provided by  PAT, patient made aware both verbally and in writing, that he would need to abstain from the use of any illegal substances during his perioperative course. He was advised that failure to follow the provided instructions could necessitate case cancellation or result in serious perioperative complications up to and including death. Patient encouraged to contact PAT and/or his surgeon's office to discuss any questions or concerns that may arise prior to surgery; verbalized understanding.   Dorise Pereyra, MSN, APRN, FNP-C, CEN [facility]  Perioperative Services Nurse Practitioner Phone: [phone] Fax: [phone] 12/28/23 10:13 AM  NOTE: This note has been prepared using Dragon dictation software. Despite my best ability to proofread, there is always the potential that unintentional transcriptional errors may still occur from this process.

## 2023-12-29 ENCOUNTER — Other Ambulatory Visit: Payer: Self-pay

## 2023-12-29 ENCOUNTER — Ambulatory Visit
Admission: RE | Admit: 2023-12-29 | Discharge: 2023-12-29 | Disposition: A | Discharge status: Home / Self Care | Attending: General Surgery | Admitting: General Surgery

## 2023-12-29 ENCOUNTER — Ambulatory Visit: Payer: Self-pay | Admitting: Urgent Care

## 2023-12-29 ENCOUNTER — Encounter: Payer: Self-pay | Admitting: General Surgery

## 2023-12-29 ENCOUNTER — Encounter
Admission: RE | Disposition: A | Discharge status: Home / Self Care | Payer: Self-pay | Source: Home / Self Care | Attending: General Surgery

## 2023-12-29 DIAGNOSIS — I447 Left bundle-branch block, unspecified: Secondary | ICD-10-CM | POA: Insufficient documentation

## 2023-12-29 DIAGNOSIS — J9611 Chronic respiratory failure with hypoxia: Secondary | ICD-10-CM | POA: Insufficient documentation

## 2023-12-29 DIAGNOSIS — E785 Hyperlipidemia, unspecified: Secondary | ICD-10-CM | POA: Insufficient documentation

## 2023-12-29 DIAGNOSIS — I2510 Atherosclerotic heart disease of native coronary artery without angina pectoris: Secondary | ICD-10-CM | POA: Insufficient documentation

## 2023-12-29 DIAGNOSIS — Z87891 Personal history of nicotine dependence: Secondary | ICD-10-CM | POA: Diagnosis not present

## 2023-12-29 DIAGNOSIS — G4730 Sleep apnea, unspecified: Secondary | ICD-10-CM | POA: Insufficient documentation

## 2023-12-29 DIAGNOSIS — I119 Hypertensive heart disease without heart failure: Secondary | ICD-10-CM | POA: Diagnosis not present

## 2023-12-29 DIAGNOSIS — N529 Male erectile dysfunction, unspecified: Secondary | ICD-10-CM | POA: Diagnosis not present

## 2023-12-29 DIAGNOSIS — K4090 Unilateral inguinal hernia, without obstruction or gangrene, not specified as recurrent: Secondary | ICD-10-CM | POA: Diagnosis not present

## 2023-12-29 DIAGNOSIS — Z9079 Acquired absence of other genital organ(s): Secondary | ICD-10-CM | POA: Diagnosis not present

## 2023-12-29 DIAGNOSIS — J439 Emphysema, unspecified: Secondary | ICD-10-CM | POA: Diagnosis not present

## 2023-12-29 DIAGNOSIS — Z8673 Personal history of transient ischemic attack (TIA), and cerebral infarction without residual deficits: Secondary | ICD-10-CM | POA: Insufficient documentation

## 2023-12-29 DIAGNOSIS — I10 Essential (primary) hypertension: Secondary | ICD-10-CM | POA: Diagnosis not present

## 2023-12-29 DIAGNOSIS — I272 Pulmonary hypertension, unspecified: Secondary | ICD-10-CM | POA: Diagnosis not present

## 2023-12-29 HISTORY — DX: Spondylosis without myelopathy or radiculopathy, cervical region: M47.812

## 2023-12-29 HISTORY — DX: Pulmonary hypertension, unspecified: I27.20

## 2023-12-29 HISTORY — PX: INGUINAL HERNIA REPAIR: SHX194

## 2023-12-29 HISTORY — DX: Long term (current) use of aspirin: Z79.82

## 2023-12-29 SURGERY — REPAIR, HERNIA, INGUINAL, ADULT
Anesthesia: General | Site: Groin | Laterality: Left

## 2023-12-29 MED ORDER — FENTANYL CITRATE (PF) 100 MCG/2ML IJ SOLN
INTRAMUSCULAR | Status: AC
  Filled 2023-12-29: qty 2

## 2023-12-29 MED ORDER — 0.9 % SODIUM CHLORIDE (POUR BTL) OPTIME
TOPICAL | Status: DC | PRN
  Administered 2023-12-29: 1000 mL

## 2023-12-29 MED ORDER — ACETAMINOPHEN 10 MG/ML IV SOLN
INTRAVENOUS | Status: AC
  Filled 2023-12-29: qty 100

## 2023-12-29 MED ORDER — ACETAMINOPHEN 10 MG/ML IV SOLN
INTRAVENOUS | Status: DC | PRN
  Administered 2023-12-29: 1000 mg via INTRAVENOUS

## 2023-12-29 MED ORDER — DEXAMETHASONE SODIUM PHOSPHATE 10 MG/ML IJ SOLN
INTRAMUSCULAR | Status: DC | PRN
  Administered 2023-12-29: 10 mg via INTRAVENOUS

## 2023-12-29 MED ORDER — FENTANYL CITRATE (PF) 100 MCG/2ML IJ SOLN: 25.0000 ug | INTRAMUSCULAR | Status: DC | PRN

## 2023-12-29 MED ORDER — IPRATROPIUM-ALBUTEROL 0.5-2.5 (3) MG/3ML IN SOLN: 3.0000 mL | RESPIRATORY_TRACT | Status: DC

## 2023-12-29 MED ORDER — IPRATROPIUM-ALBUTEROL 0.5-2.5 (3) MG/3ML IN SOLN
3.0000 mL | Freq: Once | RESPIRATORY_TRACT | Status: AC
  Administered 2023-12-29: 3 mL via RESPIRATORY_TRACT

## 2023-12-29 MED ORDER — ROCURONIUM BROMIDE 100 MG/10ML IV SOLN
INTRAVENOUS | Status: DC | PRN
  Administered 2023-12-29: 50 mg via INTRAVENOUS
  Administered 2023-12-29: 10 mg via INTRAVENOUS

## 2023-12-29 MED ORDER — CHLORHEXIDINE GLUCONATE 0.12 % MT SOLN
OROMUCOSAL | Status: AC
  Filled 2023-12-29: qty 15

## 2023-12-29 MED ORDER — IPRATROPIUM-ALBUTEROL 0.5-2.5 (3) MG/3ML IN SOLN
RESPIRATORY_TRACT | Status: AC
Start: 2023-12-29 — End: 2023-12-29
  Filled 2023-12-29: qty 3

## 2023-12-29 MED ORDER — BUPIVACAINE-EPINEPHRINE (PF) 0.25% -1:200000 IJ SOLN
INTRAMUSCULAR | Status: AC
  Filled 2023-12-29: qty 30

## 2023-12-29 MED ORDER — MIDAZOLAM HCL 2 MG/2ML IJ SOLN
INTRAMUSCULAR | Status: DC | PRN
  Administered 2023-12-29: 2 mg via INTRAVENOUS

## 2023-12-29 MED ORDER — CEFAZOLIN SODIUM-DEXTROSE 2-4 GM/100ML-% IV SOLN
2.0000 g | INTRAVENOUS | Status: AC
  Administered 2023-12-29: 2 g via INTRAVENOUS

## 2023-12-29 MED ORDER — FENTANYL CITRATE (PF) 100 MCG/2ML IJ SOLN
INTRAMUSCULAR | Status: DC | PRN
  Administered 2023-12-29 (×2): 50 ug via INTRAVENOUS

## 2023-12-29 MED ORDER — HYDROCODONE-ACETAMINOPHEN 5-325 MG PO TABS
1.0000 | ORAL_TABLET | Freq: Four times a day (QID) | ORAL | 0 refills | Status: AC | PRN
  Filled 2023-12-29: qty 12, 3d supply, fill #0

## 2023-12-29 MED ORDER — ORAL CARE MOUTH RINSE: 15.0000 mL | Freq: Once | OROMUCOSAL | Status: AC

## 2023-12-29 MED ORDER — KETOROLAC TROMETHAMINE 30 MG/ML IJ SOLN
INTRAMUSCULAR | Status: DC | PRN
  Administered 2023-12-29: 30 mg via INTRAVENOUS

## 2023-12-29 MED ORDER — DEXMEDETOMIDINE HCL IN NACL 200 MCG/50ML IV SOLN
INTRAVENOUS | Status: DC | PRN
  Administered 2023-12-29: 12 ug via INTRAVENOUS

## 2023-12-29 MED ORDER — SUGAMMADEX SODIUM 200 MG/2ML IV SOLN
INTRAVENOUS | Status: DC | PRN
  Administered 2023-12-29: 200 mg via INTRAVENOUS

## 2023-12-29 MED ORDER — GLYCOPYRROLATE 0.2 MG/ML IJ SOLN
INTRAMUSCULAR | Status: DC | PRN
  Administered 2023-12-29: .2 mg via INTRAVENOUS

## 2023-12-29 MED ORDER — LIDOCAINE HCL (CARDIAC) PF 100 MG/5ML IV SOSY
PREFILLED_SYRINGE | INTRAVENOUS | Status: DC | PRN
  Administered 2023-12-29: 100 mg via INTRAVENOUS

## 2023-12-29 MED ORDER — ONDANSETRON HCL 4 MG/2ML IJ SOLN
INTRAMUSCULAR | Status: DC | PRN
  Administered 2023-12-29 (×2): 4 mg via INTRAVENOUS

## 2023-12-29 MED ORDER — CEFAZOLIN SODIUM-DEXTROSE 2-4 GM/100ML-% IV SOLN
INTRAVENOUS | Status: AC
  Filled 2023-12-29: qty 100

## 2023-12-29 MED ORDER — LACTATED RINGERS IV SOLN: INTRAVENOUS | Status: DC

## 2023-12-29 MED ORDER — OXYCODONE HCL 5 MG PO TABS: 5.0000 mg | ORAL_TABLET | Freq: Once | ORAL | Status: DC | PRN

## 2023-12-29 MED ORDER — CHLORHEXIDINE GLUCONATE 0.12 % MT SOLN
15.0000 mL | Freq: Once | OROMUCOSAL | Status: AC
  Administered 2023-12-29: 15 mL via OROMUCOSAL

## 2023-12-29 MED ORDER — OXYCODONE HCL 5 MG/5ML PO SOLN: 5.0000 mg | Freq: Once | ORAL | Status: DC | PRN

## 2023-12-29 MED ORDER — MIDAZOLAM HCL 2 MG/2ML IJ SOLN
INTRAMUSCULAR | Status: AC
  Filled 2023-12-29: qty 2

## 2023-12-29 MED ORDER — PROPOFOL 10 MG/ML IV BOLUS
INTRAVENOUS | Status: DC | PRN
  Administered 2023-12-29: 120 mg via INTRAVENOUS

## 2023-12-29 MED ORDER — BUPIVACAINE-EPINEPHRINE 0.25% -1:200000 IJ SOLN
INTRAMUSCULAR | Status: DC | PRN
  Administered 2023-12-29: 30 mL

## 2023-12-29 SURGICAL SUPPLY — 26 items
BLADE SURG 15 STRL LF DISP TIS (BLADE) ×1 IMPLANT
CHLORAPREP W/TINT 26 (MISCELLANEOUS) IMPLANT
DERMABOND ADVANCED .7 DNX12 (GAUZE/BANDAGES/DRESSINGS) ×1 IMPLANT
DRAIN PENROSE 12X.25 LTX STRL (MISCELLANEOUS) ×1 IMPLANT
DRAPE LAPAROTOMY 100X77 ABD (DRAPES) ×1 IMPLANT
ELECTRODE REM PT RTRN 9FT ADLT (ELECTROSURGICAL) ×1 IMPLANT
GAUZE 4X4 16PLY ~~LOC~~+RFID DBL (SPONGE) IMPLANT
GLOVE BIO SURGEON STRL SZ 6.5 (GLOVE) ×2 IMPLANT
GLOVE BIOGEL PI IND STRL 6.5 (GLOVE) ×1 IMPLANT
GLOVE SURG SYN 6.5 PF PI (GLOVE) ×2 IMPLANT
GOWN STRL REUS W/ TWL LRG LVL3 (GOWN DISPOSABLE) ×3 IMPLANT
LABEL OR SOLS (LABEL) ×1 IMPLANT
MANIFOLD NEPTUNE II (INSTRUMENTS) ×1 IMPLANT
MESH HERNIA 6X13 (Mesh General) IMPLANT
NDL HYPO 22X1.5 SAFETY MO (MISCELLANEOUS) ×1 IMPLANT
NEEDLE HYPO 22X1.5 SAFETY MO (MISCELLANEOUS) ×1 IMPLANT
NS IRRIG 500ML POUR BTL (IV SOLUTION) ×1 IMPLANT
PACK BASIN MINOR ARMC (MISCELLANEOUS) ×1 IMPLANT
SUT SURGILON 0 BLK (SUTURE) ×2 IMPLANT
SUT VIC AB 2-0 BRD 54 (SUTURE) ×1 IMPLANT
SUT VIC AB 2-0 CT2 27 (SUTURE) ×1 IMPLANT
SUT VIC AB 3-0 SH 27X BRD (SUTURE) ×2 IMPLANT
SUTURE MNCRL 4-0 27XMF (SUTURE) ×1 IMPLANT
SYR 10ML LL (SYRINGE) ×1 IMPLANT
TRAP FLUID SMOKE EVACUATOR (MISCELLANEOUS) ×1 IMPLANT
WATER STERILE IRR 500ML POUR (IV SOLUTION) ×1 IMPLANT

## 2023-12-29 NOTE — Anesthesia Postprocedure Evaluation (Signed)
 Anesthesia Post Note  Patient: Jordan Barber  Procedure(s) Performed: REPAIR, HERNIA, INGUINAL, ADULT (Left: Groin)  Patient location during evaluation: PACU Anesthesia Type: General Level of consciousness: awake and alert Pain management: pain level controlled Vital Signs Assessment: post-procedure vital signs reviewed and stable Respiratory status: spontaneous breathing, nonlabored ventilation and respiratory function stable Cardiovascular status: blood pressure returned to baseline and stable Postop Assessment: no apparent nausea or vomiting Anesthetic complications: no   No notable events documented.   Last Vitals:  Vitals:   12/29/23 1430 12/29/23 1441  BP: 128/64 126/64  Pulse: 78 82  Resp: 17 17  Temp:  (!) 36.2 C  SpO2: 91% 93%    Last Pain:  Vitals:   12/29/23 1441  TempSrc: Temporal  PainSc: 3                  Fairy POUR Wynona Duhamel

## 2023-12-29 NOTE — Transfer of Care (Signed)
 Immediate Anesthesia Transfer of Care Note  Patient: Jordan Barber  Procedure(s) Performed: REPAIR, HERNIA, INGUINAL, ADULT (Left: Groin)  Patient Location: PACU  Anesthesia Type:General  Level of Consciousness: drowsy and patient cooperative  Airway & Oxygen Therapy: Patient Spontanous Breathing and Patient connected to face mask oxygen  Post-op Assessment: Report given to RN and Post -op Vital signs reviewed and stable  Post vital signs: Reviewed and stable  Last Vitals:  Vitals Value Taken Time  BP 142/80 12/29/23 12:12  Temp 36.3 C 12/29/23 12:13  Pulse 66 12/29/23 12:14  Resp 22 12/29/23 12:14  SpO2 100 % 12/29/23 12:14  Vitals shown include unfiled device data.  Last Pain:  Vitals:   12/29/23 0924  PainSc: 0-No pain         Complications: No notable events documented.

## 2023-12-29 NOTE — Discharge Instructions (Signed)

## 2023-12-29 NOTE — Op Note (Signed)
 Preoperative diagnosis: Left Inguinal Hernia.  Postoperative diagnosis: Left Indirect Inguinal Hernia.  Procedure: Left Inguinal hernia repair with mesh  Anesthesia: General  Surgeon: Dr. Cesar Coe  Wound Classification: Clean  Indications:  Patient is a 78 y.o. male developed a symptomatic left inguinal hernia. Repair was indicated to avoid complications of incarceration, obstruction and pain, and a prosthetic mesh repair was elected.  Findings: 1. Vas Deferens and cord structures identified and preserved 2. An indirect inguinal hernia was identified 3.  Bard preshaped large mesh used for repair 4. Adequate hemostasis achieved  Description of procedure: The patient was taken to the operating room. A time-out was completed verifying correct patient, procedure, site, positioning, and implant(s) and/or special equipment prior to beginning this procedure. spinal anesthesia was induced. The left groin was prepped and draped in the usual sterile fashion. An incision was marked in a natural skin crease and planned to end near the pubic tubercle.  The skin crease incision was made with a knife and deepened through Scarpa's and Camper's fascia with electrocautery until the aponeurosis of the external oblique was encountered. This was cleaned and the external ring was exposed. Hemostasis was achieved in the wound. An incision was made in the midportion of the external oblique aponeurosis in the direction of its fibers. The ilioinguinal nerve was identified and protected throughout the dissection. Flaps of the external oblique were developed cephalad and inferiorly.  The cord was identified. It was gently dissected free at the pubic tubercle and encircled with a Penrose drain. Attention was directed to the anteromedial aspect of the cord, where an indirect hernia sac was identified. The sac was carefully dissected free of the cord down to the level of the internal ring. The vas and testicular vessels  were identified and protected from harm. The sac was opened and contents were reduced. A finger was passed into the peritoneal cavity and the floor of the inguinal canal assessed and found to be strong. The femoral canal was palpated and no hernia identified. The sac was twisted and suture ligated with 2-0 silk. The sac was checked for hemostasis and allowed to retract into the abdomen.  Attention then turned to the floor of the canal, which appeared to be grossly weakened without a well-defined defect or sac. The Bard preshaped large mesh was inserted. Beginning at the pubic tubercle, the mesh was sutured to the inguinal ligament inferiorly and the conjoint tendon superiorly using interrupted 0 nonabsorbable sutures. Care was taken to assure that the mesh was placed in a relaxed fashion to avoid excessive tension and that no neurovascular structures were caught in the repair. Laterally, the tails of the mesh were crossed and the internal ring recreated.  Hemostasis was again checked. The Penrose drain was removed. The external oblique aponeurosis was closed with a running suture of 3-0 Vicryl, taking care not to catch the ilioinguinal nerve in the suture line. Scarpa's fascia was closed with interrupted 3-0 Vicryl.  The skin was closed with a subcuticular stitch of Monocryl 4-0. Dermabond was applied.  The testis was gently pulled down into its anatomic position in the scrotum.  The patient tolerated the procedure well and was taken to the postanesthesia care unit in stable condition.   Specimen: Hernia sac  Complications: None  Estimated Blood Loss: 10 mL

## 2023-12-29 NOTE — Anesthesia Preprocedure Evaluation (Addendum)
 Anesthesia Evaluation  Patient identified by MRN, date of birth, ID band Patient awake    Reviewed: Allergy & Precautions, NPO status , Patient's Chart, lab work & pertinent test results  History of Anesthesia Complications (+) Emergence Delirium and history of anesthetic complications  Airway Mallampati: III  TM Distance: >3 FB Neck ROM: full    Dental  (+) Chipped   Pulmonary shortness of breath and with exertion, sleep apnea , COPD, former smoker    + decreased breath sounds      Cardiovascular Exercise Tolerance: Good hypertension, (-) angina + CAD  Normal cardiovascular exam+ dysrhythmias      Neuro/Psych TIA Neuromuscular disease  negative psych ROS   GI/Hepatic negative GI ROS, Neg liver ROS,neg GERD  ,,  Endo/Other  negative endocrine ROS    Renal/GU Renal disease     Musculoskeletal   Abdominal   Peds  Hematology negative hematology ROS (+)   Anesthesia Other Findings Past Medical History: No date: Acute blood loss anemia (ABLA)     Comment:  a.) following hip surgery 01/2021 02/10/2021: Acute respiratory failure with hypoxia (HCC)     Comment:  a.) following hip surgery in 01/2021 No date: Aortic atherosclerosis (HCC) No date: Aortic atherosclerosis (HCC) No date: Bilateral carotid artery disease (HCC) No date: Bilateral renal cysts No date: CAD (coronary artery disease) No date: Cerebral microvascular disease No date: Cervical spinal stenosis No date: Cervical spondylosis No date: COPD (chronic obstructive pulmonary disease) (HCC) No date: Diastolic dysfunction No date: Dyspnea No date: Emphysema of lung (HCC) No date: Erectile dysfunction     Comment:  a.) on PDE5i (sildenafil) No date: History of acute respiratory failure No date: HLD (hyperlipidemia) No date: Hypertension No date: Lacunar cerebrovascular accident (CVA) (HCC)     Comment:  a.) noted on brain MRI 04/19/2021; LEFT  thalamocapsular               junction No date: LAFB (left anterior fascicular block) No date: Left inguinal hernia No date: Long-term use of aspirin  therapy 11/30/2023: New onset left bundle branch block (LBBB)     Comment:  a.) noted on preoperative ECG on 11/30/2023 No date: Prostate cancer (HCC)     Comment:  a.) s/p radical prostatectomy 2011 No date: Pulmonary hypertension (HCC) No date: Pulmonary nodules No date: Ramsay Hunt syndrome (geniculate herpes zoster) No date: Supraumbilical hernia 03/2021: TIA (transient ischemic attack) No date: Tobacco abuse  Past Surgical History: 10/18/2006: COLONOSCOPY 02/03/2017: COLONOSCOPY WITH PROPOFOL ; N/A     Comment:  Procedure: COLONOSCOPY WITH PROPOFOL ;  Surgeon: Dessa Reyes ORN, MD;  Location: [facility];  Service:               Endoscopy;  Laterality: N/A; 2022 & 2016: FRACTURE SURGERY     Comment:  Tibia, Hip, Wrist 02/09/2021: INTRAMEDULLARY (IM) NAIL INTERTROCHANTERIC; Right     Comment:  Procedure: INTRAMEDULLARY (IM) NAIL INTERTROCHANTRIC;                Surgeon: Edie Norleen PARAS, MD;  Location: [facility];                Service: Orthopedics;  Laterality: Right; 02/09/2021: OPEN REDUCTION INTERNAL FIXATION (ORIF) DISTAL RADIAL  FRACTURE; Right     Comment:  Procedure: OPEN REDUCTION INTERNAL FIXATION (ORIF)               DISTAL RADIAL FRACTURE;  Surgeon: Edie,  Norleen PARAS, MD;                Location: [facility] OR[facility];  Service: Orthopedics;  Laterality:               Right; 11/29/2015: ORIF TIBIA PLATEAU; Left     Comment:  Procedure: OPEN REDUCTION INTERNAL FIXATION (ORIF)               TIBIAL PLATEAU;  Surgeon: Norleen PARAS Maltos, MD;  Location:               [facility] OR[facility];  Service: Orthopedics;  Laterality: Left; 2011: PROSTATECTOMY; N/A  BMI    Body Mass Index: 24.14 kg/m      Reproductive/Obstetrics negative OB ROS                              Anesthesia Physical Anesthesia Plan  ASA:  3  Anesthesia Plan: General ETT   Post-op Pain Management:    Induction: Intravenous  PONV Risk Score and Plan: Ondansetron , Dexamethasone , Midazolam  and Treatment may vary due to age or medical condition  Airway Management Planned: Oral ETT  Additional Equipment:   Intra-op Plan:   Post-operative Plan: Extubation in OR  Informed Consent: I have reviewed the patients History and Physical, chart, labs and discussed the procedure including the risks, benefits and alternatives for the proposed anesthesia with the patient or authorized representative who has indicated his/her understanding and acceptance.     Dental Advisory Given  Plan Discussed with: Anesthesiologist, CRNA and Surgeon  Anesthesia Plan Comments: (Patient consented for risks of anesthesia including but not limited to:  - adverse reactions to medications - damage to eyes, teeth, lips or other oral mucosa - nerve damage due to positioning  - sore throat or hoarseness - Damage to heart, brain, nerves, lungs, other parts of body or loss of life  Patient voiced understanding and assent.)         Anesthesia Quick Evaluation

## 2023-12-29 NOTE — H&P (Signed)
 History of Present Illness Jordan Barber is a 78 year old male who presents with left groin pain due to an inguinal hernia.  He has been experiencing left groin pain, which led to a visit to the emergency room yesterday. A CT scan performed at that time revealed a left inguinal hernia containing fat. I personally evaluated the images.  He first noticed swelling in the left groin area on Saturday, which became painful after coughing and sneezing. The pain improves when he avoids these actions. He describes the pain as 'very painful' when it occurs. Pain radiates to the left testicle.  He has a history of robotic prostatectomy.  He quit smoking after breaking his hip and has gained approximately 70 pounds over the last three years.   PAST MEDICAL HISTORY:  Past Medical History:  Diagnosis Date  Chickenpox  Malignant neoplasm of prostate (CMS/HHS-HCC) 02/29/2012     PAST SURGICAL HISTORY:  Past Surgical History:  Procedure Laterality Date  Laparoscopic pelvic lymph node sampling. Laparoscopic prostatectomy robotic. 08/26/2010  open reduction and internal fixation of displaced split compression fracture, left lateral tibial plateau Left 11/29/2015  Dr.Poggi  1. Reduction and internal fixation of displaced intertrochanteric right hip fracture with [employer] TFN nail. 2. Open reduction and internal fixation of displaced intra-articular right distal radius fracture Right 02/09/2021  Dr.Poggi    MEDICATIONS:  Outpatient Encounter Medications as of 11/23/2023  Medication Sig Dispense Refill  amLODIPine  (NORVASC ) 2.5 MG tablet Take 1 tablet by mouth once daily  aspirin  81 MG EC tablet Take 81 mg by mouth daily.  atorvastatin  (LIPITOR) 40 MG tablet Take 40 mg by mouth once daily  TRELEGY ELLIPTA  100-62.5-25 mcg inhaler Inhale 1 Puff into the lungs once daily  clobetasol  (CORMAX ) 0.05 % external solution Apply topically. Apply 1 application topically 2 (two) times daily. (Patient not  taking: Reported on 11/23/2023)  clobetasol  (TEMOVATE ) 0.05 % ointment Apply topically. Apply 1 application topically 2 (two) times daily. (Patient not taking: Reported on 11/23/2023)  multivitamin with iron-B12-Vitamin C-Folic Acid (FOLTRIN) 110-0.5 mg capsule Take by mouth Take 1 capsule by mouth 2 (two) times daily after a meal (Patient not taking: Reported on 11/23/2023)   No facility-administered encounter medications on file as of 11/23/2023.    ALLERGIES:  Penicillin  SOCIAL HISTORY:  Social History   Socioeconomic History  Marital status: Married  Tobacco Use  Smoking status: Former  Current packs/day: 1.00  Types: Cigarettes  Smokeless tobacco: Never  Substance and Sexual Activity  Alcohol use: No  Drug use: No  Sexual activity: Defer   Social Drivers of Corporate investment banker Strain: Low Risk (11/22/2023)  Overall Financial Resource Strain (CARDIA)  Difficulty of Paying Living Expenses: Not hard at all  Food Insecurity: No Food Insecurity (11/22/2023)  Hunger Vital Sign  Worried About Running Out of Food in the Last Year: Never true  Ran Out of Food in the Last Year: Never true  Transportation Needs: No Transportation Needs (11/22/2023)  PRAPARE - Risk analyst (Medical): No  Lack of Transportation (Non-Medical): No   FAMILY HISTORY:  Family History  Problem Relation Name Age of Onset  Dementia Mother  Myocardial Infarction (Heart attack) Father    GENERAL REVIEW OF SYSTEMS:   General ROS: negative for - chills, fatigue, fever, weight gain or weight loss Allergy and Immunology ROS: negative for - hives  Hematological and Lymphatic ROS: negative for - bleeding problems or bruising, negative for palpable nodes Endocrine ROS: negative  for - heat or cold intolerance, hair changes Respiratory ROS: negative for - cough, shortness of breath or wheezing Cardiovascular ROS: no chest pain or palpitations GI ROS: negative for nausea,  vomiting, abdominal pain, diarrhea, constipation Musculoskeletal ROS: negative for - joint swelling or muscle pain Neurological ROS: negative for - confusion, syncope Dermatological ROS: negative for pruritus and rash  PHYSICAL EXAM:  Vitals:  11/23/23 1147  BP: 129/66  Pulse: 77  .  Ht:182.9 cm (6') Wt:78 kg (172 lb) ADJ:Anib surface area is 1.99 meters squared. Body mass index is 23.33 kg/m.[name]  GENERAL: Alert, active, oriented x3  HEENT: Pupils equal reactive to light. Extraocular movements are intact. Sclera clear. Palpebral conjunctiva normal red color.Pharynx clear.  NECK: Supple with no palpable mass and no adenopathy.  LUNGS: Sound clear with no rales rhonchi or wheezes.  HEART: Regular rhythm S1 and S2 without murmur.  ABDOMEN: Soft and depressible, nontender with no palpable mass, no hepatomegaly. Left inguinal hernia, reducible but tender to palpation.  EXTREMITIES: Well-developed well-nourished symmetrical with no dependent edema.  NEUROLOGICAL: Awake alert oriented, facial expression symmetrical, moving all extremities.  Results RADIOLOGY Abdominal CT: Fat-containing left inguinal hernia (11/21/2021)  Assessment & Plan Left inguinal hernia  Acute left inguinal hernia with groin pain confirmed by CT as a fat-containing left inguinal hernia. Pain worsens with coughing and sneezing due to increased intra-abdominal pressure. Surgical intervention is necessary as conservative measures are ineffective. Schedule open surgical repair with mesh reinforcement due to previous prostate surgery precluding robotic repair. Advise on post-operative care: avoid heavy lifting for two weeks and expect groin swelling. Provide incision care instructions: skin glue will be used, no dressing changes required. Arrange for nurse to provide surgery date and instructions before discharge.  Non-recurrent unilateral inguinal hernia without obstruction or gangrene [K40.90]   Patient verbalized  understanding, all questions were answered, and were agreeable with the plan outlined above.   Lucas Sjogren, MD

## 2023-12-29 NOTE — Anesthesia Procedure Notes (Signed)
 Procedure Name: Intubation Date/Time: 12/29/2023 10:22 AM  Performed by: Ledora Duncan, CRNAPre-anesthesia Checklist: Patient identified, Emergency Drugs available, Suction available and Patient being monitored Patient Re-evaluated:Patient Re-evaluated prior to induction Oxygen Delivery Method: Circle system utilized Preoxygenation: Pre-oxygenation with 100% oxygen Induction Type: IV induction Ventilation: Mask ventilation without difficulty Laryngoscope Size: McGrath and 3 Grade View: Grade I Tube type: Oral Number of attempts: 1 Airway Equipment and Method: Stylet Placement Confirmation: ETT inserted through vocal cords under direct vision, positive ETCO2 and breath sounds checked- equal and bilateral Secured at: 21 cm Tube secured with: Tape Dental Injury: Teeth and Oropharynx as per pre-operative assessment

## 2023-12-30 ENCOUNTER — Encounter: Payer: Self-pay | Admitting: General Surgery

## 2024-01-10 DIAGNOSIS — R911 Solitary pulmonary nodule: Secondary | ICD-10-CM | POA: Diagnosis not present

## 2024-01-10 DIAGNOSIS — R0902 Hypoxemia: Secondary | ICD-10-CM | POA: Diagnosis not present

## 2024-01-10 DIAGNOSIS — J432 Centrilobular emphysema: Secondary | ICD-10-CM | POA: Diagnosis not present

## 2024-01-10 DIAGNOSIS — J301 Allergic rhinitis due to pollen: Secondary | ICD-10-CM | POA: Diagnosis not present

## 2024-01-10 DIAGNOSIS — J9601 Acute respiratory failure with hypoxia: Secondary | ICD-10-CM | POA: Diagnosis not present

## 2024-01-10 DIAGNOSIS — Z87891 Personal history of nicotine dependence: Secondary | ICD-10-CM | POA: Diagnosis not present

## 2024-01-10 DIAGNOSIS — R0609 Other forms of dyspnea: Secondary | ICD-10-CM | POA: Diagnosis not present

## 2024-01-11 ENCOUNTER — Ambulatory Visit (INDEPENDENT_AMBULATORY_CARE_PROVIDER_SITE_OTHER): Admitting: Family Medicine

## 2024-01-11 ENCOUNTER — Encounter: Payer: Self-pay | Admitting: Family Medicine

## 2024-01-11 VITALS — BP 138/80 | HR 75 | Resp 18 | Wt 182.2 lb

## 2024-01-11 DIAGNOSIS — K047 Periapical abscess without sinus: Secondary | ICD-10-CM

## 2024-01-11 DIAGNOSIS — I447 Left bundle-branch block, unspecified: Secondary | ICD-10-CM

## 2024-01-11 DIAGNOSIS — J432 Centrilobular emphysema: Secondary | ICD-10-CM | POA: Diagnosis not present

## 2024-01-11 DIAGNOSIS — I10 Essential (primary) hypertension: Secondary | ICD-10-CM

## 2024-01-11 DIAGNOSIS — I270 Primary pulmonary hypertension: Secondary | ICD-10-CM

## 2024-01-11 MED ORDER — AMLODIPINE BESYLATE 2.5 MG PO TABS: 2.5000 mg | ORAL_TABLET | Freq: Every evening | ORAL | 0 refills | Status: DC

## 2024-01-11 NOTE — Progress Notes (Signed)
 Established patient visit   Patient: Jordan Barber   DOB: Jan 22, 1946   78 y.o. Male  MRN: [id number] Visit Date: 01/11/2024  Today's healthcare provider: LAURAINE LOISE BUOY, DO   Chief Complaint  Patient presents with   Medical Management of Chronic Issues    6 weeks follow-up HTN   Subjective    HPI Jordan Barber is a 78 year old male with COPD who presents for follow-up of hypertension.  He recently saw pulmonology and feels better since starting oxygen therapy.  He notes he was able to sleep for six straight hours, which is unusual for him. He has not yet started the antibiotics prescribed for his COPD exacerbation. He has a follow-up scheduled with pulmonology and is awaiting a pulmonary function test and a six-minute walk test. He completed a chest x-ray at the pulmonologist's office, but the results are not yet available to him.  His blood pressure readings have been variable, with a recent reading of 138/80 mmHg. He recalls a high reading of 155/116 mmHg when he was struggling to breathe. He has started monitoring his blood pressure at home, noting a recent reading of 121/68 mmHg. He is currently taking amlodipine .  He uses Flonase  nasal spray as needed and has been prescribed ipratropium nasal spray as well. He is on atorvastatin , clobetasol  ointment and solution, and takes an 81 mg aspirin  every other day due to bruising and bleeding issues. He has not been taking sildenafil for pulmonary hypertension as it is not covered by insurance.  He mentions a history of pulmonary hypertension and a left bundle branch block. He has not had a recent echocardiogram. He recalls being told that his 'plumbing works fine' but he has an 'electrical problem'.  He reports two dental abscesses identified by his dentist, which are not causing him pain. He was advised that the infection might be affecting his sinuses, but he has not experienced recurrent sinus  infections.      Medications: Outpatient Medications Prior to Visit  Medication Sig   albuterol  (VENTOLIN  HFA) 108 (90 Base) MCG/ACT inhaler Inhale 2 puffs into the lungs every 6 (six) hours as needed for wheezing or shortness of breath.   aspirin  EC 81 MG EC tablet Take 1 tablet (81 mg total) by mouth daily. Swallow whole.   clobetasol  (TEMOVATE ) 0.05 % external solution Apply 1 Application topically 2 (two) times daily.   clobetasol  ointment (TEMOVATE ) 0.05 % Apply 1 Application topically 2 (two) times daily as needed (Skin problems).   ipratropium (ATROVENT) 0.03 % nasal spray Place 2 sprays into the nose.   [EXPIRED] predniSONE  (DELTASONE ) 10 MG tablet Take by mouth.   sildenafil (REVATIO) 20 MG tablet Take 20 mg by mouth daily as needed (ED).   [EXPIRED] sulfamethoxazole-trimethoprim (BACTRIM DS) 800-160 MG tablet Take by mouth.   TRELEGY ELLIPTA  100-62.5-25 MCG/ACT AEPB Inhale 1 puff into the lungs daily.   [DISCONTINUED] amLODipine  (NORVASC ) 2.5 MG tablet Take 2.5 mg by mouth at bedtime.   atorvastatin  (LIPITOR) 40 MG tablet Take 1 tablet (40 mg total) by mouth at bedtime.   fluticasone  (FLONASE ) 50 MCG/ACT nasal spray Place 2 sprays into both nostrils daily. (Patient taking differently: Place 2 sprays into both nostrils daily as needed for rhinitis or allergies.)   No facility-administered medications prior to visit.    Review of Systems  Constitutional:  Negative for chills and fever.  Respiratory:  Negative for shortness of breath (improved).  Objective    BP 138/80 (BP Location: Left Arm, Patient Position: Sitting, Cuff Size: Normal) Comment: Patient's BP cuff  Pulse 75   Resp 18   Wt 182 lb 3.2 oz (82.6 kg)   SpO2 99% Comment: on 3L oxygen  BMI 24.71 kg/m     Physical Exam Constitutional:      Appearance: Normal appearance.  HENT:     Head: Normocephalic and atraumatic.  Eyes:     General: No scleral icterus.    Extraocular Movements: Extraocular  movements intact.     Conjunctiva/sclera: Conjunctivae normal.  Cardiovascular:     Rate and Rhythm: Normal rate and regular rhythm.     Pulses: Normal pulses.     Heart sounds: Normal heart sounds.  Pulmonary:     Effort: Pulmonary effort is normal. No respiratory distress.     Breath sounds: Normal breath sounds. Decreased air movement present.  Abdominal:     Palpations: There is no mass.  Musculoskeletal:     Right lower leg: No edema.     Left lower leg: No edema.  Skin:    General: Skin is warm and dry.  Neurological:     Mental Status: He is alert and oriented to person, place, and time. Mental status is at baseline.  Psychiatric:        Mood and Affect: Mood normal.        Behavior: Behavior normal.      No results found for any visits on 01/11/24.  Assessment & Plan    Centrilobular emphysema (HCC)  Pulmonary hypertension, primary [facility]) Assessment & Plan: Noted to be mild by cardiology 12/20/23. Patient has revatio 20 mg daily PRN but has not taken in many years. Advised patient to discuss with pulmonology.   Left bundle branch block (LBBB)  Essential hypertension -     amLODIPine  Besylate; Take 1 tablet (2.5 mg total) by mouth at bedtime.  Dispense: 90 tablet; Refill: 0  Dental abscess      Centrilobular emphysema Recent COPD exacerbation managed with oxygen therapy. Improvement in sleep and breathing noted. Pending pulmonary function and six-minute walk tests. Awaiting chest x-ray results. - Advise taking prescribed prednisone  and Bactrim. - Hold Trelegy for 24 hours and albuterol  for 4 hours prior to pulmonary function test, per pulmonology instructions. - Ensure follow-up with pulmonology is scheduled.  Defer to specialist management.  Left bundle branch block Left bundle branch block new onset of 11/30/2023.  Cardiologist consulted via telemedicine.  Nuclear medicine CT perfusion study performed 12/14/2023 showed rest EF 51% and stress EF 59%, with  mild coronary calcifications noted to the left anterior descending artery, left circumflex artery and right coronary artery; study overall normal.   Essential hypertension Variable blood pressure readings with recent improvement. Continuing amlodipine . - Continue amlodipine  2.5 mg daily. - Continue home blood pressure monitoring.  Dental abscess (teeth 12 and 14) Dental abscesses in teeth 12 and 14. No pain reported.  Dentist advised patient of possible sinus involvement but patient remains asymptomatic. - Consider CT if symptoms of sinus involvement develop.  Patient declines for the time being. - Discuss potential referral to ENT for further evaluation if symptoms develop.  General Health Maintenance Flu shot discussion postponed due to current antibiotic treatment for COPD exacerbation. Aware of need for flu vaccination post-treatment. - Administer flu shot after completion of antibiotic treatment.    Return in about 3 months (around 04/11/2024) for Chronic f/u, and for mAWV with AWV nurse.  I discussed the assessment and treatment plan with the patient  The patient was provided an opportunity to ask questions and all were answered. The patient agreed with the plan and demonstrated an understanding of the instructions.   The patient was advised to call back or seek an in-person evaluation if the symptoms worsen or if the condition fails to improve as anticipated.    LAURAINE LOISE BUOY, DO  [facility] [phone] (phone) [phone] (fax)  [facility]

## 2024-01-11 NOTE — Assessment & Plan Note (Addendum)
 Noted to be mild by cardiology 12/20/23. Patient has revatio 20 mg daily PRN but has not taken in many years. Advised patient to discuss with pulmonology.

## 2024-01-24 ENCOUNTER — Encounter: Payer: Self-pay | Admitting: Family Medicine

## 2024-02-07 DIAGNOSIS — H52223 Regular astigmatism, bilateral: Secondary | ICD-10-CM | POA: Diagnosis not present

## 2024-02-07 DIAGNOSIS — H5213 Myopia, bilateral: Secondary | ICD-10-CM | POA: Diagnosis not present

## 2024-02-07 DIAGNOSIS — H524 Presbyopia: Secondary | ICD-10-CM | POA: Diagnosis not present

## 2024-02-07 DIAGNOSIS — H2513 Age-related nuclear cataract, bilateral: Secondary | ICD-10-CM | POA: Diagnosis not present

## 2024-02-21 ENCOUNTER — Ambulatory Visit: Admitting: Urology

## 2024-02-21 VITALS — BP 174/108 | HR 87 | Ht 72.0 in | Wt 182.0 lb

## 2024-02-21 DIAGNOSIS — R32 Unspecified urinary incontinence: Secondary | ICD-10-CM | POA: Diagnosis not present

## 2024-02-21 LAB — MICROSCOPIC EXAMINATION: Bacteria, UA: NONE SEEN

## 2024-02-21 LAB — URINALYSIS, COMPLETE
Bilirubin, UA: NEGATIVE
Glucose, UA: NEGATIVE
Ketones, UA: NEGATIVE
Leukocytes,UA: NEGATIVE
Nitrite, UA: NEGATIVE
Protein,UA: NEGATIVE
RBC, UA: NEGATIVE
Specific Gravity, UA: 1.01 (ref 1.005–1.030)
Urobilinogen, Ur: 1 mg/dL (ref 0.2–1.0)
pH, UA: 6.5 (ref 5.0–7.5)

## 2024-02-21 NOTE — Patient Instructions (Signed)

## 2024-02-21 NOTE — Progress Notes (Addendum)
 02/21/2024 11:10 AM   Gaither MATSU Bolt 31-Mar-1946 [id number]  Referring provider: Donzella Lauraine SAILOR, DO [address] [place],  [place] [address]  Chief Complaint  Patient presents with   Establish Care    HPI: I was consulted to assess the patient's urinary incontinence.  He had a robotic prostatectomy at [facility] 14 years ago.  He leaked 2 pads a day initially for a number of years and gradually is worsened.  He now wears 4 pads a day moderately wet since hip surgery 4 years ago.  Currently he leaks sometimes with coughing sneezing bending and lifting.  He leaks when he is active.  Sometimes he has urge incontinence.  No bedwetting.  He wears 4 pads a day moderately wet  Flow was poor.  He does feel empty.  He does not strain.  He voids every 1-2 hours depending on fluid intake.  He gets up once at night  He is left-handed.  He had a left hernia repair in the last few months with mesh.  He describes having a catheter for approximately 2 weeks after surgery and then going back with retention and possibly had a bladder neck contracture.  I did not see this in the [facility] medical record  No treatment.  No neurologic issues.  Bowel function normal   PMH: Past Medical History:  Diagnosis Date   Acute blood loss anemia (ABLA)    a.) following hip surgery 01/2021   Acute respiratory failure with hypoxia (HCC) 02/10/2021   a.) following hip surgery in 01/2021   Aortic atherosclerosis    Aortic atherosclerosis    Bilateral carotid artery disease    Bilateral renal cysts    CAD (coronary artery disease)    Cerebral microvascular disease    Cervical spinal stenosis    Cervical spondylosis    COPD (chronic obstructive pulmonary disease) (HCC)    Diastolic dysfunction    Dyspnea    Emphysema of lung (HCC)    Erectile dysfunction    a.) on PDE5i (sildenafil)   History of acute respiratory failure    HLD (hyperlipidemia)    Hypertension    Lacunar cerebrovascular accident  (CVA) (HCC)    a.) noted on brain MRI 04/19/2021; LEFT thalamocapsular junction   LAFB (left anterior fascicular block)    Left inguinal hernia    Long-term use of aspirin  therapy    New onset left bundle branch block (LBBB) 11/30/2023   a.) noted on preoperative ECG on 11/30/2023   Prostate cancer [facility])    a.) s/p radical prostatectomy 2011   Pulmonary hypertension (HCC)    Pulmonary nodules    Ramsay Hunt syndrome (geniculate herpes zoster)    Supraumbilical hernia    TIA (transient ischemic attack) 03/2021   Tobacco abuse     Surgical History: Past Surgical History:  Procedure Laterality Date   COLONOSCOPY  10/18/2006   COLONOSCOPY WITH PROPOFOL  N/A 02/03/2017   Procedure: COLONOSCOPY WITH PROPOFOL ;  Surgeon: Dessa Reyes ORN, MD;  Location: [facility] ENDOSCOPY;  Service: Endoscopy;  Laterality: N/A;   FRACTURE SURGERY  2022 & 2016   Tibia, Hip, Wrist   INGUINAL HERNIA REPAIR Left 12/29/2023   Procedure: REPAIR, HERNIA, INGUINAL, ADULT;  Surgeon: Rodolph Romano, MD;  Location: [facility] ORS;  Service: General;  Laterality: Left;   INTRAMEDULLARY (IM) NAIL INTERTROCHANTERIC Right 02/09/2021   Procedure: INTRAMEDULLARY (IM) NAIL INTERTROCHANTRIC;  Surgeon: Edie Norleen PARAS, MD;  Location: [facility] ORS;  Service: Orthopedics;  Laterality: Right;  OPEN REDUCTION INTERNAL FIXATION (ORIF) DISTAL RADIAL FRACTURE Right 02/09/2021   Procedure: OPEN REDUCTION INTERNAL FIXATION (ORIF) DISTAL RADIAL FRACTURE;  Surgeon: Edie Norleen PARAS, MD;  Location: [facility] ORS;  Service: Orthopedics;  Laterality: Right;   ORIF TIBIA PLATEAU Left 11/29/2015   Procedure: OPEN REDUCTION INTERNAL FIXATION (ORIF) TIBIAL PLATEAU;  Surgeon: Norleen PARAS Edie, MD;  Location: [facility] ORS;  Service: Orthopedics;  Laterality: Left;   PROSTATECTOMY N/A 2011    Home Medications:  Allergies as of 02/21/2024       Reactions   Amoxicillin Diarrhea        Medication List        Accurate as of February 21, 2024 11:10 AM. If you  have any questions, ask your nurse or doctor.          albuterol  108 (90 Base) MCG/ACT inhaler Commonly known as: VENTOLIN  HFA Inhale 2 puffs into the lungs every 6 (six) hours as needed for wheezing or shortness of breath.   amLODipine  2.5 MG tablet Commonly known as: NORVASC  Take 1 tablet (2.5 mg total) by mouth at bedtime.   aspirin  EC 81 MG tablet Take 1 tablet (81 mg total) by mouth daily. Swallow whole.   atorvastatin  40 MG tablet Commonly known as: LIPITOR Take 1 tablet (40 mg total) by mouth at bedtime.   clobetasol  ointment 0.05 % Commonly known as: TEMOVATE  Apply 1 Application topically 2 (two) times daily as needed (Skin problems).   clobetasol  0.05 % external solution Commonly known as: TEMOVATE  Apply 1 Application topically 2 (two) times daily.   fluticasone  50 MCG/ACT nasal spray Commonly known as: FLONASE  Place 2 sprays into both nostrils daily. What changed:  when to take this reasons to take this   ipratropium 0.03 % nasal spray Commonly known as: ATROVENT Place 2 sprays into the nose.   sildenafil 20 MG tablet Commonly known as: REVATIO Take 20 mg by mouth daily as needed (ED).   Trelegy Ellipta  100-62.5-25 MCG/ACT Aepb Generic drug: Fluticasone -Umeclidin-Vilant Inhale 1 puff into the lungs daily.        Allergies:  Allergies  Allergen Reactions   Amoxicillin Diarrhea    Family History: Family History  Problem Relation Age of Onset   Dementia Mother    Heart disease Father    Alcohol abuse Sister     Social History:  reports that he quit smoking about 3 years ago. His smoking use included cigarettes. He started smoking about 53 years ago. He has a 50 pack-year smoking history. He has never used smokeless tobacco. He reports current alcohol use. He reports that he does not use drugs.  ROS:                                        Physical Exam: BP (!) 174/108   Pulse 87   Ht 6' (1.829 m)   Wt 82.6 kg    BMI 24.68 kg/m   Constitutional:  Alert and oriented, No acute distress. HEENT: [place] AT, moist mucus membranes.  Trachea midline, no masses. Cardiovascular: No clubbing, cyanosis, or edema. Respiratory: Normal respiratory effort, no increased work of breathing. GI: Abdomen is soft, nontender, nondistended, no abdominal masses GU: Male genitalia normal.  Negative cough test in the standing position.  Healing left lower quadrant incision Skin: No rashes, bruises or suspicious lesions. Lymph: No cervical or inguinal adenopathy. Neurologic: Grossly intact, no focal deficits, moving  all 4 extremities. Psychiatric: Normal mood and affect.  Laboratory Data: Lab Results  Component Value Date   WBC 8.8 11/22/2023   HGB 14.2 11/22/2023   HCT 41.5 11/22/2023   MCV 97.2 11/22/2023   PLT 195 11/22/2023    Lab Results  Component Value Date   CREATININE 0.78 11/22/2023    No results found for: PSA  No results found for: TESTOSTERONE  Lab Results  Component Value Date   HGBA1C 5.4 04/19/2021    Urinalysis No results found for: COLORURINE, APPEARANCEUR, LABSPEC, PHURINE, GLUCOSEU, HGBUR, BILIRUBINUR, KETONESUR, PROTEINUR, UROBILINOGEN, NITRITE, LEUKOCYTESUR  Pertinent Imaging: Urine reviewed and sent for culture.  Chart reviewed  Assessment & Plan: Patient has mixed incontinence.  He does have a slow flow.  Role of urodynamics and cystoscopy discussed.  Potential long-term site of service discussed and referral discussed.  He says his main leaking is walking his dog and going up a hill.  Even though he uses home oxygen he is very active and would like to proceed.  It will be interesting to see if he has a bladder neck contracture  If patient does not demonstrate stress incontinence they can be checked again using the rectal wide and removing the urethral catheter  1. Urinary incontinence, unspecified type (Primary)  - Urinalysis, Complete   No follow-ups  on file.  Glendia DELENA Elizabeth, MD  [facility] [address] [place], [place] [address] [phone]

## 2024-02-28 DIAGNOSIS — R0609 Other forms of dyspnea: Secondary | ICD-10-CM | POA: Insufficient documentation

## 2024-03-15 ENCOUNTER — Encounter: Attending: Pulmonary Disease | Admitting: *Deleted

## 2024-03-15 DIAGNOSIS — J432 Centrilobular emphysema: Secondary | ICD-10-CM | POA: Insufficient documentation

## 2024-03-15 NOTE — Progress Notes (Signed)
 Initial phone call completed. Diagnosis can be found in [facility] 9/15. EP Orientation scheduled for Wednesday 11/26 at 10:30am.

## 2024-03-22 ENCOUNTER — Encounter

## 2024-03-22 VITALS — Ht 70.6 in | Wt 178.5 lb

## 2024-03-22 DIAGNOSIS — J432 Centrilobular emphysema: Secondary | ICD-10-CM | POA: Diagnosis present

## 2024-03-22 NOTE — Patient Instructions (Signed)
 Patient Instructions  Patient Details  Name: Jordan Barber MRN: [id number] Date of Birth: 08/22/1945 Referring Provider:  Parris Manna, MD  Below are your personal goals for exercise, nutrition, and risk factors. Our goal is to help you stay on track towards obtaining and maintaining these goals. We will be discussing your progress on these goals with you throughout the program.  Initial Exercise Prescription:  Initial Exercise Prescription - 03/22/24 1300       Date of Initial Exercise RX and Referring Provider   Date 03/22/24    Referring Provider Parris Manna, MD      Oxygen   Oxygen Continuous    Liters 4    Maintain Oxygen Saturation 88% or higher      Treadmill   MPH 2.2    Grade 0    Minutes 15    METs 2.68      NuStep   Level 2    SPM 80    Minutes 15    METs 2.61      REL-XR   Level 1    Speed 50    Minutes 15    METs 2.61      T5 Nustep   Level 2    SPM 80    Minutes 15    METs 2.61      Rower   Level 2    Watts 20    Minutes 15    METs 2.61      Prescription Details   Frequency (times per week) 3    Duration Progress to 30 minutes of continuous aerobic without signs/symptoms of physical distress      Intensity   THRR 40-80% of Max Heartrate 100-129    Ratings of Perceived Exertion 11-13    Perceived Dyspnea 0-4      Progression   Progression Continue to progress workloads to maintain intensity without signs/symptoms of physical distress.      Resistance Training   Training Prescription Yes    Weight 7 lb    Reps 10-15          Exercise Goals: Frequency: Be able to perform aerobic exercise two to three times per week in program working toward 2-5 days per week of home exercise.  Intensity: Work with a perceived exertion of 11 (fairly light) - 15 (hard) while following your exercise prescription.  We will make changes to your prescription with you as you progress through the program.   Duration: Be able to do 30 to 45 minutes  of continuous aerobic exercise in addition to a 5 minute warm-up and a 5 minute cool-down routine.   Nutrition Goals: Your personal nutrition goals will be established when you do your nutrition analysis with the dietician.  The following are general nutrition guidelines to follow: Cholesterol < 200mg /day Sodium < 1500mg /day Fiber: Men over 50 yrs - 30 grams per day  Personal Goals:  Personal Goals and Risk Factors at Admission - 03/22/24 1313       Core Components/Risk Factors/Patient Goals on Admission    Weight Management Yes;Weight Maintenance;Weight Loss    Intervention Weight Management: Develop a combined nutrition and exercise program designed to reach desired caloric intake, while maintaining appropriate intake of nutrient and fiber, sodium and fats, and appropriate energy expenditure required for the weight goal.;Weight Management: Provide education and appropriate resources to help participant work on and attain dietary goals.;Weight Management/Obesity: Establish reasonable short term and long term weight goals.    Admit Weight 178  lb 8 oz (81 kg)    Goal Weight: Short Term 173 lb (78.5 kg)    Goal Weight: Long Term 173 lb (78.5 kg)    Expected Outcomes Short Term: Continue to assess and modify interventions until short term weight is achieved;Long Term: Adherence to nutrition and physical activity/exercise program aimed toward attainment of established weight goal;Understanding recommendations for meals to include 15-35% energy as protein, 25-35% energy from fat, 35-60% energy from carbohydrates, less than 200mg  of dietary cholesterol, 20-35 gm of total fiber daily;Understanding of distribution of calorie intake throughout the day with the consumption of 4-5 meals/snacks;Weight Maintenance: Understanding of the daily nutrition guidelines, which includes 25-35% calories from fat, 7% or less cal from saturated fats, less than 200mg  cholesterol, less than 1.5gm of sodium, & 5 or more  servings of fruits and vegetables daily    Hypertension Yes    Intervention Provide education on lifestyle modifcations including regular physical activity/exercise, weight management, moderate sodium restriction and increased consumption of fresh fruit, vegetables, and low fat dairy, alcohol moderation, and smoking cessation.;Monitor prescription use compliance.    Expected Outcomes Short Term: Continued assessment and intervention until BP is < 140/25mm HG in hypertensive participants. < 130/20mm HG in hypertensive participants with diabetes, heart failure or chronic kidney disease.;Long Term: Maintenance of blood pressure at goal levels.    Lipids Yes    Intervention Provide education and support for participant on nutrition & aerobic/resistive exercise along with prescribed medications to achieve LDL 70mg , HDL >40mg .    Expected Outcomes Short Term: Participant states understanding of desired cholesterol values and is compliant with medications prescribed. Participant is following exercise prescription and nutrition guidelines.;Long Term: Cholesterol controlled with medications as prescribed, with individualized exercise RX and with personalized nutrition plan. Value goals: LDL < 70mg , HDL > 40 mg.          Tobacco Use Initial Evaluation: Social History   Tobacco Use  Smoking Status Former   Current packs/day: 0.00   Average packs/day: 1 pack/day for 50.0 years (50.0 ttl pk-yrs)   Types: Cigarettes   Start date: 02/09/1971   Quit date: 02/08/2021   Years since quitting: 3.1  Smokeless Tobacco Never    Exercise Goals and Review:  Exercise Goals     Row Name 03/22/24 1309             Exercise Goals   Increase Physical Activity Yes       Intervention Provide advice, education, support and counseling about physical activity/exercise needs.;Develop an individualized exercise prescription for aerobic and resistive training based on initial evaluation findings, risk stratification,  comorbidities and participant's personal goals.       Expected Outcomes Short Term: Attend rehab on a regular basis to increase amount of physical activity.;Long Term: Add in home exercise to make exercise part of routine and to increase amount of physical activity.;Long Term: Exercising regularly at least 3-5 days a week.       Increase Strength and Stamina Yes       Intervention Provide advice, education, support and counseling about physical activity/exercise needs.;Develop an individualized exercise prescription for aerobic and resistive training based on initial evaluation findings, risk stratification, comorbidities and participant's personal goals.       Expected Outcomes Short Term: Increase workloads from initial exercise prescription for resistance, speed, and METs.;Short Term: Perform resistance training exercises routinely during rehab and add in resistance training at home;Long Term: Improve cardiorespiratory fitness, muscular endurance and strength as measured by increased METs and  functional capacity ( )       Able to understand and use rate of perceived exertion (RPE) scale Yes       Intervention Provide education and explanation on how to use RPE scale       Expected Outcomes Short Term: Able to use RPE daily in rehab to express subjective intensity level;Long Term:  Able to use RPE to guide intensity level when exercising independently       Able to understand and use Dyspnea scale Yes       Intervention Provide education and explanation on how to use Dyspnea scale       Expected Outcomes Long Term: Able to use Dyspnea scale to guide intensity level when exercising independently;Short Term: Able to use Dyspnea scale daily in rehab to express subjective sense of shortness of breath during exertion       Knowledge and understanding of Target Heart Rate [place] (THRR) Yes       Intervention Provide education and explanation of THRR including how the numbers were predicted and where they  are located for reference       Expected Outcomes Short Term: Able to state/look up THRR;Short Term: Able to use daily as guideline for intensity in rehab;Long Term: Able to use THRR to govern intensity when exercising independently       Able to check pulse independently Yes       Intervention Review the importance of being able to check your own pulse for safety during independent exercise;Provide education and demonstration on how to check pulse in carotid and radial arteries.       Expected Outcomes Short Term: Able to explain why pulse checking is important during independent exercise;Long Term: Able to check pulse independently and accurately       Understanding of Exercise Prescription Yes       Intervention Provide education, explanation, and written materials on patient's individual exercise prescription       Expected Outcomes Short Term: Able to explain program exercise prescription;Long Term: Able to explain home exercise prescription to exercise independently

## 2024-03-22 NOTE — Progress Notes (Signed)
 Pulmonary Individual Treatment Plan  Patient Details  Name: Jordan Barber MRN: [id number] Date of Birth: [birth year] Referring Provider:   Flowsheet Row Pulmonary Rehab from 03/22/2024 in [facility] Cardiac and Pulmonary Rehab  Referring Provider Parris Manna, MD    Initial Encounter Date:  Flowsheet Row Pulmonary Rehab from 03/22/2024 in [facility] Cardiac and Pulmonary Rehab  Date 03/22/24    Visit Diagnosis: Centrilobular emphysema (HCC)  Patient's Home Medications on Admission:  Current Outpatient Medications:    albuterol  (VENTOLIN  HFA) 108 (90 Base) MCG/ACT inhaler, Inhale 2 puffs into the lungs every 6 (six) hours as needed for wheezing or shortness of breath., Disp: 8 g, Rfl: 5   amLODipine  (NORVASC ) 2.5 MG tablet, Take 1 tablet (2.5 mg total) by mouth at bedtime., Disp: 90 tablet, Rfl: 0   aspirin  EC 81 MG EC tablet, Take 1 tablet (81 mg total) by mouth daily. Swallow whole., Disp: 30 tablet, Rfl: 11   atorvastatin  (LIPITOR) 40 MG tablet, Take 1 tablet (40 mg total) by mouth at bedtime., Disp: 90 tablet, Rfl: 3   clobetasol  (TEMOVATE ) 0.05 % external solution, Apply 1 Application topically 2 (two) times daily., Disp: 50 mL, Rfl: 0   clobetasol  ointment (TEMOVATE ) 0.05 %, Apply 1 Application topically 2 (two) times daily as needed (Skin problems)., Disp: , Rfl:    fluticasone  (FLONASE ) 50 MCG/ACT nasal spray, Place 2 sprays into both nostrils daily. (Patient taking differently: Place 2 sprays into both nostrils daily as needed for rhinitis or allergies.), Disp: , Rfl: 2   ipratropium (ATROVENT) 0.03 % nasal spray, Place 2 sprays into the nose., Disp: , Rfl:    sildenafil (REVATIO) 20 MG tablet, Take 20 mg by mouth daily as needed (ED)., Disp: , Rfl:    TRELEGY ELLIPTA  100-62.5-25 MCG/ACT AEPB, Inhale 1 puff into the lungs daily. (Patient not taking: Reported on 03/15/2024), Disp: 60 each, Rfl: 6   TRELEGY ELLIPTA  200-62.5-25 MCG/ACT AEPB, Inhale 1 puff into the lungs daily., Disp: , Rfl:    Past Medical History: Past Medical History:  Diagnosis Date   Acute blood loss anemia (ABLA)    a.) following hip surgery 01/2021   Acute respiratory failure with hypoxia (HCC) 02/10/2021   a.) following hip surgery in 01/2021   Aortic atherosclerosis    Aortic atherosclerosis    Bilateral carotid artery disease    Bilateral renal cysts    CAD (coronary artery disease)    Cerebral microvascular disease    Cervical spinal stenosis    Cervical spondylosis    COPD (chronic obstructive pulmonary disease) (HCC)    Diastolic dysfunction    Dyspnea    Emphysema of lung (HCC)    Erectile dysfunction    a.) on PDE5i (sildenafil)   History of acute respiratory failure    HLD (hyperlipidemia)    Hypertension    Lacunar cerebrovascular accident (CVA) (HCC)    a.) noted on brain MRI 04/19/2021; LEFT thalamocapsular junction   LAFB (left anterior fascicular block)    Left inguinal hernia    Long-term use of aspirin  therapy    New onset left bundle branch block (LBBB) 11/30/2023   a.) noted on preoperative ECG on 11/30/2023   Prostate cancer [facility])    a.) s/p radical prostatectomy 2011   Pulmonary hypertension (HCC)    Pulmonary nodules    Ramsay Hunt syndrome (geniculate herpes zoster)    Supraumbilical hernia    TIA (transient ischemic attack) 03/2021   Tobacco abuse     Tobacco Use:  Social History   Tobacco Use  Smoking Status Former   Current packs/day: 0.00   Average packs/day: 1 pack/day for 50.0 years (50.0 ttl pk-yrs)   Types: Cigarettes   Start date: 02/09/1971   Quit date: 02/08/2021   Years since quitting: 3.1  Smokeless Tobacco Never    Labs: Review Flowsheet       Latest Ref Rng & Units 04/19/2021  Labs for ITP Cardiac and Pulmonary Rehab  Cholestrol 0 - 200 mg/dL 809   LDL (calc) 0 - 99 mg/dL 882   HDL-C >59 mg/dL 60   Trlycerides <849 mg/dL 65   Hemoglobin J8r 4.8 - 5.6 % 5.4      Pulmonary Assessment Scores:  Pulmonary Assessment Scores      Row Name 03/22/24 1310         ADL UCSD   ADL Phase Entry     SOB Score total 32     Rest 0     Walk 1     Stairs 2     Bath 1     Dress 1     Shop 2       CAT Score   CAT Score 14       mMRC Score   mMRC Score 0        UCSD: Self-administered rating of dyspnea associated with activities of daily living (ADLs) 6-point scale (0 = not at all to 5 = maximal or unable to do because of breathlessness)  Scoring Scores Fedie from 0 to 120.  Minimally important difference is 5 units  CAT: CAT can identify the health impairment of COPD patients and is better correlated with disease progression.  CAT has a scoring Dunlap of zero to 40. The CAT score is classified into four groups of low (less than 10), medium (10 - 20), high (21-30) and very high (31-40) based on the impact level of disease on health status. A CAT score over 10 suggests significant symptoms.  A worsening CAT score could be explained by an exacerbation, poor medication adherence, poor inhaler technique, or progression of COPD or comorbid conditions.  CAT MCID is 2 points  mMRC: mMRC (Modified Medical Research Council) Dyspnea Scale is used to assess the degree of baseline functional disability in patients of respiratory disease due to dyspnea. No minimal important difference is established. A decrease in score of 1 point or greater is considered a positive change.   Pulmonary Function Assessment:   Exercise Target Goals: Exercise Program Goal: Individual exercise prescription set using results from initial 6 min walk test and THRR while considering  patient's activity barriers and safety.   Exercise Prescription Goal: Initial exercise prescription builds to 30-45 minutes a day of aerobic activity, 2-3 days per week.  Home exercise guidelines will be given to patient during program as part of exercise prescription that the participant will acknowledge.  Education: Aerobic Exercise: - Group verbal and visual  presentation on the components of exercise prescription. Introduces F.I.T.T principle from ACSM for exercise prescriptions.  Reviews F.I.T.T. principles of aerobic exercise including progression. Written material provided at class time.   Education: Resistance Exercise: - Group verbal and visual presentation on the components of exercise prescription. Introduces F.I.T.T principle from ACSM for exercise prescriptions  Reviews F.I.T.T. principles of resistance exercise including progression. Written material provided at class time.    Education: Exercise & Equipment Safety: - Individual verbal instruction and demonstration of equipment use and safety with use of the equipment. Flowsheet  Row Pulmonary Rehab from 03/22/2024 in [facility] Cardiac and Pulmonary Rehab  Date 03/22/24  Educator MB  Instruction Review Code 1- Verbalizes Understanding    Education: Exercise Physiology & General Exercise Guidelines: - Group verbal and written instruction with models to review the exercise physiology of the cardiovascular system and associated critical values. Provides general exercise guidelines with specific guidelines to those with heart or lung disease.    Education: Flexibility, Balance, Mind/Body Relaxation: - Group verbal and visual presentation with interactive activity on the components of exercise prescription. Introduces F.I.T.T principle from ACSM for exercise prescriptions. Reviews F.I.T.T. principles of flexibility and balance exercise training including progression. Also discusses the mind body connection.  Reviews various relaxation techniques to help reduce and manage stress (i.e. Deep breathing, progressive muscle relaxation, and visualization). Balance handout provided to take home. Written material provided at class time.   Activity Barriers & Risk Stratification:  Activity Barriers & Cardiac Risk Stratification - 03/22/24 1302       Activity Barriers & Cardiac Risk Stratification    Activity Barriers None          6 Minute Walk:  6 Minute Walk     Row Name 03/22/24 1257         6 Minute Walk   Phase Initial     Distance 1165 feet     Walk Time 6 minutes     # of Rest Breaks 0     MPH 2.21     METS 2.61     RPE 9     Perceived Dyspnea  1     VO2 Peak 9.14     Symptoms No     Resting HR 75 bpm     Resting BP 148/82     Resting Oxygen Saturation  91 %     Exercise Oxygen Saturation  during 6 min walk 87 %     Max Ex. HR 96 bpm     Max Ex. BP 146/84     2 Minute Post BP 136/80       Interval HR   1 Minute HR 90     2 Minute HR 93     3 Minute HR 96     4 Minute HR 96     5 Minute HR 91     6 Minute HR 92     2 Minute Post HR 73     Interval Heart Rate? Yes       Interval Oxygen   Interval Oxygen? Yes     Baseline Oxygen Saturation % 91 %     1 Minute Oxygen Saturation % 90 %     1 Minute Liters of Oxygen 4 L     2 Minute Oxygen Saturation % 88 %     2 Minute Liters of Oxygen 4 L     3 Minute Oxygen Saturation % 87 %     3 Minute Liters of Oxygen 4 L     4 Minute Oxygen Saturation % 87 %     4 Minute Liters of Oxygen 4 L     5 Minute Oxygen Saturation % 87 %     5 Minute Liters of Oxygen 4 L     6 Minute Oxygen Saturation % 87 %     6 Minute Liters of Oxygen 4 L     2 Minute Post Oxygen Saturation % 96 %     2 Minute Post Liters of Oxygen 73 L  Oxygen Initial Assessment:  Oxygen Initial Assessment - 03/15/24 1338       Home Oxygen   Home Oxygen Device E-Tanks;Portable Concentrator    Home Exercise Oxygen Prescription Continuous    Liters per minute 4    Home Resting Oxygen Prescription Continuous    Liters per minute 3    Compliance with Home Oxygen Use Yes      Intervention   Short Term Goals To learn and understand importance of monitoring SPO2 with pulse oximeter and demonstrate accurate use of the pulse oximeter.;To learn and understand importance of maintaining oxygen saturations>88%;To learn and demonstrate proper  pursed lip breathing techniques or other breathing techniques. ;To learn and demonstrate proper use of respiratory medications;To learn and exhibit compliance with exercise, home and travel O2 prescription    Long  Term Goals Verbalizes importance of monitoring SPO2 with pulse oximeter and return demonstration;Maintenance of O2 saturations>88%;Exhibits proper breathing techniques, such as pursed lip breathing or other method taught during program session;Compliance with respiratory medication;Demonstrates proper use of MDI's;Exhibits compliance with exercise, home  and travel O2 prescription          Oxygen Re-Evaluation:   Oxygen Discharge (Final Oxygen Re-Evaluation):   Initial Exercise Prescription:  Initial Exercise Prescription - 03/22/24 1300       Date of Initial Exercise RX and Referring Provider   Date 03/22/24    Referring Provider Parris Manna, MD      Oxygen   Oxygen Continuous    Liters 4    Maintain Oxygen Saturation 88% or higher      Treadmill   MPH 2.2    Grade 0    Minutes 15    METs 2.68      NuStep   Level 2    SPM 80    Minutes 15    METs 2.61      REL-XR   Level 1    Speed 50    Minutes 15    METs 2.61      T5 Nustep   Level 2    SPM 80    Minutes 15    METs 2.61      Rower   Level 2    Watts 20    Minutes 15    METs 2.61      Prescription Details   Frequency (times per week) 3    Duration Progress to 30 minutes of continuous aerobic without signs/symptoms of physical distress      Intensity   THRR 40-80% of Max Heartrate 100-129    Ratings of Perceived Exertion 11-13    Perceived Dyspnea 0-4      Progression   Progression Continue to progress workloads to maintain intensity without signs/symptoms of physical distress.      Resistance Training   Training Prescription Yes    Weight 7 lb    Reps 10-15          Perform Capillary Blood Glucose checks as needed.  Exercise Prescription Changes:   Exercise Prescription  Changes     Row Name 03/22/24 1300             Response to Exercise   Blood Pressure (Admit) 148/82       Blood Pressure (Exercise) 146/84       Blood Pressure (Exit) 136/80       Heart Rate (Admit) 75 bpm       Heart Rate (Exercise) 96 bpm       Heart Rate (  Exit) 73 bpm       Oxygen Saturation (Admit) 91 %  Room air       Oxygen Saturation (Exercise) 87 %  4L O2       Oxygen Saturation (Exit) 96 %  4L O2       Rating of Perceived Exertion (Exercise) 9       Perceived Dyspnea (Exercise) 1       Symptoms none       Comments results         Progression   Average METs 2.61          Exercise Comments:   Exercise Goals and Review:   Exercise Goals     Row Name 03/22/24 1309             Exercise Goals   Increase Physical Activity Yes       Intervention Provide advice, education, support and counseling about physical activity/exercise needs.;Develop an individualized exercise prescription for aerobic and resistive training based on initial evaluation findings, risk stratification, comorbidities and participant's personal goals.       Expected Outcomes Short Term: Attend rehab on a regular basis to increase amount of physical activity.;Long Term: Add in home exercise to make exercise part of routine and to increase amount of physical activity.;Long Term: Exercising regularly at least 3-5 days a week.       Increase Strength and Stamina Yes       Intervention Provide advice, education, support and counseling about physical activity/exercise needs.;Develop an individualized exercise prescription for aerobic and resistive training based on initial evaluation findings, risk stratification, comorbidities and participant's personal goals.       Expected Outcomes Short Term: Increase workloads from initial exercise prescription for resistance, speed, and METs.;Short Term: Perform resistance training exercises routinely during rehab and add in resistance training at home;Long  Term: Improve cardiorespiratory fitness, muscular endurance and strength as measured by increased METs and functional capacity ( )       Able to understand and use rate of perceived exertion (RPE) scale Yes       Intervention Provide education and explanation on how to use RPE scale       Expected Outcomes Short Term: Able to use RPE daily in rehab to express subjective intensity level;Long Term:  Able to use RPE to guide intensity level when exercising independently       Able to understand and use Dyspnea scale Yes       Intervention Provide education and explanation on how to use Dyspnea scale       Expected Outcomes Long Term: Able to use Dyspnea scale to guide intensity level when exercising independently;Short Term: Able to use Dyspnea scale daily in rehab to express subjective sense of shortness of breath during exertion       Knowledge and understanding of Target Heart Rate [place] (THRR) Yes       Intervention Provide education and explanation of THRR including how the numbers were predicted and where they are located for reference       Expected Outcomes Short Term: Able to state/look up THRR;Short Term: Able to use daily as guideline for intensity in rehab;Long Term: Able to use THRR to govern intensity when exercising independently       Able to check pulse independently Yes       Intervention Review the importance of being able to check your own pulse for safety during independent exercise;Provide education and demonstration on how to check  pulse in carotid and radial arteries.       Expected Outcomes Short Term: Able to explain why pulse checking is important during independent exercise;Long Term: Able to check pulse independently and accurately       Understanding of Exercise Prescription Yes       Intervention Provide education, explanation, and written materials on patient's individual exercise prescription       Expected Outcomes Short Term: Able to explain program exercise  prescription;Long Term: Able to explain home exercise prescription to exercise independently          Exercise Goals Re-Evaluation :   Discharge Exercise Prescription (Final Exercise Prescription Changes):  Exercise Prescription Changes - 03/22/24 1300       Response to Exercise   Blood Pressure (Admit) 148/82    Blood Pressure (Exercise) 146/84    Blood Pressure (Exit) 136/80    Heart Rate (Admit) 75 bpm    Heart Rate (Exercise) 96 bpm    Heart Rate (Exit) 73 bpm    Oxygen Saturation (Admit) 91 %   Room air   Oxygen Saturation (Exercise) 87 %   4L O2   Oxygen Saturation (Exit) 96 %   4L O2   Rating of Perceived Exertion (Exercise) 9    Perceived Dyspnea (Exercise) 1    Symptoms none    Comments results      Progression   Average METs 2.61          Nutrition:  Target Goals: Understanding of nutrition guidelines, daily intake of sodium 1500mg , cholesterol 200mg , calories 30% from fat and 7% or less from saturated fats, daily to have 5 or more servings of fruits and vegetables.  Education: Nutrition 1 -Group instruction provided by verbal, written material, interactive activities, discussions, models, and posters to present general guidelines for heart healthy nutrition including macronutrients, label reading, and promoting whole foods over processed counterparts. Education serves as pensions consultant of discussion of heart healthy eating for all. Written material provided at class time.     Education: Nutrition 2 -Group instruction provided by verbal, written material, interactive activities, discussions, models, and posters to present general guidelines for heart healthy nutrition including sodium, cholesterol, and saturated fat. Providing guidance of habit forming to improve blood pressure, cholesterol, and body weight. Written material provided at class time.     Biometrics:  Pre Biometrics - 03/22/24 1309       Pre Biometrics   Height 5' 10.6 (1.793 m)     Weight 178 lb 8 oz (81 kg)    Waist Circumference 39.3 inches    Hip Circumference 43.8 inches    Waist to Hip Ratio 0.9 %    BMI (Calculated) 25.19    Single Leg Stand 2.7 seconds           Nutrition Therapy Plan and Nutrition Goals:  Nutrition Therapy & Goals - 03/22/24 1312       Personal Nutrition Goals   Nutrition Goal Will schedule RD appointment in a few weeks after talking with his wife.      Intervention Plan   Intervention Prescribe, educate and counsel regarding individualized specific dietary modifications aiming towards targeted core components such as weight, hypertension, lipid management, diabetes, heart failure and other comorbidities.    Expected Outcomes Short Term Goal: Understand basic principles of dietary content, such as calories, fat, sodium, cholesterol and nutrients.          Nutrition Assessments:  MEDIFICTS Score Key: >=70 Need to make dietary changes  40-70 Heart Healthy Diet <= 40 Therapeutic Level Cholesterol Diet  Flowsheet Row Pulmonary Rehab from 03/22/2024 in [facility] Cardiac and Pulmonary Rehab  Picture Your Plate Total Score on Admission 63   Picture Your Plate Scores: <59 Unhealthy dietary pattern with much room for improvement. 41-50 Dietary pattern unlikely to meet recommendations for good health and room for improvement. 51-60 More healthful dietary pattern, with some room for improvement.  >60 Healthy dietary pattern, although there may be some specific behaviors that could be improved.   Nutrition Goals Re-Evaluation:   Nutrition Goals Discharge (Final Nutrition Goals Re-Evaluation):   Psychosocial: Target Goals: Acknowledge presence or absence of significant depression and/or stress, maximize coping skills, provide positive support system. Participant is able to verbalize types and ability to use techniques and skills needed for reducing stress and depression.   Education: Stress, Anxiety, and Depression - Group verbal and  visual presentation to define topics covered.  Reviews how body is impacted by stress, anxiety, and depression.  Also discusses healthy ways to reduce stress and to treat/manage anxiety and depression.  Written material provided at class time.   Education: Sleep Hygiene -Provides group verbal and written instruction about how sleep can affect your health.  Define sleep hygiene, discuss sleep cycles and impact of sleep habits. Review good sleep hygiene tips.    Initial Review & Psychosocial Screening:  Initial Psych Review & Screening - 03/15/24 1345       Initial Review   Current issues with None Identified      Family Dynamics   Good Support System? Yes      Barriers   Psychosocial barriers to participate in program There are no identifiable barriers or psychosocial needs.      Screening Interventions   Interventions Encouraged to exercise;Provide feedback about the scores to participant    Expected Outcomes Short Term goal: Utilizing psychosocial counselor, staff and physician to assist with identification of specific Stressors or current issues interfering with healing process. Setting desired goal for each stressor or current issue identified.;Long Term Goal: Stressors or current issues are controlled or eliminated.;Short Term goal: Identification and review with participant of any Quality of Life or Depression concerns found by scoring the questionnaire.;Long Term goal: The participant improves quality of Life and PHQ9 Scores as seen by post scores and/or verbalization of changes          Quality of Life Scores:  Scores of 19 and below usually indicate a poorer quality of life in these areas.  A difference of  2-3 points is a clinically meaningful difference.  A difference of 2-3 points in the total score of the Quality of Life Index has been associated with significant improvement in overall quality of life, self-image, physical symptoms, and general health in studies assessing  change in quality of life.  PHQ-9: Review Flowsheet       03/22/2024 11/30/2023  Depression screen PHQ 2/9  Decreased Interest 0 0  Down, Depressed, Hopeless 0 0  PHQ - 2 Score 0 0  Altered sleeping 0 0  Tired, decreased energy 0 0  Change in appetite 0 0  Feeling bad or failure about yourself  0 0  Trouble concentrating 0 0  Moving slowly or fidgety/restless 0 0  Suicidal thoughts 0 0  PHQ-9 Score 0 0   Difficult doing work/chores Not difficult at all Not difficult at all    Details       Data saved with a previous flowsheet row definition  Interpretation of Total Score  Total Score Depression Severity:  1-4 = Minimal depression, 5-9 = Mild depression, 10-14 = Moderate depression, 15-19 = Moderately severe depression, 20-27 = Severe depression   Psychosocial Evaluation and Intervention:  Psychosocial Evaluation - 03/15/24 1401       Psychosocial Evaluation & Interventions   Interventions Encouraged to exercise with the program and follow exercise prescription    Comments Mr. Debois is coming to pulmonary rehab with emphysema. He states he has no current stress concerns. His wife is his main support system. He has been wearing his oxygen more consistently and notes a difference in his symptoms. His doctor was pleased at his last visit with how his medication is working. He is looking forward to attending the program to work on his stamina.    Expected Outcomes Short: attend pulmonary rehab for education and exercise Long: develop and maintain positive self care habits    Continue Psychosocial Services  Follow up required by staff          Psychosocial Re-Evaluation:   Psychosocial Discharge (Final Psychosocial Re-Evaluation):   Education: Education Goals: Education classes will be provided on a weekly basis, covering required topics. Participant will state understanding/return demonstration of topics presented.  Learning Barriers/Preferences:  Learning  Barriers/Preferences - 03/15/24 1346       Learning Barriers/Preferences   Learning Barriers None    Learning Preferences Individual Instruction          General Pulmonary Education Topics:  Infection Prevention: - Provides verbal and written material to individual with discussion of infection control including proper hand washing and proper equipment cleaning during exercise session. Flowsheet Row Pulmonary Rehab from 03/22/2024 in [facility] Cardiac and Pulmonary Rehab  Date 03/22/24  Educator MB  Instruction Review Code 1- Verbalizes Understanding    Falls Prevention: - Provides verbal and written material to individual with discussion of falls prevention and safety. Flowsheet Row Pulmonary Rehab from 03/22/2024 in [facility] Cardiac and Pulmonary Rehab  Date 03/22/24  Educator MB  Instruction Review Code 1- Verbalizes Understanding    Chronic Lung Disease Review: - Group verbal instruction with posters, models, PowerPoint presentations and videos,  to review new updates, new respiratory medications, new advancements in procedures and treatments. Providing information on websites and 800 numbers for continued self-education. Includes information about supplement oxygen, available portable oxygen systems, continuous and intermittent flow rates, oxygen safety, concentrators, and Medicare reimbursement for oxygen. Explanation of Pulmonary Drugs, including class, frequency, complications, importance of spacers, rinsing mouth after steroid MDI's, and proper cleaning methods for nebulizers. Review of basic lung anatomy and physiology related to function, structure, and complications of lung disease. Review of risk factors. Discussion about methods for diagnosing sleep apnea and types of masks and machines for OSA. Includes a review of the use of types of environmental controls: home humidity, furnaces, filters, dust mite/pet prevention, HEPA vacuums. Discussion about weather changes, air quality and  the benefits of nasal washing. Instruction on Warning signs, infection symptoms, calling MD promptly, preventive modes, and value of vaccinations. Review of effective airway clearance, coughing and/or vibration techniques. Emphasizing that all should Create an Action Plan. Written material provided at class time. Flowsheet Row Pulmonary Rehab from 03/22/2024 in [facility] Cardiac and Pulmonary Rehab  Education need identified 03/22/24    AED/CPR: - Group verbal and written instruction with the use of models to demonstrate the basic use of the AED with the basic ABC's of resuscitation.    Tests and Procedures:  - Group verbal  and visual presentation and models provide information about basic cardiac anatomy and function. Reviews the testing methods done to diagnose heart disease and the outcomes of the test results. Describes the treatment choices: Medical Management, Angioplasty, or Coronary Bypass Surgery for treating various heart conditions including Myocardial Infarction, Angina, Valve Disease, and Cardiac Arrhythmias.  Written material provided at class time.   Medication Safety: - Group verbal and visual instruction to review commonly prescribed medications for heart and lung disease. Reviews the medication, class of the drug, and side effects. Includes the steps to properly store meds and maintain the prescription regimen.  Written material given at graduation.   Other: -Provides group and verbal instruction on various topics (see comments)   Knowledge Questionnaire Score:  Knowledge Questionnaire Score - 03/22/24 1313       Knowledge Questionnaire Score   Pre Score 17/18           Core Components/Risk Factors/Patient Goals at Admission:  Personal Goals and Risk Factors at Admission - 03/22/24 1313       Core Components/Risk Factors/Patient Goals on Admission    Weight Management Yes;Weight Maintenance;Weight Loss    Intervention Weight Management: Develop a combined nutrition  and exercise program designed to reach desired caloric intake, while maintaining appropriate intake of nutrient and fiber, sodium and fats, and appropriate energy expenditure required for the weight goal.;Weight Management: Provide education and appropriate resources to help participant work on and attain dietary goals.;Weight Management/Obesity: Establish reasonable short term and long term weight goals.    Admit Weight 178 lb 8 oz (81 kg)    Goal Weight: Short Term 173 lb (78.5 kg)    Goal Weight: Long Term 173 lb (78.5 kg)    Expected Outcomes Short Term: Continue to assess and modify interventions until short term weight is achieved;Long Term: Adherence to nutrition and physical activity/exercise program aimed toward attainment of established weight goal;Understanding recommendations for meals to include 15-35% energy as protein, 25-35% energy from fat, 35-60% energy from carbohydrates, less than 200mg  of dietary cholesterol, 20-35 gm of total fiber daily;Understanding of distribution of calorie intake throughout the day with the consumption of 4-5 meals/snacks;Weight Maintenance: Understanding of the daily nutrition guidelines, which includes 25-35% calories from fat, 7% or less cal from saturated fats, less than 200mg  cholesterol, less than 1.5gm of sodium, & 5 or more servings of fruits and vegetables daily    Hypertension Yes    Intervention Provide education on lifestyle modifcations including regular physical activity/exercise, weight management, moderate sodium restriction and increased consumption of fresh fruit, vegetables, and low fat dairy, alcohol moderation, and smoking cessation.;Monitor prescription use compliance.    Expected Outcomes Short Term: Continued assessment and intervention until BP is < 140/82mm HG in hypertensive participants. < 130/52mm HG in hypertensive participants with diabetes, heart failure or chronic kidney disease.;Long Term: Maintenance of blood pressure at goal  levels.    Lipids Yes    Intervention Provide education and support for participant on nutrition & aerobic/resistive exercise along with prescribed medications to achieve LDL 70mg , HDL >40mg .    Expected Outcomes Short Term: Participant states understanding of desired cholesterol values and is compliant with medications prescribed. Participant is following exercise prescription and nutrition guidelines.;Long Term: Cholesterol controlled with medications as prescribed, with individualized exercise RX and with personalized nutrition plan. Value goals: LDL < 70mg , HDL > 40 mg.          Education:Diabetes - Individual verbal and written instruction to review signs/symptoms of diabetes, desired ranges  of glucose level fasting, after meals and with exercise. Acknowledge that pre and post exercise glucose checks will be done for 3 sessions at entry of program.   Know Your Numbers and Heart Failure: - Group verbal and visual instruction to discuss disease risk factors for cardiac and pulmonary disease and treatment options.  Reviews associated critical values for Overweight/Obesity, Hypertension, Cholesterol, and Diabetes.  Discusses basics of heart failure: signs/symptoms and treatments.  Introduces Heart Failure Zone chart for action plan for heart failure. Written material provided at class time.   Core Components/Risk Factors/Patient Goals Review:    Core Components/Risk Factors/Patient Goals at Discharge (Final Review):    ITP Comments:  ITP Comments     Row Name 03/15/24 1359 03/22/24 1257         ITP Comments Initial phone call completed. Diagnosis can be found in [facility] 9/15. EP Orientation scheduled for Wednesday 11/26 at 10:30am. Completed and gym orientation for respiratory care services. Initial ITP created and sent for review to Dr. Fuad Aleskerov, Medical Director.         Comments: Initial ITP

## 2024-03-28 ENCOUNTER — Encounter

## 2024-03-28 DIAGNOSIS — J432 Centrilobular emphysema: Secondary | ICD-10-CM | POA: Diagnosis present

## 2024-03-28 NOTE — Progress Notes (Signed)
 Daily Session Note  Patient Details  Name: MARQUES ERICSON MRN: [id number] Date of Birth: July 20, 1945 Referring Provider:   Flowsheet Row Pulmonary Rehab from 03/22/2024 in [facility] Cardiac and Pulmonary Rehab  Referring Provider Parris Manna, MD    Encounter Date: 03/28/2024  Check In:  Session Check In - 03/28/24 1106       Check-In   Supervising physician immediately available to respond to emergencies See telemetry face sheet for immediately available ER MD    Location [facility]-Cardiac & Pulmonary Rehab    Staff Present Burnard Davenport RN,BSN,MPA;Maxon Conetta BS, Exercise Physiologist;Noah Tickle, BS, Exercise Physiologist;Meredith Tressa RN,BSN;Jason Elnor RDN,LDN    Virtual Visit No    Medication changes reported     No    Fall or balance concerns reported    No    Warm-up and Cool-down Performed on first and last piece of equipment    Resistance Training Performed Yes    VAD Patient? No    PAD/SET Patient? No      Pain Assessment   Currently in Pain? No/denies             Social History   Tobacco Use  Smoking Status Former   Current packs/day: 0.00   Average packs/day: 1 pack/day for 50.0 years (50.0 ttl pk-yrs)   Types: Cigarettes   Start date: 02/09/1971   Quit date: 02/08/2021   Years since quitting: 3.1  Smokeless Tobacco Never    Goals Met:  Proper associated with RPD/PD & O2 Sat Independence with exercise equipment Using PLB without cueing & demonstrates good technique Exercise tolerated well No report of concerns or symptoms today Strength training completed today  Goals Unmet:  Not Applicable  Comments: First full day of exercise!  Patient was oriented to gym and equipment including functions, settings, policies, and procedures.  Patient's individual exercise prescription and treatment plan were reviewed.  All starting workloads were established based on the results of the 6 minute walk test done at initial orientation visit.  The plan for exercise  progression was also introduced and progression will be customized based on patient's performance and goals.     Dr. Oneil Pinal is Medical Director for [facility] Cardiac Rehabilitation.  Dr. Fuad Aleskerov is Medical Director for [facility] Pulmonary Rehabilitation.

## 2024-03-30 ENCOUNTER — Encounter: Admitting: Emergency Medicine

## 2024-03-30 DIAGNOSIS — J432 Centrilobular emphysema: Secondary | ICD-10-CM

## 2024-03-30 NOTE — Progress Notes (Signed)
 Daily Session Note  Patient Details  Name: TEIGE ROUNTREE MRN: [id number] Date of Birth: 10/06/1945 Referring Provider:   Flowsheet Row Pulmonary Rehab from 03/22/2024 in [facility] Cardiac and Pulmonary Rehab  Referring Provider Parris Manna, MD    Encounter Date: 03/30/2024  Check In:  Session Check In - 03/30/24 1136       Check-In   Supervising physician immediately available to respond to emergencies See telemetry face sheet for immediately available ER MD    Location [facility]-Cardiac & Pulmonary Rehab    Staff Present Leita Franks RN,BSN;Joseph Rolinda RCP,RRT,BSRT;Jason Elnor RDN,LDN;Laureen Delores, BS, RRT, CPFT    Virtual Visit No    Medication changes reported     No    Fall or balance concerns reported    No    Warm-up and Cool-down Performed on first and last piece of equipment    Resistance Training Performed Yes    VAD Patient? No    PAD/SET Patient? No      Pain Assessment   Currently in Pain? No/denies             Social History   Tobacco Use  Smoking Status Former   Current packs/day: 0.00   Average packs/day: 1 pack/day for 50.0 years (50.0 ttl pk-yrs)   Types: Cigarettes   Start date: 02/09/1971   Quit date: 02/08/2021   Years since quitting: 3.1  Smokeless Tobacco Never    Goals Met:  Proper associated with RPD/PD & O2 Sat Independence with exercise equipment Using PLB without cueing & demonstrates good technique Exercise tolerated well No report of concerns or symptoms today Strength training completed today  Goals Unmet:  Not Applicable  Comments: Pt able to follow exercise prescription today without complaint.  Will continue to monitor for progression.    Dr. Oneil Pinal is Medical Director for [facility] Cardiac Rehabilitation.  Dr. Fuad Aleskerov is Medical Director for [facility] Pulmonary Rehabilitation.

## 2024-03-31 ENCOUNTER — Encounter

## 2024-03-31 DIAGNOSIS — J432 Centrilobular emphysema: Secondary | ICD-10-CM | POA: Diagnosis not present

## 2024-03-31 NOTE — Progress Notes (Signed)
 Daily Session Note  Patient Details  Name: Jordan Barber MRN: [id number] Date of Birth: [birth year] Referring Provider:   Flowsheet Row Pulmonary Rehab from 03/22/2024 in [facility] Cardiac and Pulmonary Rehab  Referring Provider Parris Manna, MD    Encounter Date: 03/31/2024  Check In:  Session Check In - 03/31/24 1108       Check-In   Supervising physician immediately available to respond to emergencies See telemetry face sheet for immediately available ER MD    Location [facility]-Cardiac & Pulmonary Rehab    Staff Present Burnard Davenport RN,BSN,MPA;Maxon Conetta BS, Exercise Physiologist;Joseph Rolinda RCP,RRT,BSRT;Laura Cates RN,BSN    Virtual Visit No    Medication changes reported     No    Fall or balance concerns reported    No    Warm-up and Cool-down Performed on first and last piece of equipment    Resistance Training Performed Yes    VAD Patient? No    PAD/SET Patient? No      Pain Assessment   Currently in Pain? No/denies             Social History   Tobacco Use  Smoking Status Former   Current packs/day: 0.00   Average packs/day: 1 pack/day for 50.0 years (50.0 ttl pk-yrs)   Types: Cigarettes   Start date: 02/09/1971   Quit date: 02/08/2021   Years since quitting: 3.1  Smokeless Tobacco Never    Goals Met:  Proper associated with RPD/PD & O2 Sat Independence with exercise equipment Using PLB without cueing & demonstrates good technique Exercise tolerated well No report of concerns or symptoms today Strength training completed today  Goals Unmet:  Not Applicable  Comments: Pt able to follow exercise prescription today without complaint.  Will continue to monitor for progression.    Dr. Oneil Pinal is Medical Director for [facility] Cardiac Rehabilitation.  Dr. Fuad Aleskerov is Medical Director for [facility] Pulmonary Rehabilitation.

## 2024-04-04 ENCOUNTER — Encounter

## 2024-04-04 DIAGNOSIS — J432 Centrilobular emphysema: Secondary | ICD-10-CM | POA: Diagnosis not present

## 2024-04-04 NOTE — Progress Notes (Signed)
 Daily Session Note  Patient Details  Name: Jordan Barber MRN: [id number] Date of Birth: 12/23/1945 Referring Provider:   Flowsheet Row Pulmonary Rehab from 03/22/2024 in [facility] Cardiac and Pulmonary Rehab  Referring Provider Parris Manna, MD    Encounter Date: 04/04/2024  Check In:  Session Check In - 04/04/24 1100       Check-In   Supervising physician immediately available to respond to emergencies See telemetry face sheet for immediately available ER MD    Location [facility]-Cardiac & Pulmonary Rehab    Staff Present Burnard Davenport RN,BSN,MPA;Meredith Tressa RN,BSN;Maxon Burnell BS, Exercise Physiologist;Noah Tickle, BS, Exercise Physiologist    Virtual Visit No    Medication changes reported     No    Fall or balance concerns reported    No    Warm-up and Cool-down Performed on first and last piece of equipment    Resistance Training Performed Yes    VAD Patient? No    PAD/SET Patient? No      Pain Assessment   Currently in Pain? No/denies             Social History   Tobacco Use  Smoking Status Former   Current packs/day: 0.00   Average packs/day: 1 pack/day for 50.0 years (50.0 ttl pk-yrs)   Types: Cigarettes   Start date: 02/09/1971   Quit date: 02/08/2021   Years since quitting: 3.1  Smokeless Tobacco Never    Goals Met:  Proper associated with RPD/PD & O2 Sat Independence with exercise equipment Using PLB without cueing & demonstrates good technique Exercise tolerated well No report of concerns or symptoms today Strength training completed today  Goals Unmet:  Not Applicable  Comments: Pt able to follow exercise prescription today without complaint.  Will continue to monitor for progression.    Dr. Oneil Pinal is Medical Director for [facility] Cardiac Rehabilitation.  Dr. Fuad Aleskerov is Medical Director for [facility] Pulmonary Rehabilitation.

## 2024-04-06 ENCOUNTER — Encounter: Admitting: Emergency Medicine

## 2024-04-06 DIAGNOSIS — J432 Centrilobular emphysema: Secondary | ICD-10-CM | POA: Diagnosis not present

## 2024-04-06 NOTE — Progress Notes (Signed)
 Daily Session Note  Patient Details  Name: Jordan Barber MRN: [id number] Date of Birth: November 17, 1945 Referring Provider:   Flowsheet Row Pulmonary Rehab from 03/22/2024 in [facility] Cardiac and Pulmonary Rehab  Referring Provider Parris Manna, MD    Encounter Date: 04/06/2024  Check In:  Session Check In - 04/06/24 0743       Check-In   Supervising physician immediately available to respond to emergencies See telemetry face sheet for immediately available ER MD    Location [facility]-Cardiac & Pulmonary Rehab    Staff Present Leita Franks RN,BSN;Joseph Rolinda RCP,RRT,BSRT;Kristen Coble RN,BC,MSN    Virtual Visit No    Medication changes reported     No    Fall or balance concerns reported    No    Warm-up and Cool-down Performed on first and last piece of equipment    Resistance Training Performed Yes    VAD Patient? No    PAD/SET Patient? No      Pain Assessment   Currently in Pain? No/denies             Tobacco Use History[1]  Goals Met:  Proper associated with RPD/PD & O2 Sat Independence with exercise equipment Using PLB without cueing & demonstrates good technique Exercise tolerated well No report of concerns or symptoms today Strength training completed today  Goals Unmet:  Not Applicable  Comments: Pt able to follow exercise prescription today without complaint.  Will continue to monitor for progression.    Dr. Oneil Pinal is Medical Director for [facility] Cardiac Rehabilitation.  Dr. Fuad Aleskerov is Medical Director for [facility] Pulmonary Rehabilitation.    [1]  Social History Tobacco Use  Smoking Status Former   Current packs/day: 0.00   Average packs/day: 1 pack/day for 50.0 years (50.0 ttl pk-yrs)   Types: Cigarettes   Start date: 02/09/1971   Quit date: 02/08/2021   Years since quitting: 3.1  Smokeless Tobacco Never

## 2024-04-07 ENCOUNTER — Encounter: Admitting: Emergency Medicine

## 2024-04-07 DIAGNOSIS — J432 Centrilobular emphysema: Secondary | ICD-10-CM | POA: Diagnosis not present

## 2024-04-07 NOTE — Progress Notes (Signed)
 Daily Session Note  Patient Details  Name: Jordan Barber MRN: [id number] Date of Birth: 12-18-1945 Referring Provider:   Flowsheet Row Pulmonary Rehab from 03/22/2024 in [facility] Cardiac and Pulmonary Rehab  Referring Provider Parris Manna, MD    Encounter Date: 04/07/2024  Check In:  Session Check In - 04/07/24 1123       Check-In   Supervising physician immediately available to respond to emergencies See telemetry face sheet for immediately available ER MD    Location [facility]-Cardiac & Pulmonary Rehab    Staff Present Leita Franks RN,BSN;Joseph [facility] BS, Exercise Physiologist;Noah Tickle, BS, Exercise Physiologist    Virtual Visit No    Medication changes reported     No    Fall or balance concerns reported    No    Warm-up and Cool-down Performed on first and last piece of equipment    Resistance Training Performed Yes    VAD Patient? No    PAD/SET Patient? No      Pain Assessment   Currently in Pain? No/denies             Tobacco Use History[1]  Goals Met:  Proper associated with RPD/PD & O2 Sat Independence with exercise equipment Using PLB without cueing & demonstrates good technique Exercise tolerated well No report of concerns or symptoms today Strength training completed today  Goals Unmet:  Not Applicable  Comments: Pt able to follow exercise prescription today without complaint.  Will continue to monitor for progression.    Dr. Oneil Pinal is Medical Director for [facility] Cardiac Rehabilitation.  Dr. Fuad Aleskerov is Medical Director for [facility] Pulmonary Rehabilitation.    [1]  Social History Tobacco Use  Smoking Status Former   Current packs/day: 0.00   Average packs/day: 1 pack/day for 50.0 years (50.0 ttl pk-yrs)   Types: Cigarettes   Start date: 02/09/1971   Quit date: 02/08/2021   Years since quitting: 3.1  Smokeless Tobacco Never

## 2024-04-11 ENCOUNTER — Encounter

## 2024-04-11 ENCOUNTER — Ambulatory Visit

## 2024-04-11 DIAGNOSIS — J432 Centrilobular emphysema: Secondary | ICD-10-CM | POA: Diagnosis not present

## 2024-04-11 DIAGNOSIS — Z0000 Encounter for general adult medical examination without abnormal findings: Secondary | ICD-10-CM | POA: Diagnosis not present

## 2024-04-11 NOTE — Progress Notes (Signed)
 Chief Complaint  Patient presents with   Medicare Wellness     Subjective:   Jordan Barber is a 78 y.o. male who presents for a Medicare Annual Wellness Visit.  Visit info / Clinical Intake: Medicare Wellness Visit Type:: Subsequent Annual Wellness Visit Persons participating in visit and providing information:: patient Medicare Wellness Visit Mode:: Telephone If telephone:: video declined Since this visit was completed virtually, some vitals may be partially provided or unavailable. Missing vitals are due to the limitations of the virtual format.: Unable to obtain vitals - no equipment If Telephone or Video please confirm:: I connected with patient using audio/video enable telemedicine. I verified patient identity with two identifiers, discussed telehealth limitations, and patient agreed to proceed. Patient Location:: HOME Provider Location:: OFFICE Interpreter Needed?: No Pre-visit prep was completed: yes AWV questionnaire completed by patient prior to visit?: yes Date:: 04/07/24 Living arrangements:: lives with spouse/significant other Patient's Overall Health Status Rating: good Typical amount of pain: none Does pain affect daily life?: no Are you currently prescribed opioids?: no  Dietary Habits and Nutritional Risks How many meals a day?: 3 Eats fruit and vegetables daily?: yes Most meals are obtained by: preparing own meals In the last 2 weeks, have you had any of the following?: none Diabetic:: no  Functional Status Activities of Daily Living (to include ambulation/medication): Independent Ambulation: Independent Medication Administration: Independent Home Management (perform basic housework or laundry): Independent Manage your own finances?: yes Primary transportation is: driving Concerns about vision?: no *vision screening is required for [facility]* (WEARS GLASSES ALL DAY- [facility]) Concerns about hearing?: no  Fall Screening Falls in the past year?: 0 Number  of falls in past year: 0 Was there an injury with Fall?: 0 Fall Risk Category Calculator: 0 Patient Fall Risk Level: Low Fall Risk  Fall Risk Patient at Risk for Falls Due to: No Fall Risks Fall risk Follow up: Falls evaluation completed; Falls prevention discussed  Home and Transportation Safety: All rugs have non-skid backing?: (!) no All stairs or steps have railings?: yes Grab bars in the bathtub or shower?: (!) no Have non-skid surface in bathtub or shower?: yes Good home lighting?: yes Regular seat belt use?: yes Hospital stays in the last year:: no  Cognitive Assessment Difficulty concentrating, remembering, or making decisions? : no Will 6CIT or Mini Cog be Completed: yes What year is it?: 0 points What month is it?: 0 points Give patient an address phrase to remember (5 components): [address] [place], [place] About what time is it?: 0 points Count backwards from 20 to 1: 0 points Say the months of the year in reverse: 0 points Repeat the address phrase from earlier: 0 points 6 CIT Score: 0 points  Advance Directives (For Healthcare) Does Patient Have a Medical Advance Directive?: No Does patient want to make changes to medical advance directive?: No - Patient declined Type of Advance Directive: Healthcare Power of [place]; Living will Copy of Healthcare Power of Attorney in Chart?: No - copy requested Would patient like information on creating a medical advance directive?: No - Patient declined  Reviewed/Updated  Reviewed/Updated: Reviewed All (Medical, Surgical, Family, Medications, Allergies, Care Teams, Patient Goals)    Allergies (verified) Amoxicillin   Current Medications (verified) Outpatient Encounter Medications as of 04/11/2024  Medication Sig   albuterol  (VENTOLIN  HFA) 108 (90 Base) MCG/ACT inhaler Inhale 2 puffs into the lungs every 6 (six) hours as needed for wheezing or shortness of breath.   amLODipine  (NORVASC ) 2.5  MG tablet Take 1 tablet  (2.5 mg total) by mouth at bedtime.   aspirin  EC 81 MG EC tablet Take 1 tablet (81 mg total) by mouth daily. Swallow whole.   atorvastatin  (LIPITOR) 40 MG tablet Take 1 tablet (40 mg total) by mouth at bedtime.   clobetasol  (TEMOVATE ) 0.05 % external solution Apply 1 Application topically 2 (two) times daily.   clobetasol  ointment (TEMOVATE ) 0.05 % Apply 1 Application topically 2 (two) times daily as needed (Skin problems).   fluticasone  (FLONASE ) 50 MCG/ACT nasal spray Place 2 sprays into both nostrils daily.   ipratropium (ATROVENT) 0.03 % nasal spray Place 2 sprays into the nose.   sildenafil (REVATIO) 20 MG tablet Take 20 mg by mouth daily as needed (ED).   TRELEGY ELLIPTA  100-62.5-25 MCG/ACT AEPB Inhale 1 puff into the lungs daily.   TRELEGY ELLIPTA  200-62.5-25 MCG/ACT AEPB Inhale 1 puff into the lungs daily.   No facility-administered encounter medications on file as of 04/11/2024.    History: Past Medical History:  Diagnosis Date   Acute blood loss anemia (ABLA)    a.) following hip surgery 01/2021   Acute respiratory failure with hypoxia (HCC) 02/10/2021   a.) following hip surgery in 01/2021   Aortic atherosclerosis    Aortic atherosclerosis    Bilateral carotid artery disease    Bilateral renal cysts    CAD (coronary artery disease)    Cerebral microvascular disease    Cervical spinal stenosis    Cervical spondylosis    COPD (chronic obstructive pulmonary disease) (HCC)    Diastolic dysfunction    Dyspnea    Emphysema of lung (HCC)    Erectile dysfunction    a.) on PDE5i (sildenafil)   History of acute respiratory failure    HLD (hyperlipidemia)    Hypertension    Lacunar cerebrovascular accident (CVA) (HCC)    a.) noted on brain MRI 04/19/2021; LEFT thalamocapsular junction   LAFB (left anterior fascicular block)    Left inguinal hernia    Long-term use of aspirin  therapy    New onset left bundle branch block (LBBB) 11/30/2023   a.) noted on preoperative ECG on  11/30/2023   Prostate cancer [facility])    a.) s/p radical prostatectomy 2011   Pulmonary hypertension (HCC)    Pulmonary nodules    Ramsay Hunt syndrome (geniculate herpes zoster)    Supraumbilical hernia    TIA (transient ischemic attack) 03/2021   Tobacco abuse    Past Surgical History:  Procedure Laterality Date   COLONOSCOPY  10/18/2006   COLONOSCOPY WITH PROPOFOL  N/A 02/03/2017   Procedure: COLONOSCOPY WITH PROPOFOL ;  Surgeon: Dessa Reyes ORN, MD;  Location: [facility];  Service: Endoscopy;  Laterality: N/A;   FRACTURE SURGERY  2022 & 2016   [place], Hip, Wrist   INGUINAL HERNIA REPAIR Left 12/29/2023   Procedure: REPAIR, HERNIA, INGUINAL, ADULT;  Surgeon: Rodolph Romano, MD;  Location: [facility] ORS;  Service: General;  Laterality: Left;   INTRAMEDULLARY (IM) NAIL INTERTROCHANTERIC Right 02/09/2021   Procedure: INTRAMEDULLARY (IM) NAIL INTERTROCHANTRIC;  Surgeon: Edie Norleen PARAS, MD;  Location: [facility] ORS;  Service: Orthopedics;  Laterality: Right;   OPEN REDUCTION INTERNAL FIXATION (ORIF) DISTAL RADIAL FRACTURE Right 02/09/2021   Procedure: OPEN REDUCTION INTERNAL FIXATION (ORIF) DISTAL RADIAL FRACTURE;  Surgeon: Edie Norleen PARAS, MD;  Location: [facility] ORS;  Service: Orthopedics;  Laterality: Right;   ORIF TIBIA PLATEAU Left 11/29/2015   Procedure: OPEN REDUCTION INTERNAL FIXATION (ORIF) TIBIAL PLATEAU;  Surgeon: Norleen PARAS Edie, MD;  Location: [facility] ORS;  Service: Orthopedics;  Laterality: Left;   PROSTATECTOMY N/A 2011   Family History  Problem Relation Age of Onset   Dementia Mother    Heart disease Father    Alcohol abuse Sister    Social History   Occupational History   Not on file  Tobacco Use   Smoking status: Former    Current packs/day: 0.00    Average packs/day: 1 pack/day for 50.0 years (50.0 ttl pk-yrs)    Types: Cigarettes    Start date: 02/09/1971    Quit date: 02/08/2021    Years since quitting: 3.1   Smokeless tobacco: Never  Vaping Use   Vaping status: Never  Used  Substance and Sexual Activity   Alcohol use: Yes    Comment: maybe a 6 pack beer per year, bourbon maybe 2 times a year   Drug use: No   Sexual activity: Not Currently    Birth control/protection: None   Tobacco Counseling Counseling given: Not Answered  SDOH Screenings   Food Insecurity: No Food Insecurity (04/07/2024)  Housing: Low Risk (04/07/2024)  Transportation Needs: No Transportation Needs (04/07/2024)  Utilities: Not At Risk (01/05/2024)   [facility]  Alcohol Screen: Low Risk (04/07/2024)  Depression (PHQ2-9): Low Risk (03/22/2024)  Financial Resource Strain: Low Risk (04/07/2024)  Physical Activity: Sufficiently Active (04/07/2024)  Social Connections: Socially Integrated (04/07/2024)  Stress: No Stress Concern Present (04/07/2024)  Tobacco Use: Medium Risk (04/11/2024)  Health Literacy: Adequate Health Literacy (11/30/2023)   See flowsheets for full screening details  Depression Screen PHQ 2 & 9 Depression Scale- Over the past 2 weeks, how often have you been bothered by any of the following problems? Little interest or pleasure in doing things: 0 Feeling down, depressed, or hopeless (PHQ Adolescent also includes...irritable): 0 PHQ-2 Total Score: 0 Trouble falling or staying asleep, or sleeping too much: 0 Feeling tired or having little energy: 0 Poor appetite or overeating (PHQ Adolescent also includes...weight loss): 0 Feeling bad about yourself - or that you are a failure or have let yourself or your family down: 0 Trouble concentrating on things, such as reading the newspaper or watching television (PHQ Adolescent also includes...like school work): 0 Moving or speaking so slowly that other people could have noticed. Or the opposite - being so fidgety or restless that you have been moving around a lot more than usual: 0 Thoughts that you would be better off dead, or of hurting yourself in some way: 0 PHQ-9 Total Score: 0 If  you checked off any problems, how difficult have these problems made it for you to do your work, take care of things at home, or get along with other people?: Not difficult at all     Goals Addressed             This Visit's Progress    DIET - EAT MORE FRUITS AND VEGETABLES               Objective:    There were no vitals filed for this visit. There is no height or weight on file to calculate BMI.  Hearing/Vision screen No results found. Immunizations and Health Maintenance Health Maintenance  Topic Date Due   Hepatitis C Screening  Never done   DTaP/Tdap/Td (1 - Tdap) Never done   Pneumococcal Vaccine: 50+ Years (1 of 2 - PCV) Never done   Zoster Vaccines- Shingrix (1 of 2) Never done   Influenza Vaccine  11/26/2023  COVID-19 Vaccine (4 - 2025-26 season) 12/27/2023   Lung Cancer Screening  06/17/2024   Medicare Annual Wellness (AWV)  04/11/2025   Meningococcal B Vaccine  Aged Out   Colonoscopy  Discontinued        Assessment/Plan:  This is a routine wellness examination for Jordan Barber.  Patient Care Team: Donzella Lauraine SAILOR, DO as PCP - General (Family Medicine) [facility], [facility])  I have personally reviewed and noted the following in the patients chart:   Medical and social history Use of alcohol, tobacco or illicit drugs  Current medications and supplements including opioid prescriptions. Functional ability and status Nutritional status Physical activity Advanced directives List of other physicians Hospitalizations, surgeries, and ER visits in previous 12 months Vitals Screenings to include cognitive, depression, and falls Referrals and appointments  No orders of the defined types were placed in this encounter.  In addition, I have reviewed and discussed with patient certain preventive protocols, quality metrics, and best practice recommendations. A written personalized care plan for preventive services as well as general preventive health  recommendations were provided to patient.   Jordan GORMAN Das, LPN   [id number]/[id number]   Return in 1 year (on 04/11/2025).  After Visit Summary: (MyChart) Due to this being a telephonic visit, the after visit summary with patients personalized plan was offered to patient via MyChart   Nurse Notes: NEEDS SHINGRIX, TDAP, PNA, FLU; AGED OUT OF COLONOSCOPY; NEEDS LUNG CA SCREENING (HAS REFERRAL ALREADY)

## 2024-04-11 NOTE — Patient Instructions (Addendum)
 Mr. Demetriou,  Thank you for taking the time for your Medicare Wellness Visit. I appreciate your continued commitment to your health goals. Please review the care plan we discussed, and feel free to reach out if I can assist you further.  Please note that Annual Wellness Visits do not include a physical exam. Some assessments may be limited, especially if the visit was conducted virtually. If needed, we may recommend an in-person follow-up with your provider.  Ongoing Care Seeing your primary care provider every 3 to 6 months helps us  monitor your health and provide consistent, personalized care.   Referrals If a referral was made during today's visit and you haven't received any updates within two weeks, please contact the referred provider directly to check on the status.  Recommended Screenings:  Health Maintenance  Topic Date Due   Hepatitis C Screening  Never done   DTaP/Tdap/Td vaccine (1 - Tdap) Never done   Pneumococcal Vaccine for age over 12 (1 of 2 - PCV) Never done   Zoster (Shingles) Vaccine (1 of 2) Never done   Flu Shot  11/26/2023   COVID-19 Vaccine (4 - 2025-26 season) 12/27/2023   Screening for Lung Cancer  06/17/2024   Medicare Annual Wellness Visit  04/11/2025   Meningitis B Vaccine  Aged Out   Colon Cancer Screening  Discontinued    Vision: Annual vision screenings are recommended for early detection of glaucoma, cataracts, and diabetic retinopathy. These exams can also reveal signs of chronic conditions such as diabetes and high blood pressure.  Dental: Annual dental screenings help detect early signs of oral cancer, gum disease, and other conditions linked to overall health, including heart disease and diabetes.  Please see the attached documents for additional preventive care recommendations.   NEXT AWV 04/17/25 @ 11:30 AM BY VIDEO

## 2024-04-11 NOTE — Progress Notes (Signed)
 Daily Session Note  Patient Details  Name: Jordan Barber MRN: [id number] Date of Birth: Jun 18, 1945 Referring Provider:   Flowsheet Row Pulmonary Rehab from 03/22/2024 in [facility]  Referring Provider Parris Manna, MD    Encounter Date: 04/11/2024  Check In:  Session Check In - 04/11/24 1059       Check-In   Supervising physician immediately available to respond to emergencies See telemetry face sheet for immediately available ER MD    Location [facility]    Staff Present Burnard Davenport RN,BSN,MPA;Maxon Conetta BS, Exercise Physiologist;Margaret Best, MS, Exercise Physiologist;Meredith Tressa RN,BSN;Jason Elnor RDN,LDN    Virtual Visit No    Medication changes reported     No    Fall or balance concerns reported    No    Warm-up and Cool-down Performed on first and last piece of equipment    Resistance Training Performed Yes    VAD Patient? No    PAD/SET Patient? No      Pain Assessment   Currently in Pain? No/denies             Tobacco Use History[1]  Goals Met:  Proper associated with RPD/PD & O2 Sat Independence with exercise equipment Using PLB without cueing & demonstrates good technique Exercise tolerated well No report of concerns or symptoms today Strength training completed today  Goals Unmet:  Not Applicable  Comments: Pt able to follow exercise prescription today without complaint.  Will continue to monitor for progression.    Dr. Oneil Pinal is Medical Director for [facility] Cardiac Rehabilitation.  Dr. Fuad Aleskerov is Medical Director for [facility] Pulmonary Rehabilitation.    [1]  Social History Tobacco Use  Smoking Status Former   Current packs/day: 0.00   Average packs/day: 1 pack/day for 50.0 years (50.0 ttl pk-yrs)   Types: Cigarettes   Start date: 02/09/1971   Quit date: 02/08/2021   Years since quitting: 3.1  Smokeless Tobacco Never

## 2024-04-12 DIAGNOSIS — J432 Centrilobular emphysema: Secondary | ICD-10-CM

## 2024-04-12 NOTE — Progress Notes (Signed)
 Pulmonary Individual Treatment Plan  Patient Details  Name: Jordan Barber MRN: [id number] Date of Birth: [birth year] Referring Provider:   Conrad Ports Pulmonary Rehab from 03/22/2024 in [facility] Cardiac and Pulmonary Rehab  Referring Provider Parris Manna, MD    Initial Encounter Date:  Flowsheet Row Pulmonary Rehab from 03/22/2024 in [facility] Cardiac and Pulmonary Rehab  Date 03/22/24    Visit Diagnosis: Centrilobular emphysema (HCC)  Patient's Home Medications on Admission: Current Medications[1]  Past Medical History: Past Medical History:  Diagnosis Date   Acute blood loss anemia (ABLA)    a.) following hip surgery 01/2021   Acute respiratory failure with hypoxia (HCC) 02/10/2021   a.) following hip surgery in 01/2021   Aortic atherosclerosis    Aortic atherosclerosis    Bilateral carotid artery disease    Bilateral renal cysts    CAD (coronary artery disease)    Cerebral microvascular disease    Cervical spinal stenosis    Cervical spondylosis    COPD (chronic obstructive pulmonary disease) (HCC)    Diastolic dysfunction    Dyspnea    Emphysema of lung (HCC)    Erectile dysfunction    a.) on PDE5i (sildenafil)   History of acute respiratory failure    HLD (hyperlipidemia)    Hypertension    Lacunar cerebrovascular accident (CVA) (HCC)    a.) noted on brain MRI 04/19/2021; LEFT thalamocapsular junction   LAFB (left anterior fascicular block)    Left inguinal hernia    Long-term use of aspirin  therapy    New onset left bundle branch block (LBBB) 11/30/2023   a.) noted on preoperative ECG on 11/30/2023   Prostate cancer [facility])    a.) s/p radical prostatectomy 2011   Pulmonary hypertension (HCC)    Pulmonary nodules    Ramsay Hunt syndrome (geniculate herpes zoster)    Supraumbilical hernia    TIA (transient ischemic attack) 03/2021   Tobacco abuse     Tobacco Use: Tobacco Use History[2]  Labs: Review Flowsheet       Latest Ref Rng & Units 04/19/2021   Labs for ITP Cardiac and Pulmonary Rehab  Cholestrol 0 - 200 mg/dL 809   LDL (calc) 0 - 99 mg/dL 882   HDL-C >59 mg/dL 60   Trlycerides <849 mg/dL 65   Hemoglobin J8r 4.8 - 5.6 % 5.4      Pulmonary Assessment Scores:  Pulmonary Assessment Scores     Row Name 03/22/24 1310         ADL UCSD   ADL Phase Entry     SOB Score total 32     Rest 0     Walk 1     Stairs 2     Bath 1     Dress 1     Shop 2       CAT Score   CAT Score 14       mMRC Score   mMRC Score 0        UCSD: Self-administered rating of dyspnea associated with activities of daily living (ADLs) 6-point scale (0 = not at all to 5 = maximal or unable to do because of breathlessness)  Scoring Scores Sautter from 0 to 120.  Minimally important difference is 5 units  CAT: CAT can identify the health impairment of COPD patients and is better correlated with disease progression.  CAT has a scoring Napoleon of zero to 40. The CAT score is classified into four groups of low (less than 10), medium (10 -  20), high (21-30) and very high (31-40) based on the impact level of disease on health status. A CAT score over 10 suggests significant symptoms.  A worsening CAT score could be explained by an exacerbation, poor medication adherence, poor inhaler technique, or progression of COPD or comorbid conditions.  CAT MCID is 2 points  mMRC: mMRC (Modified Medical Research Council) Dyspnea Scale is used to assess the degree of baseline functional disability in patients of respiratory disease due to dyspnea. No minimal important difference is established. A decrease in score of 1 point or greater is considered a positive change.   Pulmonary Function Assessment:   Exercise Target Goals: Exercise Program Goal: Individual exercise prescription set using results from initial 6 min walk test and THRR while considering  patients activity barriers and safety.   Exercise Prescription Goal: Initial exercise prescription builds  to 30-45 minutes a day of aerobic activity, 2-3 days per week.  Home exercise guidelines will be given to patient during program as part of exercise prescription that the participant will acknowledge.  Education: Aerobic Exercise: - Group verbal and visual presentation on the components of exercise prescription. Introduces F.I.T.T principle from ACSM for exercise prescriptions.  Reviews F.I.T.T. principles of aerobic exercise including progression. Written material provided at class time.   Education: Resistance Exercise: - Group verbal and visual presentation on the components of exercise prescription. Introduces F.I.T.T principle from ACSM for exercise prescriptions  Reviews F.I.T.T. principles of resistance exercise including progression. Written material provided at class time.    Education: Exercise & Equipment Safety: - Individual verbal instruction and demonstration of equipment use and safety with use of the equipment. Flowsheet Row Pulmonary Rehab from 04/06/2024 in [facility] Cardiac and Pulmonary Rehab  Date 03/22/24  Educator MB  Instruction Review Code 1- Verbalizes Understanding    Education: Exercise Physiology & General Exercise Guidelines: - Group verbal and written instruction with models to review the exercise physiology of the cardiovascular system and associated critical values. Provides general exercise guidelines with specific guidelines to those with heart or lung disease.    Education: Flexibility, Balance, Mind/Body Relaxation: - Group verbal and visual presentation with interactive activity on the components of exercise prescription. Introduces F.I.T.T principle from ACSM for exercise prescriptions. Reviews F.I.T.T. principles of flexibility and balance exercise training including progression. Also discusses the mind body connection.  Reviews various relaxation techniques to help reduce and manage stress (i.e. Deep breathing, progressive muscle relaxation, and  visualization). Balance handout provided to take home. Written material provided at class time.   Activity Barriers & Risk Stratification:  Activity Barriers & Cardiac Risk Stratification - 03/22/24 1302       Activity Barriers & Cardiac Risk Stratification   Activity Barriers None          6 Minute Walk:  6 Minute Walk     Row Name 03/22/24 1257         6 Minute Walk   Phase Initial     Distance 1165 feet     Walk Time 6 minutes     # of Rest Breaks 0     MPH 2.21     METS 2.61     RPE 9     Perceived Dyspnea  1     VO2 Peak 9.14     Symptoms No     Resting HR 75 bpm     Resting BP 148/82     Resting Oxygen Saturation  91 %     Exercise Oxygen  Saturation  during 6 min walk 87 %     Max Ex. HR 96 bpm     Max Ex. BP 146/84     2 Minute Post BP 136/80       Interval HR   1 Minute HR 90     2 Minute HR 93     3 Minute HR 96     4 Minute HR 96     5 Minute HR 91     6 Minute HR 92     2 Minute Post HR 73     Interval Heart Rate? Yes       Interval Oxygen   Interval Oxygen? Yes     Baseline Oxygen Saturation % 91 %     1 Minute Oxygen Saturation % 90 %     1 Minute Liters of Oxygen 4 L     2 Minute Oxygen Saturation % 88 %     2 Minute Liters of Oxygen 4 L     3 Minute Oxygen Saturation % 87 %     3 Minute Liters of Oxygen 4 L     4 Minute Oxygen Saturation % 87 %     4 Minute Liters of Oxygen 4 L     5 Minute Oxygen Saturation % 87 %     5 Minute Liters of Oxygen 4 L     6 Minute Oxygen Saturation % 87 %     6 Minute Liters of Oxygen 4 L     2 Minute Post Oxygen Saturation % 96 %     2 Minute Post Liters of Oxygen 73 L       Oxygen Initial Assessment:  Oxygen Initial Assessment - 03/15/24 1338       Home Oxygen   Home Oxygen Device E-Tanks;Portable Concentrator    Home Exercise Oxygen Prescription Continuous    Liters per minute 4    Home Resting Oxygen Prescription Continuous    Liters per minute 3    Compliance with Home Oxygen Use Yes       Intervention   Short Term Goals To learn and understand importance of monitoring SPO2 with pulse oximeter and demonstrate accurate use of the pulse oximeter.;To learn and understand importance of maintaining oxygen saturations>88%;To learn and demonstrate proper pursed lip breathing techniques or other breathing techniques. ;To learn and demonstrate proper use of respiratory medications;To learn and exhibit compliance with exercise, home and travel O2 prescription    Long  Term Goals Verbalizes importance of monitoring SPO2 with pulse oximeter and return demonstration;Maintenance of O2 saturations>88%;Exhibits proper breathing techniques, such as pursed lip breathing or other method taught during program session;Compliance with respiratory medication;Demonstrates proper use of MDIs;Exhibits compliance with exercise, home  and travel O2 prescription          Oxygen Re-Evaluation:  Oxygen Re-Evaluation     Row Name 03/28/24 1109             Home Oxygen   Home Oxygen Device E-Tanks;Portable Concentrator       Home Exercise Oxygen Prescription Continuous       Liters per minute 4       Home Resting Oxygen Prescription Continuous       Liters per minute 3       Compliance with Home Oxygen Use Yes         Goals/Expected Outcomes   Short Term Goals To learn and understand importance of monitoring SPO2 with pulse oximeter  and demonstrate accurate use of the pulse oximeter.;To learn and understand importance of maintaining oxygen saturations>88%;To learn and demonstrate proper pursed lip breathing techniques or other breathing techniques. ;To learn and demonstrate proper use of respiratory medications;To learn and exhibit compliance with exercise, home and travel O2 prescription       Long  Term Goals Verbalizes importance of monitoring SPO2 with pulse oximeter and return demonstration;Maintenance of O2 saturations>88%;Exhibits proper breathing techniques, such as pursed lip breathing or other  method taught during program session;Compliance with respiratory medication;Demonstrates proper use of MDIs;Exhibits compliance with exercise, home  and travel O2 prescription       Comments Reviewed PLB technique with pt.  Talked about how it works and it's importance in maintaining their exercise saturations.       Goals/Expected Outcomes Short: Become more profiecient at using PLB. Long: Become independent at using PLB.          Oxygen Discharge (Final Oxygen Re-Evaluation):  Oxygen Re-Evaluation - 03/28/24 1109       Home Oxygen   Home Oxygen Device E-Tanks;Portable Concentrator    Home Exercise Oxygen Prescription Continuous    Liters per minute 4    Home Resting Oxygen Prescription Continuous    Liters per minute 3    Compliance with Home Oxygen Use Yes      Goals/Expected Outcomes   Short Term Goals To learn and understand importance of monitoring SPO2 with pulse oximeter and demonstrate accurate use of the pulse oximeter.;To learn and understand importance of maintaining oxygen saturations>88%;To learn and demonstrate proper pursed lip breathing techniques or other breathing techniques. ;To learn and demonstrate proper use of respiratory medications;To learn and exhibit compliance with exercise, home and travel O2 prescription    Long  Term Goals Verbalizes importance of monitoring SPO2 with pulse oximeter and return demonstration;Maintenance of O2 saturations>88%;Exhibits proper breathing techniques, such as pursed lip breathing or other method taught during program session;Compliance with respiratory medication;Demonstrates proper use of MDIs;Exhibits compliance with exercise, home  and travel O2 prescription    Comments Reviewed PLB technique with pt.  Talked about how it works and it's importance in maintaining their exercise saturations.    Goals/Expected Outcomes Short: Become more profiecient at using PLB. Long: Become independent at using PLB.          Initial Exercise  Prescription:  Initial Exercise Prescription - 03/22/24 1300       Date of Initial Exercise RX and Referring Provider   Date 03/22/24    Referring Provider Aleskerov, Fuad, MD      Oxygen   Oxygen Continuous    Liters 4    Maintain Oxygen Saturation 88% or higher      Treadmill   MPH 2.2    Grade 0    Minutes 15    METs 2.68      NuStep   Level 2    SPM 80    Minutes 15    METs 2.61      REL-XR   Level 1    Speed 50    Minutes 15    METs 2.61      T5 Nustep   Level 2    SPM 80    Minutes 15    METs 2.61      Rower   Level 2    Watts 20    Minutes 15    METs 2.61      Prescription Details   Frequency (times per week) 3  Duration Progress to 30 minutes of continuous aerobic without signs/symptoms of physical distress      Intensity   THRR 40-80% of Max Heartrate 100-129    Ratings of Perceived Exertion 11-13    Perceived Dyspnea 0-4      Progression   Progression Continue to progress workloads to maintain intensity without signs/symptoms of physical distress.      Resistance Training   Training Prescription Yes    Weight 7 lb    Reps 10-15          Perform Capillary Blood Glucose checks as needed.  Exercise Prescription Changes:   Exercise Prescription Changes     Row Name 03/22/24 1300 04/05/24 0800           Response to Exercise   Blood Pressure (Admit) 148/82 124/62      Blood Pressure (Exercise) 146/84 170/80      Blood Pressure (Exit) 136/80 110/66      Heart Rate (Admit) 75 bpm 72 bpm      Heart Rate (Exercise) 96 bpm 96 bpm      Heart Rate (Exit) 73 bpm 97 bpm      Oxygen Saturation (Admit) 91 %  Room air 95 %      Oxygen Saturation (Exercise) 87 %  4L O2 88 %      Oxygen Saturation (Exit) 96 %  4L O2 90 %      Rating of Perceived Exertion (Exercise) 9 15      Perceived Dyspnea (Exercise) 1 3      Symptoms none none      Comments results first 2 weeks of exercise      Duration -- Progress to 30 minutes of  aerobic  without signs/symptoms of physical distress      Intensity -- THRR unchanged        Progression   Progression -- Continue to progress workloads to maintain intensity without signs/symptoms of physical distress.      Average METs 2.61 2.75        Resistance Training   Training Prescription -- Yes      Weight -- 7lb      Reps -- 10-15        Interval Training   Interval Training -- No        Oxygen   Oxygen -- Continuous      Liters -- 4        Treadmill   MPH -- 1.6      Grade -- 0      Minutes -- 15      METs -- 2.23        NuStep   Level -- 4  T6      Minutes -- 15      METs -- 2.3        REL-XR   Level -- 2      Minutes -- 15      METs -- 3.8        Rower   Level -- 5      Watts -- 15      Minutes -- 15      METs -- 4.19        Oxygen   Maintain Oxygen Saturation -- 88% or higher         Exercise Comments:   Exercise Comments     Row Name 03/28/24 1108           Exercise Comments First full  day of exercise!  Patient was oriented to gym and equipment including functions, settings, policies, and procedures.  Patient's individual exercise prescription and treatment plan were reviewed.  All starting workloads were established based on the results of the 6 minute walk test done at initial orientation visit.  The plan for exercise progression was also introduced and progression will be customized based on patient's performance and goals.          Exercise Goals and Review:   Exercise Goals     Row Name 03/22/24 1309             Exercise Goals   Increase Physical Activity Yes       Intervention Provide advice, education, support and counseling about physical activity/exercise needs.;Develop an individualized exercise prescription for aerobic and resistive training based on initial evaluation findings, risk stratification, comorbidities and participant's personal goals.       Expected Outcomes Short Term: Attend rehab on a regular basis to increase  amount of physical activity.;Long Term: Add in home exercise to make exercise part of routine and to increase amount of physical activity.;Long Term: Exercising regularly at least 3-5 days a week.       Increase Strength and Stamina Yes       Intervention Provide advice, education, support and counseling about physical activity/exercise needs.;Develop an individualized exercise prescription for aerobic and resistive training based on initial evaluation findings, risk stratification, comorbidities and participant's personal goals.       Expected Outcomes Short Term: Increase workloads from initial exercise prescription for resistance, speed, and METs.;Short Term: Perform resistance training exercises routinely during rehab and add in resistance training at home;Long Term: Improve cardiorespiratory fitness, muscular endurance and strength as measured by increased METs and functional capacity ( )       Able to understand and use rate of perceived exertion (RPE) scale Yes       Intervention Provide education and explanation on how to use RPE scale       Expected Outcomes Short Term: Able to use RPE daily in rehab to express subjective intensity level;Long Term:  Able to use RPE to guide intensity level when exercising independently       Able to understand and use Dyspnea scale Yes       Intervention Provide education and explanation on how to use Dyspnea scale       Expected Outcomes Long Term: Able to use Dyspnea scale to guide intensity level when exercising independently;Short Term: Able to use Dyspnea scale daily in rehab to express subjective sense of shortness of breath during exertion       Knowledge and understanding of Target Heart Rate [place] (THRR) Yes       Intervention Provide education and explanation of THRR including how the numbers were predicted and where they are located for reference       Expected Outcomes Short Term: Able to state/look up THRR;Short Term: Able to use daily as  guideline for intensity in rehab;Long Term: Able to use THRR to govern intensity when exercising independently       Able to check pulse independently Yes       Intervention Review the importance of being able to check your own pulse for safety during independent exercise;Provide education and demonstration on how to check pulse in carotid and radial arteries.       Expected Outcomes Short Term: Able to explain why pulse checking is important during independent exercise;Long Term: Able to check pulse independently  and accurately       Understanding of Exercise Prescription Yes       Intervention Provide education, explanation, and written materials on patient's individual exercise prescription       Expected Outcomes Short Term: Able to explain program exercise prescription;Long Term: Able to explain home exercise prescription to exercise independently          Exercise Goals Re-Evaluation :  Exercise Goals Re-Evaluation     Row Name 03/28/24 1108 04/05/24 0821           Exercise Goal Re-Evaluation   Exercise Goals Review Able to understand and use rate of perceived exertion (RPE) scale;Knowledge and understanding of Target Heart Rate [place] (THRR);Understanding of Exercise Prescription;Increase Physical Activity;Increase Strength and Stamina;Able to understand and use Dyspnea scale;Able to check pulse independently Increase Physical Activity;Increase Strength and Stamina;Understanding of Exercise Prescription      Comments Reviewed RPE and dyspnea scale, THR and program prescription with pt today.  Pt voiced understanding and was given a copy of goals to take home. Lorrene is off to a great start in the program, and was able to attend his first 3 sessions during this review period. During these sessions he was able to use the treadmill at a workload of 1. and 0% incline. He was also able to use the T6 nustep at level 4 and the rowing machine at level 5. We will continue to monitor his progress  in the program.      Expected Outcomes Short: Use RPE daily to regulate intensity. Long: Follow program prescription in THR. Short: Continue to follow exercise prescription. Long: Continue exercise to improve strength and stamina.         Discharge Exercise Prescription (Final Exercise Prescription Changes):  Exercise Prescription Changes - 04/05/24 0800       Response to Exercise   Blood Pressure (Admit) 124/62    Blood Pressure (Exercise) 170/80    Blood Pressure (Exit) 110/66    Heart Rate (Admit) 72 bpm    Heart Rate (Exercise) 96 bpm    Heart Rate (Exit) 97 bpm    Oxygen Saturation (Admit) 95 %    Oxygen Saturation (Exercise) 88 %    Oxygen Saturation (Exit) 90 %    Rating of Perceived Exertion (Exercise) 15    Perceived Dyspnea (Exercise) 3    Symptoms none    Comments first 2 weeks of exercise    Duration Progress to 30 minutes of  aerobic without signs/symptoms of physical distress    Intensity THRR unchanged      Progression   Progression Continue to progress workloads to maintain intensity without signs/symptoms of physical distress.    Average METs 2.75      Resistance Training   Training Prescription Yes    Weight 7lb    Reps 10-15      Interval Training   Interval Training No      Oxygen   Oxygen Continuous    Liters 4      Treadmill   MPH 1.6    Grade 0    Minutes 15    METs 2.23      NuStep   Level 4   T6   Minutes 15    METs 2.3      REL-XR   Level 2    Minutes 15    METs 3.8      Rower   Level 5    Watts 15    Minutes 15  METs 4.19      Oxygen   Maintain Oxygen Saturation 88% or higher          Nutrition:  Target Goals: Understanding of nutrition guidelines, daily intake of sodium 1500mg , cholesterol 200mg , calories 30% from fat and 7% or less from saturated fats, daily to have 5 or more servings of fruits and vegetables.  Education: Nutrition 1 -Group instruction provided by verbal, written material, interactive  activities, discussions, models, and posters to present general guidelines for heart healthy nutrition including macronutrients, label reading, and promoting whole foods over processed counterparts. Education serves as pensions consultant of discussion of heart healthy eating for all. Written material provided at class time.     Education: Nutrition 2 -Group instruction provided by verbal, written material, interactive activities, discussions, models, and posters to present general guidelines for heart healthy nutrition including sodium, cholesterol, and saturated fat. Providing guidance of habit forming to improve blood pressure, cholesterol, and body weight. Written material provided at class time.     Biometrics:  Pre Biometrics - 03/22/24 1309       Pre Biometrics   Height 5' 10.6 (1.793 m)    Weight 178 lb 8 oz (81 kg)    Waist Circumference 39.3 inches    Hip Circumference 43.8 inches    Waist to Hip Ratio 0.9 %    BMI (Calculated) 25.19    Single Leg Stand 2.7 seconds           Nutrition Therapy Plan and Nutrition Goals:  Nutrition Therapy & Goals - 03/22/24 1312       Personal Nutrition Goals   Nutrition Goal Will schedule RD appointment in a few weeks after talking with his wife.      Intervention Plan   Intervention Prescribe, educate and counsel regarding individualized specific dietary modifications aiming towards targeted core components such as weight, hypertension, lipid management, diabetes, heart failure and other comorbidities.    Expected Outcomes Short Term Goal: Understand basic principles of dietary content, such as calories, fat, sodium, cholesterol and nutrients.          Nutrition Assessments:  MEDIFICTS Score Key: >=70 Need to make dietary changes  40-70 Heart Healthy Diet <= 40 Therapeutic Level Cholesterol Diet  Flowsheet Row Pulmonary Rehab from 03/22/2024 in [facility] Cardiac and Pulmonary Rehab  Picture Your Plate Total Score on Admission 63    Picture Your Plate Scores: <59 Unhealthy dietary pattern with much room for improvement. 41-50 Dietary pattern unlikely to meet recommendations for good health and room for improvement. 51-60 More healthful dietary pattern, with some room for improvement.  >60 Healthy dietary pattern, although there may be some specific behaviors that could be improved.   Nutrition Goals Re-Evaluation:   Nutrition Goals Discharge (Final Nutrition Goals Re-Evaluation):   Psychosocial: Target Goals: Acknowledge presence or absence of significant depression and/or stress, maximize coping skills, provide positive support system. Participant is able to verbalize types and ability to use techniques and skills needed for reducing stress and depression.   Education: Stress, Anxiety, and Depression - Group verbal and visual presentation to define topics covered.  Reviews how body is impacted by stress, anxiety, and depression.  Also discusses healthy ways to reduce stress and to treat/manage anxiety and depression.  Written material provided at class time.   Education: Sleep Hygiene -Provides group verbal and written instruction about how sleep can affect your health.  Define sleep hygiene, discuss sleep cycles and impact of sleep habits. Review good sleep hygiene tips.  Initial Review & Psychosocial Screening:  Initial Psych Review & Screening - 03/15/24 1345       Initial Review   Current issues with None Identified      Family Dynamics   Good Support System? Yes      Barriers   Psychosocial barriers to participate in program There are no identifiable barriers or psychosocial needs.      Screening Interventions   Interventions Encouraged to exercise;Provide feedback about the scores to participant    Expected Outcomes Short Term goal: Utilizing psychosocial counselor, staff and physician to assist with identification of specific Stressors or current issues interfering with healing process.  Setting desired goal for each stressor or current issue identified.;Long Term Goal: Stressors or current issues are controlled or eliminated.;Short Term goal: Identification and review with participant of any Quality of Life or Depression concerns found by scoring the questionnaire.;Long Term goal: The participant improves quality of Life and PHQ9 Scores as seen by post scores and/or verbalization of changes          Quality of Life Scores:  Scores of 19 and below usually indicate a poorer quality of life in these areas.  A difference of  2-3 points is a clinically meaningful difference.  A difference of 2-3 points in the total score of the Quality of Life Index has been associated with significant improvement in overall quality of life, self-image, physical symptoms, and general health in studies assessing change in quality of life.  PHQ-9: Review Flowsheet       04/11/2024 03/22/2024 11/30/2023  Depression screen PHQ 2/9  Decreased Interest 0 0 0  Down, Depressed, Hopeless 0 0 0  PHQ - 2 Score 0 0 0  Altered sleeping 0 0 0  Tired, decreased energy 0 0 0  Change in appetite 0 0 0  Feeling bad or failure about yourself  0 0 0  Trouble concentrating 0 0 0  Moving slowly or fidgety/restless 0 0 0  Suicidal thoughts 0 0 0  PHQ-9 Score 0 0 0   Difficult doing work/chores Not difficult at all Not difficult at all Not difficult at all    Details       Data saved with a previous flowsheet row definition        Interpretation of Total Score  Total Score Depression Severity:  1-4 = Minimal depression, 5-9 = Mild depression, 10-14 = Moderate depression, 15-19 = Moderately severe depression, 20-27 = Severe depression   Psychosocial Evaluation and Intervention:  Psychosocial Evaluation - 03/15/24 1401       Psychosocial Evaluation & Interventions   Interventions Encouraged to exercise with the program and follow exercise prescription    Comments 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 He states he has no current stress concerns. His wife is his main support system. He has been wearing his oxygen more consistently and notes a difference in his symptoms. His doctor was pleased at his last visit with how his medication is working. He is looking forward to attending the program to work on his stamina.    Expected Outcomes Short: attend pulmonary rehab for education and exercise Long: develop and maintain positive self care habits    Continue Psychosocial Services  Follow up required by staff          Psychosocial Re-Evaluation:   Psychosocial Discharge (Final Psychosocial Re-Evaluation):   Education: Education Goals: Education classes will be provided on a weekly basis, covering required topics. Participant will state  understanding/return demonstration of topics presented.  Learning Barriers/Preferences:  Learning Barriers/Preferences - 03/15/24 1346       Learning Barriers/Preferences   Learning Barriers None    Learning Preferences Individual Instruction          General Pulmonary Education Topics:  Infection Prevention: - Provides verbal and written material to individual with discussion of infection control including proper hand washing and proper equipment cleaning during exercise session. Flowsheet Row Pulmonary Rehab from 04/06/2024 in [facility] Cardiac and Pulmonary Rehab  Date 03/22/24  Educator MB  Instruction Review Code 1- Verbalizes Understanding    Falls Prevention: - Provides verbal and written material to individual with discussion of falls prevention and safety. Flowsheet Row Pulmonary Rehab from 04/06/2024 in [facility] Cardiac and Pulmonary Rehab  Date 03/22/24  Educator MB  Instruction Review Code 1- Verbalizes Understanding    Chronic Lung Disease Review: - Group verbal instruction with posters, models, PowerPoint presentations and videos,  to review new updates, new respiratory medications, new advancements in procedures and  treatments. Providing information on websites and 800 numbers for continued self-education. Includes information about supplement oxygen, available portable oxygen systems, continuous and intermittent flow rates, oxygen safety, concentrators, and Medicare reimbursement for oxygen. Explanation of Pulmonary Drugs, including class, frequency, complications, importance of spacers, rinsing mouth after steroid MDI's, and proper cleaning methods for nebulizers. Review of basic lung anatomy and physiology related to function, structure, and complications of lung disease. Review of risk factors. Discussion about methods for diagnosing sleep apnea and types of masks and machines for OSA. Includes a review of the use of types of environmental controls: home humidity, furnaces, filters, dust mite/pet prevention, HEPA vacuums. Discussion about weather changes, air quality and the benefits of nasal washing. Instruction on Warning signs, infection symptoms, calling MD promptly, preventive modes, and value of vaccinations. Review of effective airway clearance, coughing and/or vibration techniques. Emphasizing that all should Create an Action Plan. Written material provided at class time. Flowsheet Row Pulmonary Rehab from 04/06/2024 in [facility] Cardiac and Pulmonary Rehab  Education need identified 03/22/24    AED/CPR: - Group verbal and written instruction with the use of models to demonstrate the basic use of the AED with the basic ABC's of resuscitation.    Tests and Procedures:  - Group verbal and visual presentation and models provide information about basic cardiac anatomy and function. Reviews the testing methods done to diagnose heart disease and the outcomes of the test results. Describes the treatment choices: Medical Management, Angioplasty, or Coronary Bypass Surgery for treating various heart conditions including Myocardial Infarction, Angina, Valve Disease, and Cardiac Arrhythmias.  Written material provided  at class time.   Medication Safety: - Group verbal and visual instruction to review commonly prescribed medications for heart and lung disease. Reviews the medication, class of the drug, and side effects. Includes the steps to properly store meds and maintain the prescription regimen.  Written material given at graduation.   Other: -Provides group and verbal instruction on various topics (see comments)   Knowledge Questionnaire Score:  Knowledge Questionnaire Score - 03/22/24 1313       Knowledge Questionnaire Score   Pre Score 17/18           Core Components/Risk Factors/Patient Goals at Admission:  Personal Goals and Risk Factors at Admission - 03/22/24 1313       Core Components/Risk Factors/Patient Goals on Admission    Weight Management Yes;Weight Maintenance;Weight Loss    Intervention Weight Management: Develop a combined nutrition and exercise  program designed to reach desired caloric intake, while maintaining appropriate intake of nutrient and fiber, sodium and fats, and appropriate energy expenditure required for the weight goal.;Weight Management: Provide education and appropriate resources to help participant work on and attain dietary goals.;Weight Management/Obesity: Establish reasonable short term and long term weight goals.    Admit Weight 178 lb 8 oz (81 kg)    Goal Weight: Short Term 173 lb (78.5 kg)    Goal Weight: Long Term 173 lb (78.5 kg)    Expected Outcomes Short Term: Continue to assess and modify interventions until short term weight is achieved;Long Term: Adherence to nutrition and physical activity/exercise program aimed toward attainment of established weight goal;Understanding recommendations for meals to include 15-35% energy as protein, 25-35% energy from fat, 35-60% energy from carbohydrates, less than 200mg  of dietary cholesterol, 20-35 gm of total fiber daily;Understanding of distribution of calorie intake throughout the day with the consumption of  4-5 meals/snacks;Weight Maintenance: Understanding of the daily nutrition guidelines, which includes 25-35% calories from fat, 7% or less cal from saturated fats, less than 200mg  cholesterol, less than 1.5gm of sodium, & 5 or more servings of fruits and vegetables daily    Hypertension Yes    Intervention Provide education on lifestyle modifcations including regular physical activity/exercise, weight management, moderate sodium restriction and increased consumption of fresh fruit, vegetables, and low fat dairy, alcohol moderation, and smoking cessation.;Monitor prescription use compliance.    Expected Outcomes Short Term: Continued assessment and intervention until BP is < 140/85mm HG in hypertensive participants. < 130/68mm HG in hypertensive participants with diabetes, heart failure or chronic kidney disease.;Long Term: Maintenance of blood pressure at goal levels.    Lipids Yes    Intervention Provide education and support for participant on nutrition & aerobic/resistive exercise along with prescribed medications to achieve LDL 70mg , HDL >40mg .    Expected Outcomes Short Term: Participant states understanding of desired cholesterol values and is compliant with medications prescribed. Participant is following exercise prescription and nutrition guidelines.;Long Term: Cholesterol controlled with medications as prescribed, with individualized exercise RX and with personalized nutrition plan. Value goals: LDL < 70mg , HDL > 40 mg.          Education:Diabetes - Individual verbal and written instruction to review signs/symptoms of diabetes, desired ranges of glucose level fasting, after meals and with exercise. Acknowledge that pre and post exercise glucose checks will be done for 3 sessions at entry of program.   Know Your Numbers and Heart Failure: - Group verbal and visual instruction to discuss disease risk factors for cardiac and pulmonary disease and treatment options.  Reviews associated  critical values for Overweight/Obesity, Hypertension, Cholesterol, and Diabetes.  Discusses basics of heart failure: signs/symptoms and treatments.  Introduces Heart Failure Zone chart for action plan for heart failure. Written material provided at class time.   Core Components/Risk Factors/Patient Goals Review:    Core Components/Risk Factors/Patient Goals at Discharge (Final Review):    ITP Comments:  ITP Comments     Row Name 03/15/24 1359 03/22/24 1257 03/28/24 1108 04/12/24 0933     ITP Comments Initial phone call completed. Diagnosis can be found in [facility] 9/15. EP Orientation scheduled for Wednesday 11/26 at 10:30am. Completed and gym orientation for respiratory care services. Initial ITP created and sent for review to Dr. Fuad Aleskerov, Medical Director. First full day of exercise!  Patient was oriented to gym and equipment including functions, settings, policies, and procedures.  Patient's individual exercise prescription and treatment plan were  reviewed.  All starting workloads were established based on the results of the 6 minute walk test done at initial orientation visit.  The plan for exercise progression was also introduced and progression will be customized based on patient's performance and goals. 30 Day review completed. Medical Director ITP review done, changes made as directed, and signed approval by Medical Director. New to program.       Comments: 30 day review      [1]  Current Outpatient Medications:    albuterol  (VENTOLIN  HFA) 108 (90 Base) MCG/ACT inhaler, Inhale 2 puffs into the lungs every 6 (six) hours as needed for wheezing or shortness of breath., Disp: 8 g, Rfl: 5   amLODipine  (NORVASC ) 2.5 MG tablet, Take 1 tablet (2.5 mg total) by mouth at bedtime., Disp: 90 tablet, Rfl: 0   aspirin  EC 81 MG EC tablet, Take 1 tablet (81 mg total) by mouth daily. Swallow whole., Disp: 30 tablet, Rfl: 11   atorvastatin  (LIPITOR) 40 MG tablet, Take 1 tablet (40 mg total)  by mouth at bedtime., Disp: 90 tablet, Rfl: 3   clobetasol  (TEMOVATE ) 0.05 % external solution, Apply 1 Application topically 2 (two) times daily., Disp: 50 mL, Rfl: 0   clobetasol  ointment (TEMOVATE ) 0.05 %, Apply 1 Application topically 2 (two) times daily as needed (Skin problems)., Disp: , Rfl:    fluticasone  (FLONASE ) 50 MCG/ACT nasal spray, Place 2 sprays into both nostrils daily., Disp: , Rfl: 2   ipratropium (ATROVENT) 0.03 % nasal spray, Place 2 sprays into the nose., Disp: , Rfl:    sildenafil (REVATIO) 20 MG tablet, Take 20 mg by mouth daily as needed (ED)., Disp: , Rfl:    TRELEGY ELLIPTA  100-62.5-25 MCG/ACT AEPB, Inhale 1 puff into the lungs daily., Disp: 60 each, Rfl: 6   TRELEGY ELLIPTA  200-62.5-25 MCG/ACT AEPB, Inhale 1 puff into the lungs daily., Disp: , Rfl:  [2]  Social History Tobacco Use  Smoking Status Former   Current packs/day: 0.00   Average packs/day: 1 pack/day for 50.0 years (50.0 ttl pk-yrs)   Types: Cigarettes   Start date: 02/09/1971   Quit date: 02/08/2021   Years since quitting: 3.1  Smokeless Tobacco Never

## 2024-04-13 ENCOUNTER — Encounter

## 2024-04-13 DIAGNOSIS — J432 Centrilobular emphysema: Secondary | ICD-10-CM

## 2024-04-13 NOTE — Progress Notes (Signed)
 Daily Session Note  Patient Details  Name: Jordan Barber MRN: [id number] Date of Birth: 04/30/1945 Referring Provider:   Conrad Ports Pulmonary Rehab from 03/22/2024 in [facility] Cardiac and Pulmonary Rehab  Referring Provider Parris Manna, MD    Encounter Date: 04/13/2024  Check In:  Session Check In - 04/13/24 1116       Check-In   Supervising physician immediately available to respond to emergencies See telemetry face sheet for immediately available ER MD    Location [facility]-Cardiac & Pulmonary Rehab    Staff Present Leita Franks RN,BSN;Joseph [facility] BS, Exercise Physiologist;Margaret Best, MS, Exercise Physiologist    Virtual Visit No    Medication changes reported     No    Fall or balance concerns reported    No    Warm-up and Cool-down Not performed (comment)    Resistance Training Performed Yes    VAD Patient? No    PAD/SET Patient? No      Pain Assessment   Currently in Pain? No/denies             Tobacco Use History[1]  Goals Met:  Proper associated with RPD/PD & O2 Sat Independence with exercise equipment Using PLB without cueing & demonstrates good technique Exercise tolerated well No report of concerns or symptoms today Strength training completed today  Goals Unmet:  Not Applicable  Comments: Pt able to follow exercise prescription today without complaint.  Will continue to monitor for progression.    Dr. Oneil Pinal is Medical Director for [facility] Cardiac Rehabilitation.  Dr. Fuad Aleskerov is Medical Director for [facility] Pulmonary Rehabilitation.    [1]  Social History Tobacco Use  Smoking Status Former   Current packs/day: 0.00   Average packs/day: 1 pack/day for 50.0 years (50.0 ttl pk-yrs)   Types: Cigarettes   Start date: 02/09/1971   Quit date: 02/08/2021   Years since quitting: 3.1  Smokeless Tobacco Never

## 2024-04-14 ENCOUNTER — Encounter

## 2024-04-14 DIAGNOSIS — J432 Centrilobular emphysema: Secondary | ICD-10-CM | POA: Diagnosis not present

## 2024-04-14 NOTE — Progress Notes (Signed)
 Daily Session Note  Patient Details  Name: Jordan Barber MRN: [id number] Date of Birth: 29-Sep-1945 Referring Provider:   Flowsheet Row Pulmonary Rehab from 03/22/2024 in [facility] Cardiac and Pulmonary Rehab  Referring Provider Parris Manna, MD    Encounter Date: 04/14/2024  Check In:  Session Check In - 04/14/24 1206       Check-In   Supervising physician immediately available to respond to emergencies See telemetry face sheet for immediately available ER MD    Location [facility]-Cardiac & Pulmonary Rehab    Staff Present Maxon Conetta BS, Exercise Physiologist;Joseph Rolinda NORWOOD HARMAN Tsosie Peggi, RN, DNP, NE-BC;Meredith Tressa RN,BSN    Virtual Visit No    Medication changes reported     No    Fall or balance concerns reported    No    Warm-up and Cool-down Performed on first and last piece of equipment    Resistance Training Performed Yes    VAD Patient? No    PAD/SET Patient? No      Pain Assessment   Currently in Pain? No/denies             Tobacco Use History[1]  Goals Met:  Proper associated with RPD/PD & O2 Sat Independence with exercise equipment Using PLB without cueing & demonstrates good technique Exercise tolerated well No report of concerns or symptoms today Strength training completed today  Goals Unmet:  Not Applicable  Comments: Pt able to follow exercise prescription today without complaint.  Will continue to monitor for progression.    Dr. Oneil Pinal is Medical Director for [facility] Cardiac Rehabilitation.  Dr. Fuad Aleskerov is Medical Director for [facility] Pulmonary Rehabilitation.    [1]  Social History Tobacco Use  Smoking Status Former   Current packs/day: 0.00   Average packs/day: 1 pack/day for 50.0 years (50.0 ttl pk-yrs)   Types: Cigarettes   Start date: 02/09/1971   Quit date: 02/08/2021   Years since quitting: 3.1  Smokeless Tobacco Never

## 2024-04-18 ENCOUNTER — Encounter

## 2024-04-18 DIAGNOSIS — J432 Centrilobular emphysema: Secondary | ICD-10-CM

## 2024-04-18 NOTE — Progress Notes (Signed)
 Daily Session Note  Patient Details  Name: Jordan Barber MRN: [id number] Date of Birth: 1945/06/30 Referring Provider:   Flowsheet Row Pulmonary Rehab from 03/22/2024 in [facility] Cardiac and Pulmonary Rehab  Referring Provider Parris Manna, MD    Encounter Date: 04/18/2024  Check In:  Session Check In - 04/18/24 1105       Check-In   Supervising physician immediately available to respond to emergencies See telemetry face sheet for immediately available ER MD    Location [facility]-Cardiac & Pulmonary Rehab    Staff Present Burnard Davenport RN,BSN,MPA;Laura Cates RN,BSN;Kristen Coble RN,BC,MSN;Jason Elnor [facility]    Virtual Visit No    Medication changes reported     No    Fall or balance concerns reported    No    Warm-up and Cool-down Performed on first and last piece of equipment    Resistance Training Performed Yes    VAD Patient? No    PAD/SET Patient? No      Pain Assessment   Currently in Pain? No/denies             Tobacco Use History[1]  Goals Met:  Proper associated with RPD/PD & O2 Sat Independence with exercise equipment Using PLB without cueing & demonstrates good technique Exercise tolerated well No report of concerns or symptoms today Strength training completed today  Goals Unmet:  Not Applicable  Comments: Pt able to follow exercise prescription today without complaint.  Will continue to monitor for progression.    Dr. Oneil Pinal is Medical Director for [facility] Cardiac Rehabilitation.  Dr. Fuad Aleskerov is Medical Director for [facility] Pulmonary Rehabilitation.    [1]  Social History Tobacco Use  Smoking Status Former   Current packs/day: 0.00   Average packs/day: 1 pack/day for 50.0 years (50.0 ttl pk-yrs)   Types: Cigarettes   Start date: 02/09/1971   Quit date: 02/08/2021   Years since quitting: 3.1  Smokeless Tobacco Never

## 2024-04-19 ENCOUNTER — Encounter

## 2024-04-19 DIAGNOSIS — J432 Centrilobular emphysema: Secondary | ICD-10-CM | POA: Diagnosis not present

## 2024-04-19 NOTE — Progress Notes (Signed)
 Daily Session Note  Patient Details  Name: Jordan Barber MRN: [id number] Date of Birth: 09/11/1945 Referring Provider:   Flowsheet Row Pulmonary Rehab from 03/22/2024 in [facility] Cardiac and Pulmonary Rehab  Referring Provider Parris Manna, MD    Encounter Date: 04/19/2024  Check In:  Session Check In - 04/19/24 1102       Check-In   Supervising physician immediately available to respond to emergencies See telemetry face sheet for immediately available ER MD    Location [facility]-Cardiac & Pulmonary Rehab    Staff Present Burnard Davenport RN,BSN,MPA;Laura Cates RN,BSN;Joseph Rolinda NORWO[facility] HARMAN Verlie Laird, [place], Exercise Physiologist    Virtual Visit No    Medication changes reported     No    Fall or balance concerns reported    No    Warm-up and Cool-down Performed on first and last piece of equipment    Resistance Training Performed Yes    VAD Patient? No    PAD/SET Patient? No      Pain Assessment   Currently in Pain? No/denies             Tobacco Use History[1]  Goals Met:  Proper associated with RPD/PD & O2 Sat Independence with exercise equipment Using PLB without cueing & demonstrates good technique Exercise tolerated well No report of concerns or symptoms today Strength training completed today  Goals Unmet:  Not Applicable  Comments: Pt able to follow exercise prescription today without complaint.  Will continue to monitor for progression.    Dr. Oneil Pinal is Medical Director for [facility] Cardiac Rehabilitation.  Dr. Fuad Aleskerov is Medical Director for [facility] Pulmonary Rehabilitation.    [1]  Social History Tobacco Use  Smoking Status Former   Current packs/day: 0.00   Average packs/day: 1 pack/day for 50.0 years (50.0 ttl pk-yrs)   Types: Cigarettes   Start date: 02/09/1971   Quit date: 02/08/2021   Years since quitting: 3.1  Smokeless Tobacco Never

## 2024-04-25 ENCOUNTER — Encounter

## 2024-04-25 DIAGNOSIS — J432 Centrilobular emphysema: Secondary | ICD-10-CM | POA: Diagnosis not present

## 2024-04-25 NOTE — Progress Notes (Signed)
 Daily Session Note  Patient Details  Name: Jordan Barber MRN: [id number] Date of Birth: 08-Feb-1946 Referring Provider:   Flowsheet Row Pulmonary Rehab from 03/22/2024 in [facility] Cardiac and Pulmonary Rehab  Referring Provider Parris Manna, MD    Encounter Date: 04/25/2024  Check In:  Session Check In - 04/25/24 1121       Check-In   Supervising physician immediately available to respond to emergencies See telemetry face sheet for immediately available ER MD    Location [facility]-Cardiac & Pulmonary Rehab    Staff Present Burnard Davenport RN,BSN,MPA;Meredith Tressa RN,BSN;Maxon Conetta BS, Exercise Physiologist;Jason Elnor [facility]    Virtual Visit No    Medication changes reported     No    Fall or balance concerns reported    No    Warm-up and Cool-down Performed on first and last piece of equipment    Resistance Training Performed Yes    VAD Patient? No    PAD/SET Patient? No      Pain Assessment   Currently in Pain? No/denies             Tobacco Use History[1]  Goals Met:  Proper associated with RPD/PD & O2 Sat Independence with exercise equipment Using PLB without cueing & demonstrates good technique Exercise tolerated well No report of concerns or symptoms today Strength training completed today  Goals Unmet:  Not Applicable  Comments: Pt able to follow exercise prescription today without complaint.  Will continue to monitor for progression.    Dr. Oneil Pinal is Medical Director for [facility] Cardiac Rehabilitation.  Dr. Fuad Aleskerov is Medical Director for [facility] Pulmonary Rehabilitation.    [1]  Social History Tobacco Use  Smoking Status Former   Current packs/day: 0.00   Average packs/day: 1 pack/day for 50.0 years (50.0 ttl pk-yrs)   Types: Cigarettes   Start date: 02/09/1971   Quit date: 02/08/2021   Years since quitting: 3.2  Smokeless Tobacco Never

## 2024-04-27 ENCOUNTER — Encounter

## 2024-04-28 ENCOUNTER — Encounter: Attending: Pulmonary Disease | Admitting: Emergency Medicine

## 2024-04-28 DIAGNOSIS — J432 Centrilobular emphysema: Secondary | ICD-10-CM | POA: Diagnosis present

## 2024-04-28 NOTE — Progress Notes (Signed)
 Daily Session Note  Patient Details  Name: Jordan Barber MRN: [id number] Date of Birth: 06/04/1945 Referring Provider:   Flowsheet Row Pulmonary Rehab from 03/22/2024 in [facility] Cardiac and Pulmonary Rehab  Referring Provider Parris Manna, MD    Encounter Date: 04/28/2024  Check In:  Session Check In - 04/28/24 1111       Check-In   Supervising physician immediately available to respond to emergencies See telemetry face sheet for immediately available ER MD    Location [facility]-Cardiac & Pulmonary Rehab    Staff Present Leita Franks RN,BSN;[facility] BS, Exercise Physiologist;Mary Idell Glen, RN, BSN, MA    Virtual Visit No    Medication changes reported     No    Fall or balance concerns reported    No    Warm-up and Cool-down Performed on first and last piece of equipment    Resistance Training Performed Yes    VAD Patient? No    PAD/SET Patient? No      Pain Assessment   Currently in Pain? No/denies             Tobacco Use History[1]  Goals Met:  Proper associated with RPD/PD & O2 Sat Independence with exercise equipment Using PLB without cueing & demonstrates good technique Exercise tolerated well No report of concerns or symptoms today Strength training completed today  Goals Unmet:  Not Applicable  Comments: Pt able to follow exercise prescription today without complaint.  Will continue to monitor for progression.    Dr. Oneil Pinal is Medical Director for [facility] Cardiac Rehabilitation.  Dr. Fuad Aleskerov is Medical Director for [facility] Pulmonary Rehabilitation.    [1]  Social History Tobacco Use  Smoking Status Former   Current packs/day: 0.00   Average packs/day: 1 pack/day for 50.0 years (50.0 ttl pk-yrs)   Types: Cigarettes   Start date: 02/09/1971   Quit date: 02/08/2021   Years since quitting: 3.2  Smokeless Tobacco Never

## 2024-05-02 ENCOUNTER — Encounter

## 2024-05-02 DIAGNOSIS — J432 Centrilobular emphysema: Secondary | ICD-10-CM | POA: Diagnosis not present

## 2024-05-02 NOTE — Progress Notes (Signed)
 Daily Session Note  Patient Details  Name: Jordan Barber MRN: [id number] Date of Birth: May 05, 1945 Referring Provider:   Flowsheet Row Pulmonary Rehab from 03/22/2024 in [facility] Cardiac and Pulmonary Rehab  Referring Provider Parris Manna, MD    Encounter Date: 05/02/2024  Check In:  Session Check In - 05/02/24 1126       Check-In   Supervising physician immediately available to respond to emergencies See telemetry face sheet for immediately available ER MD    Location [facility]-Cardiac & Pulmonary Rehab    Staff Present Burnard Davenport RN,BSN,MPA;Meredith Tressa RN,BSN;Maxon Burnell BS, Exercise Physiologist;Noah Tickle, BS, Exercise Physiologist;Jason Elnor RDN,LDN    Virtual Visit No    Medication changes reported     No    Fall or balance concerns reported    No    Warm-up and Cool-down Performed on first and last piece of equipment    Resistance Training Performed Yes    VAD Patient? No    PAD/SET Patient? No      Pain Assessment   Currently in Pain? No/denies             Tobacco Use History[1]  Goals Met:  Proper associated with RPD/PD & O2 Sat Independence with exercise equipment Using PLB without cueing & demonstrates good technique Exercise tolerated well No report of concerns or symptoms today Strength training completed today  Goals Unmet:  Not Applicable  Comments: Pt able to follow exercise prescription today without complaint.  Will continue to monitor for progression.    Dr. Oneil Pinal is Medical Director for [facility] Cardiac Rehabilitation.  Dr. Fuad Aleskerov is Medical Director for [facility] Pulmonary Rehabilitation.    [1]  Social History Tobacco Use  Smoking Status Former   Current packs/day: 0.00   Average packs/day: 1 pack/day for 50.0 years (50.0 ttl pk-yrs)   Types: Cigarettes   Start date: 02/09/1971   Quit date: 02/08/2021   Years since quitting: 3.2  Smokeless Tobacco Never

## 2024-05-04 ENCOUNTER — Encounter: Admitting: Emergency Medicine

## 2024-05-04 DIAGNOSIS — J432 Centrilobular emphysema: Secondary | ICD-10-CM

## 2024-05-04 NOTE — Progress Notes (Signed)
 Daily Session Note  Patient Details  Name: Jordan Barber MRN: [id number] Date of Birth: 16-Feb-1946 Referring Provider:   Flowsheet Row Pulmonary Rehab from 03/22/2024 in [facility] Cardiac and Pulmonary Rehab  Referring Provider Parris Manna, MD    Encounter Date: 05/04/2024  Check In:  Session Check In - 05/04/24 1126       Check-In   Supervising physician immediately available to respond to emergencies See telemetry face sheet for immediately available ER MD    Location [facility]-Cardiac & Pulmonary Rehab    Staff Present Leita Franks RN,BSN;[facility] RCP,RRT,BSRT;Noah Tickle, [place], Exercise Physiologist;Jason Elnor RDN,LDN    Virtual Visit No    Medication changes reported     No    Fall or balance concerns reported    No    Warm-up and Cool-down Performed on first and last piece of equipment    Resistance Training Performed Yes    VAD Patient? No    PAD/SET Patient? No      Pain Assessment   Currently in Pain? No/denies             Tobacco Use History[1]  Goals Met:  Proper associated with RPD/PD & O2 Sat Independence with exercise equipment Using PLB without cueing & demonstrates good technique Exercise tolerated well No report of concerns or symptoms today Strength training completed today  Goals Unmet:  Not Applicable  Comments: Pt able to follow exercise prescription today without complaint.  Will continue to monitor for progression.    Dr. Oneil Pinal is Medical Director for [facility] Cardiac Rehabilitation.  Dr. Fuad Aleskerov is Medical Director for [facility] Pulmonary Rehabilitation.    [1]  Social History Tobacco Use  Smoking Status Former   Current packs/day: 0.00   Average packs/day: 1 pack/day for 50.0 years (50.0 ttl pk-yrs)   Types: Cigarettes   Start date: 02/09/1971   Quit date: 02/08/2021   Years since quitting: 3.2  Smokeless Tobacco Never

## 2024-05-05 ENCOUNTER — Encounter: Admitting: Emergency Medicine

## 2024-05-05 ENCOUNTER — Other Ambulatory Visit: Payer: Self-pay | Admitting: Family Medicine

## 2024-05-05 DIAGNOSIS — J432 Centrilobular emphysema: Secondary | ICD-10-CM | POA: Diagnosis not present

## 2024-05-05 DIAGNOSIS — I10 Essential (primary) hypertension: Secondary | ICD-10-CM

## 2024-05-05 NOTE — Progress Notes (Signed)
 Daily Session Note  Patient Details  Name: Jordan Barber MRN: [id number] Date of Birth: Dec 01, 1945 Referring Provider:   Flowsheet Row Pulmonary Rehab from 03/22/2024 in [facility] Cardiac and Pulmonary Rehab  Referring Provider Parris Manna, MD    Encounter Date: 05/05/2024  Check In:  Session Check In - 05/05/24 1052       Check-In   Supervising physician immediately available to respond to emergencies See telemetry face sheet for immediately available ER MD    Location [facility]-Cardiac & Pulmonary Rehab    Staff Present Fairy Plater RCP,RRT,BSRT;Maxon Conetta BS, Exercise Physiologist;Adrienne Trombetta RN,BSN;Noah Tickle, BS, Exercise Physiologist    Virtual Visit No    Medication changes reported     No    Fall or balance concerns reported    No    Warm-up and Cool-down Performed on first and last piece of equipment    Resistance Training Performed Yes    VAD Patient? No    PAD/SET Patient? No      Pain Assessment   Currently in Pain? No/denies             Tobacco Use History[1]  Goals Met:  Proper associated with RPD/PD & O2 Sat Independence with exercise equipment Using PLB without cueing & demonstrates good technique Exercise tolerated well No report of concerns or symptoms today Strength training completed today  Goals Unmet:  Not Applicable  Comments: Pt able to follow exercise prescription today without complaint.  Will continue to monitor for progression.    Dr. Oneil Pinal is Medical Director for [facility] Cardiac Rehabilitation.  Dr. Fuad Aleskerov is Medical Director for [facility] Pulmonary Rehabilitation.    [1]  Social History Tobacco Use  Smoking Status Former   Current packs/day: 0.00   Average packs/day: 1 pack/day for 50.0 years (50.0 ttl pk-yrs)   Types: Cigarettes   Start date: 02/09/1971   Quit date: 02/08/2021   Years since quitting: 3.2  Smokeless Tobacco Never

## 2024-05-09 ENCOUNTER — Encounter

## 2024-05-09 DIAGNOSIS — J432 Centrilobular emphysema: Secondary | ICD-10-CM | POA: Diagnosis not present

## 2024-05-09 NOTE — Progress Notes (Signed)
 Daily Session Note  Patient Details  Name: Jordan Barber MRN: [id number] Date of Birth: 1946/01/13 Referring Provider:   Flowsheet Row Pulmonary Rehab from 03/22/2024 in [facility] Cardiac and Pulmonary Rehab  Referring Provider Parris Manna, MD    Encounter Date: 05/09/2024  Check In:  Session Check In - 05/09/24 1100       Check-In   Supervising physician immediately available to respond to emergencies See telemetry face sheet for immediately available ER MD    Location [facility]-Cardiac & Pulmonary Rehab    Staff Present Burnard Davenport RN,BSN,MPA;Meredith Tressa RN,BSN;Maxon Burnell BS, Exercise Physiologist;Margaret Best, MS, Exercise Physiologist;Jason Elnor RDN,LDN    Virtual Visit No    Medication changes reported     No    Fall or balance concerns reported    No    Warm-up and Cool-down Performed on first and last piece of equipment    Resistance Training Performed Yes    VAD Patient? No    PAD/SET Patient? No      Pain Assessment   Currently in Pain? No/denies             Tobacco Use History[1]  Goals Met:  Proper associated with RPD/PD & O2 Sat Independence with exercise equipment Using PLB without cueing & demonstrates good technique Exercise tolerated well No report of concerns or symptoms today Strength training completed today  Goals Unmet:  Not Applicable  Comments: Pt able to follow exercise prescription today without complaint.  Will continue to monitor for progression.    Dr. Oneil Pinal is Medical Director for [facility] Cardiac Rehabilitation.  Dr. Fuad Aleskerov is Medical Director for [facility] Pulmonary Rehabilitation.    [1]  Social History Tobacco Use  Smoking Status Former   Current packs/day: 0.00   Average packs/day: 1 pack/day for 50.0 years (50.0 ttl pk-yrs)   Types: Cigarettes   Start date: 02/09/1971   Quit date: 02/08/2021   Years since quitting: 3.2  Smokeless Tobacco Never

## 2024-05-10 ENCOUNTER — Encounter: Payer: Self-pay | Admitting: *Deleted

## 2024-05-10 DIAGNOSIS — J432 Centrilobular emphysema: Secondary | ICD-10-CM

## 2024-05-10 NOTE — Progress Notes (Signed)
 Pulmonary Individual Treatment Plan  Patient Details  Name: Jordan Barber MRN: [id number] Date of Birth: Aug 15, 1945 Referring Provider:   Conrad Ports Pulmonary Rehab from 03/22/2024 in [facility] Cardiac and Pulmonary Rehab  Referring Provider Parris Manna, MD    Initial Encounter Date:  Flowsheet Row Pulmonary Rehab from 03/22/2024 in [facility] Cardiac and Pulmonary Rehab  Date 03/22/24    Visit Diagnosis: Centrilobular emphysema (HCC)  Patient's Home Medications on Admission: Current Medications[1]  Past Medical History: Past Medical History:  Diagnosis Date   Acute blood loss anemia (ABLA)    a.) following hip surgery 01/2021   Acute respiratory failure with hypoxia (HCC) 02/10/2021   a.) following hip surgery in 01/2021   Aortic atherosclerosis    Aortic atherosclerosis    Bilateral carotid artery disease    Bilateral renal cysts    CAD (coronary artery disease)    Cerebral microvascular disease    Cervical spinal stenosis    Cervical spondylosis    COPD (chronic obstructive pulmonary disease) (HCC)    Diastolic dysfunction    Dyspnea    Emphysema of lung (HCC)    Erectile dysfunction    a.) on PDE5i (sildenafil)   History of acute respiratory failure    HLD (hyperlipidemia)    Hypertension    Lacunar cerebrovascular accident (CVA) (HCC)    a.) noted on brain MRI 04/19/2021; LEFT thalamocapsular junction   LAFB (left anterior fascicular block)    Left inguinal hernia    Long-term use of aspirin  therapy    New onset left bundle branch block (LBBB) 11/30/2023   a.) noted on preoperative ECG on 11/30/2023   Prostate cancer [facility])    a.) s/p radical prostatectomy 2011   Pulmonary hypertension (HCC)    Pulmonary nodules    Ramsay Hunt syndrome (geniculate herpes zoster)    Supraumbilical hernia    TIA (transient ischemic attack) 03/2021   Tobacco abuse     Tobacco Use: Tobacco Use History[2]  Labs: Review Flowsheet       Latest Ref Rng & Units 04/19/2021   Labs for ITP Cardiac and Pulmonary Rehab  Cholestrol 0 - 200 mg/dL 809   LDL (calc) 0 - 99 mg/dL 882   HDL-C >59 mg/dL 60   Trlycerides <849 mg/dL 65   Hemoglobin J8r 4.8 - 5.6 % 5.4      Pulmonary Assessment Scores:  Pulmonary Assessment Scores     Row Name 03/22/24 1310         ADL UCSD   ADL Phase Entry     SOB Score total 32     Rest 0     Walk 1     Stairs 2     Bath 1     Dress 1     Shop 2       CAT Score   CAT Score 14       mMRC Score   mMRC Score 0        UCSD: Self-administered rating of dyspnea associated with activities of daily living (ADLs) 6-point scale (0 = not at all to 5 = maximal or unable to do because of breathlessness)  Scoring Scores Prabhakar from 0 to 120.  Minimally important difference is 5 units  CAT: CAT can identify the health impairment of COPD patients and is better correlated with disease progression.  CAT has a scoring Soltis of zero to 40. The CAT score is classified into four groups of low (less than 10), medium (10 -  20), high (21-30) and very high (31-40) based on the impact level of disease on health status. A CAT score over 10 suggests significant symptoms.  A worsening CAT score could be explained by an exacerbation, poor medication adherence, poor inhaler technique, or progression of COPD or comorbid conditions.  CAT MCID is 2 points  mMRC: mMRC (Modified Medical Research Council) Dyspnea Scale is used to assess the degree of baseline functional disability in patients of respiratory disease due to dyspnea. No minimal important difference is established. A decrease in score of 1 point or greater is considered a positive change.   Pulmonary Function Assessment:   Exercise Target Goals: Exercise Program Goal: Individual exercise prescription set using results from initial 6 min walk test and THRR while considering  patients activity barriers and safety.   Exercise Prescription Goal: Initial exercise prescription builds  to 30-45 minutes a day of aerobic activity, 2-3 days per week.  Home exercise guidelines will be given to patient during program as part of exercise prescription that the participant will acknowledge.  Education: Aerobic Exercise: - Group verbal and visual presentation on the components of exercise prescription. Introduces F.I.T.T principle from ACSM for exercise prescriptions.  Reviews F.I.T.T. principles of aerobic exercise including progression. Written material provided at class time.   Education: Resistance Exercise: - Group verbal and visual presentation on the components of exercise prescription. Introduces F.I.T.T principle from ACSM for exercise prescriptions  Reviews F.I.T.T. principles of resistance exercise including progression. Written material provided at class time.    Education: Exercise & Equipment Safety: - Individual verbal instruction and demonstration of equipment use and safety with use of the equipment. Flowsheet Row Pulmonary Rehab from 04/06/2024 in [facility] Cardiac and Pulmonary Rehab  Date 03/22/24  Educator MB  Instruction Review Code 1- Verbalizes Understanding    Education: Exercise Physiology & General Exercise Guidelines: - Group verbal and written instruction with models to review the exercise physiology of the cardiovascular system and associated critical values. Provides general exercise guidelines with specific guidelines to those with heart or lung disease.    Education: Flexibility, Balance, Mind/Body Relaxation: - Group verbal and visual presentation with interactive activity on the components of exercise prescription. Introduces F.I.T.T principle from ACSM for exercise prescriptions. Reviews F.I.T.T. principles of flexibility and balance exercise training including progression. Also discusses the mind body connection.  Reviews various relaxation techniques to help reduce and manage stress (i.e. Deep breathing, progressive muscle relaxation, and  visualization). Balance handout provided to take home. Written material provided at class time.   Activity Barriers & Risk Stratification:  Activity Barriers & Cardiac Risk Stratification - 03/22/24 1302       Activity Barriers & Cardiac Risk Stratification   Activity Barriers None          6 Minute Walk:  6 Minute Walk     Row Name 03/22/24 1257         6 Minute Walk   Phase Initial     Distance 1165 feet     Walk Time 6 minutes     # of Rest Breaks 0     MPH 2.21     METS 2.61     RPE 9     Perceived Dyspnea  1     VO2 Peak 9.14     Symptoms No     Resting HR 75 bpm     Resting BP 148/82     Resting Oxygen Saturation  91 %     Exercise Oxygen  Saturation  during 6 min walk 87 %     Max Ex. HR 96 bpm     Max Ex. BP 146/84     2 Minute Post BP 136/80       Interval HR   1 Minute HR 90     2 Minute HR 93     3 Minute HR 96     4 Minute HR 96     5 Minute HR 91     6 Minute HR 92     2 Minute Post HR 73     Interval Heart Rate? Yes       Interval Oxygen   Interval Oxygen? Yes     Baseline Oxygen Saturation % 91 %     1 Minute Oxygen Saturation % 90 %     1 Minute Liters of Oxygen 4 L     2 Minute Oxygen Saturation % 88 %     2 Minute Liters of Oxygen 4 L     3 Minute Oxygen Saturation % 87 %     3 Minute Liters of Oxygen 4 L     4 Minute Oxygen Saturation % 87 %     4 Minute Liters of Oxygen 4 L     5 Minute Oxygen Saturation % 87 %     5 Minute Liters of Oxygen 4 L     6 Minute Oxygen Saturation % 87 %     6 Minute Liters of Oxygen 4 L     2 Minute Post Oxygen Saturation % 96 %     2 Minute Post Liters of Oxygen 73 L       Oxygen Initial Assessment:  Oxygen Initial Assessment - 03/15/24 1338       Home Oxygen   Home Oxygen Device E-Tanks;Portable Concentrator    Home Exercise Oxygen Prescription Continuous    Liters per minute 4    Home Resting Oxygen Prescription Continuous    Liters per minute 3    Compliance with Home Oxygen Use Yes       Intervention   Short Term Goals To learn and understand importance of monitoring SPO2 with pulse oximeter and demonstrate accurate use of the pulse oximeter.;To learn and understand importance of maintaining oxygen saturations>88%;To learn and demonstrate proper pursed lip breathing techniques or other breathing techniques. ;To learn and demonstrate proper use of respiratory medications;To learn and exhibit compliance with exercise, home and travel O2 prescription    Long  Term Goals Verbalizes importance of monitoring SPO2 with pulse oximeter and return demonstration;Maintenance of O2 saturations>88%;Exhibits proper breathing techniques, such as pursed lip breathing or other method taught during program session;Compliance with respiratory medication;Demonstrates proper use of MDIs;Exhibits compliance with exercise, home  and travel O2 prescription          Oxygen Re-Evaluation:  Oxygen Re-Evaluation     Row Name 03/28/24 1109             Home Oxygen   Home Oxygen Device E-Tanks;Portable Concentrator       Home Exercise Oxygen Prescription Continuous       Liters per minute 4       Home Resting Oxygen Prescription Continuous       Liters per minute 3       Compliance with Home Oxygen Use Yes         Goals/Expected Outcomes   Short Term Goals To learn and understand importance of monitoring SPO2 with pulse oximeter  and demonstrate accurate use of the pulse oximeter.;To learn and understand importance of maintaining oxygen saturations>88%;To learn and demonstrate proper pursed lip breathing techniques or other breathing techniques. ;To learn and demonstrate proper use of respiratory medications;To learn and exhibit compliance with exercise, home and travel O2 prescription       Long  Term Goals Verbalizes importance of monitoring SPO2 with pulse oximeter and return demonstration;Maintenance of O2 saturations>88%;Exhibits proper breathing techniques, such as pursed lip breathing or other  method taught during program session;Compliance with respiratory medication;Demonstrates proper use of MDIs;Exhibits compliance with exercise, home  and travel O2 prescription       Comments Reviewed PLB technique with pt.  Talked about how it works and it's importance in maintaining their exercise saturations.       Goals/Expected Outcomes Short: Become more profiecient at using PLB. Long: Become independent at using PLB.          Oxygen Discharge (Final Oxygen Re-Evaluation):  Oxygen Re-Evaluation - 03/28/24 1109       Home Oxygen   Home Oxygen Device E-Tanks;Portable Concentrator    Home Exercise Oxygen Prescription Continuous    Liters per minute 4    Home Resting Oxygen Prescription Continuous    Liters per minute 3    Compliance with Home Oxygen Use Yes      Goals/Expected Outcomes   Short Term Goals To learn and understand importance of monitoring SPO2 with pulse oximeter and demonstrate accurate use of the pulse oximeter.;To learn and understand importance of maintaining oxygen saturations>88%;To learn and demonstrate proper pursed lip breathing techniques or other breathing techniques. ;To learn and demonstrate proper use of respiratory medications;To learn and exhibit compliance with exercise, home and travel O2 prescription    Long  Term Goals Verbalizes importance of monitoring SPO2 with pulse oximeter and return demonstration;Maintenance of O2 saturations>88%;Exhibits proper breathing techniques, such as pursed lip breathing or other method taught during program session;Compliance with respiratory medication;Demonstrates proper use of MDIs;Exhibits compliance with exercise, home  and travel O2 prescription    Comments Reviewed PLB technique with pt.  Talked about how it works and it's importance in maintaining their exercise saturations.    Goals/Expected Outcomes Short: Become more profiecient at using PLB. Long: Become independent at using PLB.          Initial Exercise  Prescription:  Initial Exercise Prescription - 03/22/24 1300       Date of Initial Exercise RX and Referring Provider   Date 03/22/24    Referring Provider Aleskerov, Fuad, MD      Oxygen   Oxygen Continuous    Liters 4    Maintain Oxygen Saturation 88% or higher      Treadmill   MPH 2.2    Grade 0    Minutes 15    METs 2.68      NuStep   Level 2    SPM 80    Minutes 15    METs 2.61      REL-XR   Level 1    Speed 50    Minutes 15    METs 2.61      T5 Nustep   Level 2    SPM 80    Minutes 15    METs 2.61      Rower   Level 2    Watts 20    Minutes 15    METs 2.61      Prescription Details   Frequency (times per week) 3  Duration Progress to 30 minutes of continuous aerobic without signs/symptoms of physical distress      Intensity   THRR 40-80% of Max Heartrate 100-129    Ratings of Perceived Exertion 11-13    Perceived Dyspnea 0-4      Progression   Progression Continue to progress workloads to maintain intensity without signs/symptoms of physical distress.      Resistance Training   Training Prescription Yes    Weight 7 lb    Reps 10-15          Perform Capillary Blood Glucose checks as needed.  Exercise Prescription Changes:   Exercise Prescription Changes     Row Name 03/22/24 1300 04/05/24 0800 04/26/24 0800         Response to Exercise   Blood Pressure (Admit) 148/82 124/62 122/68     Blood Pressure (Exercise) 146/84 170/80 156/82     Blood Pressure (Exit) 136/80 110/66 114/62     Heart Rate (Admit) 75 bpm 72 bpm 70 bpm     Heart Rate (Exercise) 96 bpm 96 bpm 101 bpm     Heart Rate (Exit) 73 bpm 97 bpm 86 bpm     Oxygen Saturation (Admit) 91 %  Room air 95 % 94 %     Oxygen Saturation (Exercise) 87 %  4L O2 88 % 89 %     Oxygen Saturation (Exit) 96 %  4L O2 90 % 97 %     Rating of Perceived Exertion (Exercise) 9 15 12      Perceived Dyspnea (Exercise) 1 3 3      Symptoms none none none     Comments results first 2 weeks  of exercise --     Duration -- Progress to 30 minutes of  aerobic without signs/symptoms of physical distress Progress to 30 minutes of  aerobic without signs/symptoms of physical distress     Intensity -- THRR unchanged THRR unchanged       Progression   Progression -- Continue to progress workloads to maintain intensity without signs/symptoms of physical distress. Continue to progress workloads to maintain intensity without signs/symptoms of physical distress.     Average METs 2.61 2.75 3.5       Resistance Training   Training Prescription -- Yes Yes     Weight -- 7lb 7lb     Reps -- 10-15 10-15       Interval Training   Interval Training -- No No       Oxygen   Oxygen -- Continuous Continuous     Liters -- 4 4       Treadmill   MPH -- 1.6 2.4     Grade -- 0 0     Minutes -- 15 15     METs -- 2.23 2.84       NuStep   Level -- 4  T6 4     Minutes -- 15 15     METs -- 2.3 3.1       REL-XR   Level -- 2 6     Minutes -- 15 15     METs -- 3.8 5       Rower   Level -- 5 10     Watts -- 15 25     Minutes -- 15 15     METs -- 4.19 4.6       Oxygen   Maintain Oxygen Saturation -- 88% or higher 88% or higher  Exercise Comments:   Exercise Comments     Row Name 03/28/24 1108           Exercise Comments First full day of exercise!  Patient was oriented to gym and equipment including functions, settings, policies, and procedures.  Patient's individual exercise prescription and treatment plan were reviewed.  All starting workloads were established based on the results of the 6 minute walk test done at initial orientation visit.  The plan for exercise progression was also introduced and progression will be customized based on patient's performance and goals.          Exercise Goals and Review:   Exercise Goals     Row Name 03/22/24 1309             Exercise Goals   Increase Physical Activity Yes       Intervention Provide advice, education, support and  counseling about physical activity/exercise needs.;Develop an individualized exercise prescription for aerobic and resistive training based on initial evaluation findings, risk stratification, comorbidities and participant's personal goals.       Expected Outcomes Short Term: Attend rehab on a regular basis to increase amount of physical activity.;Long Term: Add in home exercise to make exercise part of routine and to increase amount of physical activity.;Long Term: Exercising regularly at least 3-5 days a week.       Increase Strength and Stamina Yes       Intervention Provide advice, education, support and counseling about physical activity/exercise needs.;Develop an individualized exercise prescription for aerobic and resistive training based on initial evaluation findings, risk stratification, comorbidities and participant's personal goals.       Expected Outcomes Short Term: Increase workloads from initial exercise prescription for resistance, speed, and METs.;Short Term: Perform resistance training exercises routinely during rehab and add in resistance training at home;Long Term: Improve cardiorespiratory fitness, muscular endurance and strength as measured by increased METs and functional capacity ( )       Able to understand and use rate of perceived exertion (RPE) scale Yes       Intervention Provide education and explanation on how to use RPE scale       Expected Outcomes Short Term: Able to use RPE daily in rehab to express subjective intensity level;Long Term:  Able to use RPE to guide intensity level when exercising independently       Able to understand and use Dyspnea scale Yes       Intervention Provide education and explanation on how to use Dyspnea scale       Expected Outcomes Long Term: Able to use Dyspnea scale to guide intensity level when exercising independently;Short Term: Able to use Dyspnea scale daily in rehab to express subjective sense of shortness of breath during exertion        Knowledge and understanding of Target Heart Rate Siple (THRR) Yes       Intervention Provide education and explanation of THRR including how the numbers were predicted and where they are located for reference       Expected Outcomes Short Term: Able to state/look up THRR;Short Term: Able to use daily as guideline for intensity in rehab;Long Term: Able to use THRR to govern intensity when exercising independently       Able to check pulse independently Yes       Intervention Review the importance of being able to check your own pulse for safety during independent exercise;Provide education and demonstration on how to check pulse in carotid and radial  arteries.       Expected Outcomes Short Term: Able to explain why pulse checking is important during independent exercise;Long Term: Able to check pulse independently and accurately       Understanding of Exercise Prescription Yes       Intervention Provide education, explanation, and written materials on patient's individual exercise prescription       Expected Outcomes Short Term: Able to explain program exercise prescription;Long Term: Able to explain home exercise prescription to exercise independently          Exercise Goals Re-Evaluation :  Exercise Goals Re-Evaluation     Row Name 03/28/24 1108 04/05/24 0821 04/26/24 0854         Exercise Goal Re-Evaluation   Exercise Goals Review Able to understand and use rate of perceived exertion (RPE) scale;Knowledge and understanding of Target Heart Rate [place] (THRR);Understanding of Exercise Prescription;Increase Physical Activity;Increase Strength and Stamina;Able to understand and use Dyspnea scale;Able to check pulse independently Increase Physical Activity;Increase Strength and Stamina;Understanding of Exercise Prescription Increase Physical Activity;Increase Strength and Stamina;Understanding of Exercise Prescription     Comments Reviewed RPE and dyspnea scale, THR and program prescription  with pt today.  Pt voiced understanding and was given a copy of goals to take home. Lorrene is off to a great start in the program, and was able to attend his first 3 sessions during this review period. During these sessions he was able to use the treadmill at a workload of 1. and 0% incline. He was also able to use the T6 nustep at level 4 and the rowing machine at level 5. We will continue to monitor his progress in the program. Lorrene is doing well in rehab. He was abel to increase to level 10 on the rower with an average watt of 25 and increase to level 6 on the XR. He also increased his speed on the treadmill to 2.4 mph with no incline. He maintained level 4 on the T4 nustep. We will continue to monitor his progress in the program.     Expected Outcomes Short: Use RPE daily to regulate intensity. Long: Follow program prescription in THR. Short: Continue to follow exercise prescription. Long: Continue exercise to improve strength and stamina. Short: Continue to progressively increase treadmill workload. Long: Continue exercise to improve strength and stamina.        Discharge Exercise Prescription (Final Exercise Prescription Changes):  Exercise Prescription Changes - 04/26/24 0800       Response to Exercise   Blood Pressure (Admit) 122/68    Blood Pressure (Exercise) 156/82    Blood Pressure (Exit) 114/62    Heart Rate (Admit) 70 bpm    Heart Rate (Exercise) 101 bpm    Heart Rate (Exit) 86 bpm    Oxygen Saturation (Admit) 94 %    Oxygen Saturation (Exercise) 89 %    Oxygen Saturation (Exit) 97 %    Rating of Perceived Exertion (Exercise) 12    Perceived Dyspnea (Exercise) 3    Symptoms none    Duration Progress to 30 minutes of  aerobic without signs/symptoms of physical distress    Intensity THRR unchanged      Progression   Progression Continue to progress workloads to maintain intensity without signs/symptoms of physical distress.    Average METs 3.5      Resistance Training    Training Prescription Yes    Weight 7lb    Reps 10-15      Interval Training   Interval Training  No      Oxygen   Oxygen Continuous    Liters 4      Treadmill   MPH 2.4    Grade 0    Minutes 15    METs 2.84      NuStep   Level 4    Minutes 15    METs 3.1      REL-XR   Level 6    Minutes 15    METs 5      Rower   Level 10    Watts 25    Minutes 15    METs 4.6      Oxygen   Maintain Oxygen Saturation 88% or higher          Nutrition:  Target Goals: Understanding of nutrition guidelines, daily intake of sodium 1500mg , cholesterol 200mg , calories 30% from fat and 7% or less from saturated fats, daily to have 5 or more servings of fruits and vegetables.  Education: Nutrition 1 -Group instruction provided by verbal, written material, interactive activities, discussions, models, and posters to present general guidelines for heart healthy nutrition including macronutrients, label reading, and promoting whole foods over processed counterparts. Education serves as pensions consultant of discussion of heart healthy eating for all. Written material provided at class time.     Education: Nutrition 2 -Group instruction provided by verbal, written material, interactive activities, discussions, models, and posters to present general guidelines for heart healthy nutrition including sodium, cholesterol, and saturated fat. Providing guidance of habit forming to improve blood pressure, cholesterol, and body weight. Written material provided at class time.     Biometrics:  Pre Biometrics - 03/22/24 1309       Pre Biometrics   Height 5' 10.6 (1.793 m)    Weight 178 lb 8 oz (81 kg)    Waist Circumference 39.3 inches    Hip Circumference 43.8 inches    Waist to Hip Ratio 0.9 %    BMI (Calculated) 25.19    Single Leg Stand 2.7 seconds           Nutrition Therapy Plan and Nutrition Goals:  Nutrition Therapy & Goals - 03/22/24 1312       Personal Nutrition Goals    Nutrition Goal Will schedule RD appointment in a few weeks after talking with his wife.      Intervention Plan   Intervention Prescribe, educate and counsel regarding individualized specific dietary modifications aiming towards targeted core components such as weight, hypertension, lipid management, diabetes, heart failure and other comorbidities.    Expected Outcomes Short Term Goal: Understand basic principles of dietary content, such as calories, fat, sodium, cholesterol and nutrients.          Nutrition Assessments:  MEDIFICTS Score Key: >=70 Need to make dietary changes  40-70 Heart Healthy Diet <= 40 Therapeutic Level Cholesterol Diet  Flowsheet Row Pulmonary Rehab from 03/22/2024 in [facility] Cardiac and Pulmonary Rehab  Picture Your Plate Total Score on Admission 63   Picture Your Plate Scores: <59 Unhealthy dietary pattern with much room for improvement. 41-50 Dietary pattern unlikely to meet recommendations for good health and room for improvement. 51-60 More healthful dietary pattern, with some room for improvement.  >60 Healthy dietary pattern, although there may be some specific behaviors that could be improved.   Nutrition Goals Re-Evaluation:   Nutrition Goals Discharge (Final Nutrition Goals Re-Evaluation):   Psychosocial: Target Goals: Acknowledge presence or absence of significant depression and/or stress, maximize coping skills, provide positive support system. Participant  is able to verbalize types and ability to use techniques and skills needed for reducing stress and depression.   Education: Stress, Anxiety, and Depression - Group verbal and visual presentation to define topics covered.  Reviews how body is impacted by stress, anxiety, and depression.  Also discusses healthy ways to reduce stress and to treat/manage anxiety and depression.  Written material provided at class time.   Education: Sleep Hygiene -Provides group verbal and written instruction about  how sleep can affect your health.  Define sleep hygiene, discuss sleep cycles and impact of sleep habits. Review good sleep hygiene tips.    Initial Review & Psychosocial Screening:  Initial Psych Review & Screening - 03/15/24 1345       Initial Review   Current issues with None Identified      Family Dynamics   Good Support System? Yes      Barriers   Psychosocial barriers to participate in program There are no identifiable barriers or psychosocial needs.      Screening Interventions   Interventions Encouraged to exercise;Provide feedback about the scores to participant    Expected Outcomes Short Term goal: Utilizing psychosocial counselor, staff and physician to assist with identification of specific Stressors or current issues interfering with healing process. Setting desired goal for each stressor or current issue identified.;Long Term Goal: Stressors or current issues are controlled or eliminated.;Short Term goal: Identification and review with participant of any Quality of Life or Depression concerns found by scoring the questionnaire.;Long Term goal: The participant improves quality of Life and PHQ9 Scores as seen by post scores and/or verbalization of changes          Quality of Life Scores:  Scores of 19 and below usually indicate a poorer quality of life in these areas.  A difference of  2-3 points is a clinically meaningful difference.  A difference of 2-3 points in the total score of the Quality of Life Index has been associated with significant improvement in overall quality of life, self-image, physical symptoms, and general health in studies assessing change in quality of life.  PHQ-9: Review Flowsheet       04/11/2024 03/22/2024 11/30/2023  Depression screen PHQ 2/9  Decreased Interest 0 0 0  Down, Depressed, Hopeless 0 0 0  PHQ - 2 Score 0 0 0  Altered sleeping 0 0 0  Tired, decreased energy 0 0 0  Change in appetite 0 0 0  Feeling bad or failure about yourself   0 0 0  Trouble concentrating 0 0 0  Moving slowly or fidgety/restless 0 0 0  Suicidal thoughts 0 0 0  PHQ-9 Score 0 0 0   Difficult doing work/chores Not difficult at all Not difficult at all Not difficult at all    Details       Data saved with a previous flowsheet row definition        Interpretation of Total Score  Total Score Depression Severity:  1-4 = Minimal depression, 5-9 = Mild depression, 10-14 = Moderate depression, 15-19 = Moderately severe depression, 20-27 = Severe depression   Psychosocial Evaluation and Intervention:  Psychosocial Evaluation - 03/15/24 1401       Psychosocial Evaluation & Interventions   Interventions Encouraged to exercise with the program and follow exercise prescription    Comments Mr. Petersheim is coming to pulmonary rehab with emphysema. He states he has no current stress concerns. His wife is his main support system. He has been wearing his oxygen more consistently  and notes a difference in his symptoms. His doctor was pleased at his last visit with how his medication is working. He is looking forward to attending the program to work on his stamina.    Expected Outcomes Short: attend pulmonary rehab for education and exercise Long: develop and maintain positive self care habits    Continue Psychosocial Services  Follow up required by staff          Psychosocial Re-Evaluation:   Psychosocial Discharge (Final Psychosocial Re-Evaluation):   Education: Education Goals: Education classes will be provided on a weekly basis, covering required topics. Participant will state understanding/return demonstration of topics presented.  Learning Barriers/Preferences:  Learning Barriers/Preferences - 03/15/24 1346       Learning Barriers/Preferences   Learning Barriers None    Learning Preferences Individual Instruction          General Pulmonary Education Topics:  Infection Prevention: - Provides verbal and written material to individual  with discussion of infection control including proper hand washing and proper equipment cleaning during exercise session. Flowsheet Row Pulmonary Rehab from 04/06/2024 in [facility] Cardiac and Pulmonary Rehab  Date 03/22/24  Educator MB  Instruction Review Code 1- Verbalizes Understanding    Falls Prevention: - Provides verbal and written material to individual with discussion of falls prevention and safety. Flowsheet Row Pulmonary Rehab from 04/06/2024 in [facility] Cardiac and Pulmonary Rehab  Date 03/22/24  Educator MB  Instruction Review Code 1- Verbalizes Understanding    Chronic Lung Disease Review: - Group verbal instruction with posters, models, PowerPoint presentations and videos,  to review new updates, new respiratory medications, new advancements in procedures and treatments. Providing information on websites and 800 numbers for continued self-education. Includes information about supplement oxygen, available portable oxygen systems, continuous and intermittent flow rates, oxygen safety, concentrators, and Medicare reimbursement for oxygen. Explanation of Pulmonary Drugs, including class, frequency, complications, importance of spacers, rinsing mouth after steroid MDI's, and proper cleaning methods for nebulizers. Review of basic lung anatomy and physiology related to function, structure, and complications of lung disease. Review of risk factors. Discussion about methods for diagnosing sleep apnea and types of masks and machines for OSA. Includes a review of the use of types of environmental controls: home humidity, furnaces, filters, dust mite/pet prevention, HEPA vacuums. Discussion about weather changes, air quality and the benefits of nasal washing. Instruction on Warning signs, infection symptoms, calling MD promptly, preventive modes, and value of vaccinations. Review of effective airway clearance, coughing and/or vibration techniques. Emphasizing that all should Create an Action Plan.  Written material provided at class time. Flowsheet Row Pulmonary Rehab from 04/06/2024 in [facility] Cardiac and Pulmonary Rehab  Education need identified 03/22/24    AED/CPR: - Group verbal and written instruction with the use of models to demonstrate the basic use of the AED with the basic ABC's of resuscitation.    Tests and Procedures:  - Group verbal and visual presentation and models provide information about basic cardiac anatomy and function. Reviews the testing methods done to diagnose heart disease and the outcomes of the test results. Describes the treatment choices: Medical Management, Angioplasty, or Coronary Bypass Surgery for treating various heart conditions including Myocardial Infarction, Angina, Valve Disease, and Cardiac Arrhythmias.  Written material provided at class time.   Medication Safety: - Group verbal and visual instruction to review commonly prescribed medications for heart and lung disease. Reviews the medication, class of the drug, and side effects. Includes the steps to properly store meds and maintain the prescription  regimen.  Written material given at graduation.   Other: -Provides group and verbal instruction on various topics (see comments)   Knowledge Questionnaire Score:  Knowledge Questionnaire Score - 03/22/24 1313       Knowledge Questionnaire Score   Pre Score 17/18           Core Components/Risk Factors/Patient Goals at Admission:  Personal Goals and Risk Factors at Admission - 03/22/24 1313       Core Components/Risk Factors/Patient Goals on Admission    Weight Management Yes;Weight Maintenance;Weight Loss    Intervention Weight Management: Develop a combined nutrition and exercise program designed to reach desired caloric intake, while maintaining appropriate intake of nutrient and fiber, sodium and fats, and appropriate energy expenditure required for the weight goal.;Weight Management: Provide education and appropriate resources to  help participant work on and attain dietary goals.;Weight Management/Obesity: Establish reasonable short term and long term weight goals.    Admit Weight 178 lb 8 oz (81 kg)    Goal Weight: Short Term 173 lb (78.5 kg)    Goal Weight: Long Term 173 lb (78.5 kg)    Expected Outcomes Short Term: Continue to assess and modify interventions until short term weight is achieved;Long Term: Adherence to nutrition and physical activity/exercise program aimed toward attainment of established weight goal;Understanding recommendations for meals to include 15-35% energy as protein, 25-35% energy from fat, 35-60% energy from carbohydrates, less than 200mg  of dietary cholesterol, 20-35 gm of total fiber daily;Understanding of distribution of calorie intake throughout the day with the consumption of 4-5 meals/snacks;Weight Maintenance: Understanding of the daily nutrition guidelines, which includes 25-35% calories from fat, 7% or less cal from saturated fats, less than 200mg  cholesterol, less than 1.5gm of sodium, & 5 or more servings of fruits and vegetables daily    Hypertension Yes    Intervention Provide education on lifestyle modifcations including regular physical activity/exercise, weight management, moderate sodium restriction and increased consumption of fresh fruit, vegetables, and low fat dairy, alcohol moderation, and smoking cessation.;Monitor prescription use compliance.    Expected Outcomes Short Term: Continued assessment and intervention until BP is < 140/80mm HG in hypertensive participants. < 130/2mm HG in hypertensive participants with diabetes, heart failure or chronic kidney disease.;Long Term: Maintenance of blood pressure at goal levels.    Lipids Yes    Intervention Provide education and support for participant on nutrition & aerobic/resistive exercise along with prescribed medications to achieve LDL 70mg , HDL >40mg .    Expected Outcomes Short Term: Participant states understanding of desired  cholesterol values and is compliant with medications prescribed. Participant is following exercise prescription and nutrition guidelines.;Long Term: Cholesterol controlled with medications as prescribed, with individualized exercise RX and with personalized nutrition plan. Value goals: LDL < 70mg , HDL > 40 mg.          Education:Diabetes - Individual verbal and written instruction to review signs/symptoms of diabetes, desired ranges of glucose level fasting, after meals and with exercise. Acknowledge that pre and post exercise glucose checks will be done for 3 sessions at entry of program.   Know Your Numbers and Heart Failure: - Group verbal and visual instruction to discuss disease risk factors for cardiac and pulmonary disease and treatment options.  Reviews associated critical values for Overweight/Obesity, Hypertension, Cholesterol, and Diabetes.  Discusses basics of heart failure: signs/symptoms and treatments.  Introduces Heart Failure Zone chart for action plan for heart failure. Written material provided at class time.   Core Components/Risk Factors/Patient Goals Review:  Core Components/Risk Factors/Patient Goals at Discharge (Final Review):    ITP Comments:  ITP Comments     Row Name 03/15/24 1359 03/22/24 1257 03/28/24 1108 04/12/24 0933 05/10/24 1125   ITP Comments Initial phone call completed. Diagnosis can be found in [facility] 9/15. EP Orientation scheduled for Wednesday 11/26 at 10:30am. Completed and gym orientation for respiratory care services. Initial ITP created and sent for review to Dr. Fuad Aleskerov, Medical Director. First full day of exercise!  Patient was oriented to gym and equipment including functions, settings, policies, and procedures.  Patient's individual exercise prescription and treatment plan were reviewed.  All starting workloads were established based on the results of the 6 minute walk test done at initial orientation visit.  The plan for exercise  progression was also introduced and progression will be customized based on patient's performance and goals. 30 Day review completed. Medical Director ITP review done, changes made as directed, and signed approval by Medical Director. New to program. 30 Day review completed. Medical Director ITP review done, changes made as directed, and signed approval by Medical Director.      Comments: 30 Day Review      [1]  Current Outpatient Medications:    albuterol  (VENTOLIN  HFA) 108 (90 Base) MCG/ACT inhaler, Inhale 2 puffs into the lungs every 6 (six) hours as needed for wheezing or shortness of breath., Disp: 8 g, Rfl: 5   amLODipine  (NORVASC ) 2.5 MG tablet, TAKE ONE TABLET BY MOUTH AT BEDTIME, Disp: 90 tablet, Rfl: 0   aspirin  EC 81 MG EC tablet, Take 1 tablet (81 mg total) by mouth daily. Swallow whole., Disp: 30 tablet, Rfl: 11   atorvastatin  (LIPITOR) 40 MG tablet, Take 1 tablet (40 mg total) by mouth at bedtime., Disp: 90 tablet, Rfl: 3   clobetasol  (TEMOVATE ) 0.05 % external solution, Apply 1 Application topically 2 (two) times daily., Disp: 50 mL, Rfl: 0   clobetasol  ointment (TEMOVATE ) 0.05 %, Apply 1 Application topically 2 (two) times daily as needed (Skin problems)., Disp: , Rfl:    fluticasone  (FLONASE ) 50 MCG/ACT nasal spray, Place 2 sprays into both nostrils daily., Disp: , Rfl: 2   ipratropium (ATROVENT) 0.03 % nasal spray, Place 2 sprays into the nose., Disp: , Rfl:    sildenafil (REVATIO) 20 MG tablet, Take 20 mg by mouth daily as needed (ED)., Disp: , Rfl:    TRELEGY ELLIPTA  100-62.5-25 MCG/ACT AEPB, Inhale 1 puff into the lungs daily., Disp: 60 each, Rfl: 6   TRELEGY ELLIPTA  200-62.5-25 MCG/ACT AEPB, Inhale 1 puff into the lungs daily., Disp: , Rfl:  [2]  Social History Tobacco Use  Smoking Status Former   Current packs/day: 0.00   Average packs/day: 1 pack/day for 50.0 years (50.0 ttl pk-yrs)   Types: Cigarettes   Start date: 02/09/1971   Quit date: 02/08/2021   Years  since quitting: 3.2  Smokeless Tobacco Never

## 2024-05-11 ENCOUNTER — Encounter: Admitting: Emergency Medicine

## 2024-05-11 DIAGNOSIS — J432 Centrilobular emphysema: Secondary | ICD-10-CM

## 2024-05-11 NOTE — Progress Notes (Signed)
 Daily Session Note  Patient Details  Name: Jordan Barber MRN: [id number] Date of Birth: 06-01-1945 Referring Provider:   Flowsheet Row Pulmonary Rehab from 03/22/2024 in [facility] Cardiac and Pulmonary Rehab  Referring Provider Parris Manna, MD    Encounter Date: 05/11/2024  Check In:  Session Check In - 05/11/24 1118       Check-In   Supervising physician immediately available to respond to emergencies See telemetry face sheet for immediately available ER MD    Location [facility]-Cardiac & Pulmonary Rehab    Staff Present Leita Franks RN,BSN;Maxon Conetta BS, Exercise Physiologist;Joseph Rolinda RCP,RRT,BSRT;Jason Elnor RDN,LDN    Virtual Visit No    Medication changes reported     No    Fall or balance concerns reported    No    Warm-up and Cool-down Performed on first and last piece of equipment    Resistance Training Performed Yes    VAD Patient? No    PAD/SET Patient? No      Pain Assessment   Currently in Pain? No/denies             Tobacco Use History[1]  Goals Met:  Proper associated with RPD/PD & O2 Sat Independence with exercise equipment Using PLB without cueing & demonstrates good technique Exercise tolerated well No report of concerns or symptoms today Strength training completed today  Goals Unmet:  Not Applicable  Comments: Pt able to follow exercise prescription today without complaint.  Will continue to monitor for progression.    Dr. Oneil Pinal is Medical Director for [facility] Cardiac Rehabilitation.  Dr. Fuad Aleskerov is Medical Director for [facility] Pulmonary Rehabilitation.    [1]  Social History Tobacco Use  Smoking Status Former   Current packs/day: 0.00   Average packs/day: 1 pack/day for 50.0 years (50.0 ttl pk-yrs)   Types: Cigarettes   Start date: 02/09/1971   Quit date: 02/08/2021   Years since quitting: 3.2  Smokeless Tobacco Never

## 2024-05-12 ENCOUNTER — Ambulatory Visit: Admitting: Family Medicine

## 2024-05-12 ENCOUNTER — Encounter

## 2024-05-12 ENCOUNTER — Encounter: Payer: Self-pay | Admitting: Family Medicine

## 2024-05-12 VITALS — BP 143/82 | HR 85 | Temp 97.9°F | Ht 70.6 in | Wt 180.6 lb

## 2024-05-12 DIAGNOSIS — I270 Primary pulmonary hypertension: Secondary | ICD-10-CM

## 2024-05-12 DIAGNOSIS — J432 Centrilobular emphysema: Secondary | ICD-10-CM | POA: Diagnosis not present

## 2024-05-12 DIAGNOSIS — G459 Transient cerebral ischemic attack, unspecified: Secondary | ICD-10-CM

## 2024-05-12 DIAGNOSIS — I700 Atherosclerosis of aorta: Secondary | ICD-10-CM

## 2024-05-12 DIAGNOSIS — I10 Essential (primary) hypertension: Secondary | ICD-10-CM

## 2024-05-12 DIAGNOSIS — Z23 Encounter for immunization: Secondary | ICD-10-CM

## 2024-05-12 DIAGNOSIS — Z1159 Encounter for screening for other viral diseases: Secondary | ICD-10-CM

## 2024-05-12 DIAGNOSIS — E871 Hypo-osmolality and hyponatremia: Secondary | ICD-10-CM

## 2024-05-12 DIAGNOSIS — Z8673 Personal history of transient ischemic attack (TIA), and cerebral infarction without residual deficits: Secondary | ICD-10-CM

## 2024-05-12 DIAGNOSIS — N35014 Post-traumatic urethral stricture, male, unspecified: Secondary | ICD-10-CM | POA: Insufficient documentation

## 2024-05-12 DIAGNOSIS — I447 Left bundle-branch block, unspecified: Secondary | ICD-10-CM

## 2024-05-12 DIAGNOSIS — E7800 Pure hypercholesterolemia, unspecified: Secondary | ICD-10-CM

## 2024-05-12 DIAGNOSIS — E538 Deficiency of other specified B group vitamins: Secondary | ICD-10-CM

## 2024-05-12 NOTE — Progress Notes (Signed)
 Daily Session Note  Patient Details  Name: Jordan Barber MRN: [id number] Date of Birth: 04-23-1946 Referring Provider:   Flowsheet Row Pulmonary Rehab from 03/22/2024 in [facility] Cardiac and Pulmonary Rehab  Referring Provider Parris Manna, MD    Encounter Date: 05/12/2024  Check In:  Session Check In - 05/12/24 1058       Check-In   Supervising physician immediately available to respond to emergencies See telemetry face sheet for immediately available ER MD    Location [facility]-Cardiac & Pulmonary Rehab    Staff Present Burnard Davenport RN,BSN,MPA;Joseph Hood RCP,RRT,BSRT;Maxon Conetta BS, Exercise Physiologist;Noah Tickle, BS, Exercise Physiologist    Virtual Visit No    Medication changes reported     No    Fall or balance concerns reported    No    Warm-up and Cool-down Performed on first and last piece of equipment    Resistance Training Performed Yes    VAD Patient? No    PAD/SET Patient? No      Pain Assessment   Currently in Pain? No/denies             Tobacco Use History[1]  Goals Met:  Proper associated with RPD/PD & O2 Sat Independence with exercise equipment Using PLB without cueing & demonstrates good technique Exercise tolerated well No report of concerns or symptoms today Strength training completed today  Goals Unmet:  Not Applicable  Comments: Pt able to follow exercise prescription today without complaint.  Will continue to monitor for progression.    Dr. Oneil Pinal is Medical Director for [facility] Cardiac Rehabilitation.  Dr. Fuad Aleskerov is Medical Director for [facility] Pulmonary Rehabilitation.    [1]  Social History Tobacco Use  Smoking Status Former   Current packs/day: 0.00   Average packs/day: 1 pack/day for 50.0 years (50.0 ttl pk-yrs)   Types: Cigarettes   Start date: 02/09/1971   Quit date: 02/08/2021   Years since quitting: 3.2  Smokeless Tobacco Never

## 2024-05-12 NOTE — Progress Notes (Unsigned)
 "     Established patient visit   Patient: Jordan Barber   DOB: 08-01-1945   79 y.o. Male  MRN: [id number] Visit Date: 05/12/2024  Today's healthcare provider: LAURAINE LOISE BUOY, DO   Chief Complaint  Patient presents with   Medical Management of Chronic Issues    Patient is here today for a 3 month follow up on chronic management, states that he brought in a list of his immunizations last Friday.    States he thinks he already had a pneumonia vaccine.   Subjective    HPI    ***  {History (Optional):23778}  Medications: Show/hide medication list[1]  Review of Systems  HENT:  Negative for congestion, sinus pressure and sinus pain.   Respiratory:  Negative for cough and shortness of breath.   Cardiovascular:  Negative for chest pain.   ***  {Insert previous labs (optional):23779} {See past labs  Heme  Chem  Endocrine  Serology  Results Review (optional):1}   Objective    BP (!) 143/82 (BP Location: Right Arm, Patient Position: Sitting, Cuff Size: Normal)   Pulse 85   Temp 97.9 F (36.6 C) (Oral)   Ht 5' 10.6 (1.793 m)   Wt 180 lb 9.6 oz (81.9 kg)   SpO2 93%   BMI 25.47 kg/m  {Insert last BP/Wt (optional):23777}{See vitals history (optional):1}   Physical Exam Vitals reviewed.  Constitutional:      General: He is not in acute distress.    Appearance: Normal appearance. He is not diaphoretic.  HENT:     Head: Normocephalic and atraumatic.  Eyes:     General: No scleral icterus.    Conjunctiva/sclera: Conjunctivae normal.  Cardiovascular:     Rate and Rhythm: Normal rate and regular rhythm.     Pulses: Normal pulses.     Heart sounds: Normal heart sounds. No murmur heard. Pulmonary:     Effort: Pulmonary effort is normal. No respiratory distress.     Breath sounds: Normal breath sounds. No wheezing or rhonchi.  Musculoskeletal:     Cervical back: Neck supple.     Right lower leg: No edema.     Left lower leg: No edema.  Lymphadenopathy:      Cervical: No cervical adenopathy.  Skin:    General: Skin is warm and dry.     Findings: No rash.  Neurological:     Mental Status: He is alert and oriented to person, place, and time. Mental status is at baseline.  Psychiatric:        Mood and Affect: Mood normal.        Behavior: Behavior normal.      No results found for any visits on 05/12/24.  Assessment & Plan    Pulmonary hypertension, primary (HCC)  Centrilobular emphysema (HCC)  History of transient ischemic attack  Left bundle branch block (LBBB)  Essential hypertension Assessment & Plan: Chronic, mildly elevated in clinic today. 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      I discussed  the assessment and treatment plan with the patient  The patient was provided an opportunity to ask questions and all were answered. The patient agreed with the plan and demonstrated an understanding of the instructions.   The patient was advised to call back or seek an in-person evaluation if the symptoms worsen or if the condition fails to improve as anticipated.    LAURAINE LOISE BUOY, DO  [facility] [phone] (phone) [phone] (fax)  [facility]    [1]  Outpatient Medications Prior to Visit  Medication Sig   albuterol  (VENTOLIN  HFA) 108 (90 Base) MCG/ACT inhaler Inhale 2 puffs into the lungs every 6 (six) hours as needed for wheezing or shortness of breath.   amLODipine  (NORVASC ) 2.5 MG tablet TAKE ONE TABLET BY MOUTH AT BEDTIME    aspirin  EC 81 MG EC tablet Take 1 tablet (81 mg total) by mouth daily. Swallow whole.   atorvastatin  (LIPITOR) 40 MG tablet Take 1 tablet (40 mg total) by mouth at bedtime.   clobetasol  (TEMOVATE ) 0.05 % external solution Apply 1 Application topically 2 (two) times daily.   clobetasol  ointment (TEMOVATE ) 0.05 % Apply 1 Application topically 2 (two) times daily as needed (Skin problems).   fluticasone  (FLONASE ) 50 MCG/ACT nasal spray Place 2 sprays into both nostrils daily.   ipratropium (ATROVENT) 0.03 % nasal spray Place 2 sprays into the nose.   sildenafil (REVATIO) 20 MG tablet Take 20 mg by mouth daily as needed (ED).   TRELEGY ELLIPTA  100-62.5-25 MCG/ACT AEPB Inhale 1 puff into the lungs daily.   TRELEGY ELLIPTA  200-62.5-25 MCG/ACT AEPB Inhale 1 puff into the lungs daily.   No facility-administered medications prior to visit.   "

## 2024-05-12 NOTE — Assessment & Plan Note (Signed)
 Chronic, mildly elevated in clinic today. Recent reading at pulmonary rehab all WNL (110s-120s/60s-70s)

## 2024-05-12 NOTE — Patient Instructions (Signed)
 Recommended vaccine to get at pharmacy

## 2024-05-13 LAB — LIPID PANEL WITH LDL/HDL RATIO
Cholesterol, Total: 127 mg/dL (ref 100–199)
HDL: 54 mg/dL
LDL Chol Calc (NIH): 59 mg/dL (ref 0–99)
LDL/HDL Ratio: 1.1 ratio (ref 0.0–3.6)
Triglycerides: 68 mg/dL (ref 0–149)
VLDL Cholesterol Cal: 14 mg/dL (ref 5–40)

## 2024-05-13 LAB — COMPREHENSIVE METABOLIC PANEL WITH GFR
ALT: 23 IU/L (ref 0–44)
AST: 29 IU/L (ref 0–40)
Albumin: 4.4 g/dL (ref 3.8–4.8)
Alkaline Phosphatase: 83 IU/L (ref 47–123)
BUN/Creatinine Ratio: 14 (ref 10–24)
BUN: 12 mg/dL (ref 8–27)
Bilirubin Total: 1.2 mg/dL (ref 0.0–1.2)
CO2: 21 mmol/L (ref 20–29)
Calcium: 9.6 mg/dL (ref 8.6–10.2)
Chloride: 99 mmol/L (ref 96–106)
Creatinine, Ser: 0.84 mg/dL (ref 0.76–1.27)
Globulin, Total: 2.6 g/dL (ref 1.5–4.5)
Glucose: 99 mg/dL (ref 70–99)
Potassium: 4.2 mmol/L (ref 3.5–5.2)
Sodium: 136 mmol/L (ref 134–144)
Total Protein: 7 g/dL (ref 6.0–8.5)
eGFR: 90 mL/min/1.73

## 2024-05-13 LAB — HCV AB W REFLEX TO QUANT PCR: HCV Ab: NONREACTIVE

## 2024-05-13 LAB — HCV INTERPRETATION

## 2024-05-16 ENCOUNTER — Encounter

## 2024-05-16 DIAGNOSIS — J432 Centrilobular emphysema: Secondary | ICD-10-CM | POA: Diagnosis not present

## 2024-05-16 NOTE — Progress Notes (Signed)
 Daily Session Note  Patient Details  Name: LORRAINE TERRIQUEZ MRN: [id number] Date of Birth: [birth year] Referring Provider:   Flowsheet Row Pulmonary Rehab from 03/22/2024 in [facility] Cardiac and Pulmonary Rehab  Referring Provider Parris Manna, MD    Encounter Date: 05/16/2024  Check In:  Session Check In - 05/16/24 1109       Check-In   Supervising physician immediately available to respond to emergencies See telemetry face sheet for immediately available ER MD    Location [facility]-Cardiac & Pulmonary Rehab    Staff Present Burnard Davenport RN,BSN,MPA;Meredith Tressa RN,BSN;Maxon Conetta BS, Exercise Physiologist;Jason Elnor [facility]    Virtual Visit No    Medication changes reported     No    Fall or balance concerns reported    No    Warm-up and Cool-down Performed on first and last piece of equipment    Resistance Training Performed Yes    VAD Patient? No    PAD/SET Patient? No      Pain Assessment   Currently in Pain? No/denies             Tobacco Use History[1]  Goals Met:  Proper associated with RPD/PD & O2 Sat Independence with exercise equipment Using PLB without cueing & demonstrates good technique Exercise tolerated well No report of concerns or symptoms today Strength training completed today  Goals Unmet:  Not Applicable  Comments: Pt able to follow exercise prescription today without complaint.  Will continue to monitor for progression.    Dr. Oneil Pinal is Medical Director for [facility] Cardiac Rehabilitation.  Dr. Fuad Aleskerov is Medical Director for [facility] Pulmonary Rehabilitation.    [1]  Social History Tobacco Use  Smoking Status Former   Current packs/day: 0.00   Average packs/day: 1 pack/day for 50.0 years (50.0 ttl pk-yrs)   Types: Cigarettes   Start date: 02/09/1971   Quit date: 02/08/2021   Years since quitting: 3.2  Smokeless Tobacco Never

## 2024-05-18 ENCOUNTER — Encounter: Admitting: Emergency Medicine

## 2024-05-18 DIAGNOSIS — J432 Centrilobular emphysema: Secondary | ICD-10-CM

## 2024-05-18 NOTE — Progress Notes (Signed)
 Daily Session Note  Patient Details  Name: Jordan Barber MRN: [id number] Date of Birth: May 01, 1945 Referring Provider:   Flowsheet Row Pulmonary Rehab from 03/22/2024 in [facility] Cardiac and Pulmonary Rehab  Referring Provider Parris Manna, MD    Encounter Date: 05/18/2024  Check In:  Session Check In - 05/18/24 1132       Check-In   Supervising physician immediately available to respond to emergencies See telemetry face sheet for immediately available ER MD    Location [facility]-Cardiac & Pulmonary Rehab    Staff Present Leita Franks RN,BSN;Joseph Rolinda RCP,RRT,BSRT;Jason Elnor RDN,LDN;Noah Tickle, BS, Exercise Physiologist    Virtual Visit No    Medication changes reported     No    Fall or balance concerns reported    No    Warm-up and Cool-down Performed on first and last piece of equipment    Resistance Training Performed Yes    VAD Patient? No    PAD/SET Patient? No      Pain Assessment   Currently in Pain? No/denies             Tobacco Use History[1]  Goals Met:  Proper associated with RPD/PD & O2 Sat Independence with exercise equipment Using PLB without cueing & demonstrates good technique Exercise tolerated well No report of concerns or symptoms today Strength training completed today  Goals Unmet:  Not Applicable  Comments: Pt able to follow exercise prescription today without complaint.  Will continue to monitor for progression.    Dr. Oneil Pinal is Medical Director for [facility] Cardiac Rehabilitation.  Dr. Fuad Aleskerov is Medical Director for [facility] Pulmonary Rehabilitation.    [1]  Social History Tobacco Use  Smoking Status Former   Current packs/day: 0.00   Average packs/day: 1 pack/day for 50.0 years (50.0 ttl pk-yrs)   Types: Cigarettes   Start date: 02/09/1971   Quit date: 02/08/2021   Years since quitting: 3.2  Smokeless Tobacco Never

## 2024-05-19 ENCOUNTER — Encounter

## 2024-05-19 DIAGNOSIS — J432 Centrilobular emphysema: Secondary | ICD-10-CM | POA: Diagnosis not present

## 2024-05-19 NOTE — Progress Notes (Signed)
 Daily Session Note  Patient Details  Name: Jordan Barber MRN: [id number] Date of Birth: 12-04-1945 Referring Provider:   Flowsheet Row Pulmonary Rehab from 03/22/2024 in [facility] Cardiac and Pulmonary Rehab  Referring Provider Parris Manna, MD    Encounter Date: 05/19/2024  Check In:  Session Check In - 05/19/24 1122       Check-In   Supervising physician immediately available to respond to emergencies See telemetry face sheet for immediately available ER MD    Location [facility]-Cardiac & Pulmonary Rehab    Staff Present Burnard Davenport RN,BSN,MPA;Maxon Conetta BS, Exercise Physiologist;Joseph Hood RCP,RRT,BSRT;Noah Tickle, [place], Exercise Physiologist    Virtual Visit No    Medication changes reported     No    Fall or balance concerns reported    No    Warm-up and Cool-down Performed on first and last piece of equipment    Resistance Training Performed Yes    VAD Patient? No    PAD/SET Patient? No      Pain Assessment   Currently in Pain? No/denies             Tobacco Use History[1]  Goals Met:  Proper associated with RPD/PD & O2 Sat Independence with exercise equipment Using PLB without cueing & demonstrates good technique Exercise tolerated well No report of concerns or symptoms today Strength training completed today  Goals Unmet:  Not Applicable  Comments: Pt able to follow exercise prescription today without complaint.  Will continue to monitor for progression.    Dr. Oneil Pinal is Medical Director for [facility] Cardiac Rehabilitation.  Dr. Fuad Aleskerov is Medical Director for [facility] Pulmonary Rehabilitation.    [1]  Social History Tobacco Use  Smoking Status Former   Current packs/day: 0.00   Average packs/day: 1 pack/day for 50.0 years (50.0 ttl pk-yrs)   Types: Cigarettes   Start date: 02/09/1971   Quit date: 02/08/2021   Years since quitting: 3.2  Smokeless Tobacco Never

## 2024-05-22 ENCOUNTER — Other Ambulatory Visit: Admitting: Urology

## 2024-05-23 ENCOUNTER — Encounter: Admitting: Emergency Medicine

## 2024-05-23 DIAGNOSIS — J432 Centrilobular emphysema: Secondary | ICD-10-CM | POA: Diagnosis not present

## 2024-05-23 NOTE — Progress Notes (Signed)
 Daily Session Note  Patient Details  Name: Jordan Barber MRN: [id number] Date of Birth: 1946-04-03 Referring Provider:   Flowsheet Row Pulmonary Rehab from 03/22/2024 in [facility] Cardiac and Pulmonary Rehab  Referring Provider Parris Manna, MD    Encounter Date: 05/23/2024  Check In:  Session Check In - 05/23/24 1049       Check-In   Supervising physician immediately available to respond to emergencies See telemetry face sheet for immediately available ER MD    Location [facility]-Cardiac & Pulmonary Rehab    Staff Present Leita Franks RN,BSN;Maxon Burnell BS, Exercise Physiologist;Margaret Best, MS, Exercise Physiologist;Noah Tickle, BS, Exercise Physiologist    Virtual Visit No    Medication changes reported     No    Fall or balance concerns reported    No    Warm-up and Cool-down Performed on first and last piece of equipment    Resistance Training Performed Yes    VAD Patient? No    PAD/SET Patient? No      Pain Assessment   Currently in Pain? No/denies             Tobacco Use History[1]  Goals Met:  Proper associated with RPD/PD & O2 Sat Independence with exercise equipment Using PLB without cueing & demonstrates good technique Exercise tolerated well No report of concerns or symptoms today Strength training completed today  Goals Unmet:  Not Applicable  Comments: Pt able to follow exercise prescription today without complaint.  Will continue to monitor for progression.    Dr. Oneil Pinal is Medical Director for [facility] Cardiac Rehabilitation.  Dr. Fuad Aleskerov is Medical Director for [facility] Pulmonary Rehabilitation.    [1]  Social History Tobacco Use  Smoking Status Former   Current packs/day: 0.00   Average packs/day: 1 pack/day for 50.0 years (50.0 ttl pk-yrs)   Types: Cigarettes   Start date: 02/09/1971   Quit date: 02/08/2021   Years since quitting: 3.2  Smokeless Tobacco Never

## 2024-05-25 ENCOUNTER — Encounter: Admitting: Emergency Medicine

## 2024-05-25 DIAGNOSIS — J432 Centrilobular emphysema: Secondary | ICD-10-CM | POA: Diagnosis not present

## 2024-05-25 NOTE — Progress Notes (Signed)
 Daily Session Note  Patient Details  Name: Jordan Barber MRN: [id number] Date of Birth: June 06, 1945 Referring Provider:   Flowsheet Row Pulmonary Rehab from 03/22/2024 in [facility] Cardiac and Pulmonary Rehab  Referring Provider Parris Manna, MD    Encounter Date: 05/25/2024  Check In:  Session Check In - 05/25/24 1050       Check-In   Supervising physician immediately available to respond to emergencies See telemetry face sheet for immediately available ER MD    Location [facility]-Cardiac & Pulmonary Rehab    Staff Present Leita Franks RN,BSN;Joseph [facility] BS, Exercise Physiologist;Jason Elnor RDN,LDN    Virtual Visit No    Medication changes reported     No    Fall or balance concerns reported    No    Warm-up and Cool-down Performed on first and last piece of equipment    Resistance Training Performed Yes    VAD Patient? No    PAD/SET Patient? No      Pain Assessment   Currently in Pain? No/denies             Tobacco Use History[1]  Goals Met:  Proper associated with RPD/PD & O2 Sat Independence with exercise equipment Using PLB without cueing & demonstrates good technique Exercise tolerated well No report of concerns or symptoms today Strength training completed today  Goals Unmet:  Not Applicable  Comments: Pt able to follow exercise prescription today without complaint.  Will continue to monitor for progression.    Dr. Oneil Pinal is Medical Director for [facility] Cardiac Rehabilitation.  Dr. Fuad Aleskerov is Medical Director for [facility] Pulmonary Rehabilitation.    [1]  Social History Tobacco Use  Smoking Status Former   Current packs/day: 0.00   Average packs/day: 1 pack/day for 50.0 years (50.0 ttl pk-yrs)   Types: Cigarettes   Start date: 02/09/1971   Quit date: 02/08/2021   Years since quitting: 3.2  Smokeless Tobacco Never

## 2024-05-26 ENCOUNTER — Encounter

## 2024-05-26 DIAGNOSIS — J432 Centrilobular emphysema: Secondary | ICD-10-CM

## 2024-05-26 NOTE — Progress Notes (Signed)
 Daily Session Note  Patient Details  Name: Jordan Barber MRN: [id number] Date of Birth: 1945/07/16 Referring Provider:   Flowsheet Row Pulmonary Rehab from 03/22/2024 in [facility] Cardiac and Pulmonary Rehab  Referring Provider Parris Manna, MD    Encounter Date: 05/26/2024  Check In:  Session Check In - 05/26/24 1106       Check-In   Supervising physician immediately available to respond to emergencies See telemetry face sheet for immediately available ER MD    Location [facility]-Cardiac & Pulmonary Rehab    Staff Present Burnard Davenport RN,BSN,MPA;Joseph [facility] RCP,RRT,BSRT;Laura Cates RN,BSN;Noah Tickle, [place], Exercise Physiologist    Virtual Visit No    Medication changes reported     No    Fall or balance concerns reported    No    Warm-up and Cool-down Performed on first and last piece of equipment    Resistance Training Performed Yes    VAD Patient? No    PAD/SET Patient? No      Pain Assessment   Currently in Pain? No/denies             Tobacco Use History[1]  Goals Met:  Independence with exercise equipment Exercise tolerated well No report of concerns or symptoms today Strength training completed today  Goals Unmet:  Not Applicable  Comments: Pt able to follow exercise prescription today without complaint.  Will continue to monitor for progression.    Dr. Oneil Pinal is Medical Director for [facility] Cardiac Rehabilitation.  Dr. Fuad Aleskerov is Medical Director for [facility] Pulmonary Rehabilitation.    [1]  Social History Tobacco Use  Smoking Status Former   Current packs/day: 0.00   Average packs/day: 1 pack/day for 50.0 years (50.0 ttl pk-yrs)   Types: Cigarettes   Start date: 02/09/1971   Quit date: 02/08/2021   Years since quitting: 3.2  Smokeless Tobacco Never

## 2024-05-30 ENCOUNTER — Encounter

## 2024-05-30 DIAGNOSIS — J432 Centrilobular emphysema: Secondary | ICD-10-CM

## 2024-05-30 NOTE — Progress Notes (Signed)
 Daily Session Note  Patient Details  Name: Jordan Barber MRN: [id number] Date of Birth: May 19, 1945 Referring Provider:   Flowsheet Row Pulmonary Rehab from 03/22/2024 in [facility] Cardiac and Pulmonary Rehab  Referring Provider Parris Manna, MD    Encounter Date: 05/30/2024  Check In:  Session Check In - 05/30/24 1057       Check-In   Supervising physician immediately available to respond to emergencies See telemetry face sheet for immediately available ER MD    Location [facility]-Cardiac & Pulmonary Rehab    Staff Present Burnard Davenport RN,BSN,MPA;Maxon Conetta BS, Exercise Physiologist;Margaret Best, MS, Exercise Physiologist;Noah Tickle, BS, Exercise Physiologist;Meredith Tressa RN,BSN    Virtual Visit No    Medication changes reported     No    Fall or balance concerns reported    No    Warm-up and Cool-down Performed on first and last piece of equipment    Resistance Training Performed Yes    VAD Patient? No    PAD/SET Patient? No      Pain Assessment   Currently in Pain? No/denies             Tobacco Use History[1]  Goals Met:  Proper associated with RPD/PD & O2 Sat Independence with exercise equipment Using PLB without cueing & demonstrates good technique Exercise tolerated well No report of concerns or symptoms today Strength training completed today  Goals Unmet:  Not Applicable  Comments: Pt able to follow exercise prescription today without complaint.  Will continue to monitor for progression.    Dr. Oneil Pinal is Medical Director for [facility] Cardiac Rehabilitation.  Dr. Fuad Aleskerov is Medical Director for [facility] Pulmonary Rehabilitation.    [1]  Social History Tobacco Use  Smoking Status Former   Current packs/day: 0.00   Average packs/day: 1 pack/day for 50.0 years (50.0 ttl pk-yrs)   Types: Cigarettes   Start date: 02/09/1971   Quit date: 02/08/2021   Years since quitting: 3.3  Smokeless Tobacco Never

## 2024-06-01 ENCOUNTER — Encounter: Admitting: Emergency Medicine

## 2024-06-01 DIAGNOSIS — J432 Centrilobular emphysema: Secondary | ICD-10-CM

## 2024-06-01 NOTE — Progress Notes (Signed)
 Daily Session Note  Patient Details  Name: Jordan Barber MRN: [id number] Date of Birth: April 27, 1946 Referring Provider:   Flowsheet Row Pulmonary Rehab from 03/22/2024 in [facility] Cardiac and Pulmonary Rehab  Referring Provider Parris Manna, MD    Encounter Date: 06/01/2024  Check In:  Session Check In - 06/01/24 1125       Check-In   Supervising physician immediately available to respond to emergencies See telemetry face sheet for immediately available ER MD    Location [facility]-Cardiac & Pulmonary Rehab    Staff Present Leita Franks RN,BSN;Joseph [facility] BS, Exercise Physiologist;Noah Tickle, BS, Exercise Physiologist    Virtual Visit No    Medication changes reported     No    Fall or balance concerns reported    No    Warm-up and Cool-down Performed on first and last piece of equipment    Resistance Training Performed Yes    VAD Patient? No    PAD/SET Patient? No      Pain Assessment   Currently in Pain? No/denies             Tobacco Use History[1]  Goals Met:  Proper associated with RPD/PD & O2 Sat Independence with exercise equipment Using PLB without cueing & demonstrates good technique Exercise tolerated well No report of concerns or symptoms today Strength training completed today  Goals Unmet:  Not Applicable  Comments: Pt able to follow exercise prescription today without complaint.  Will continue to monitor for progression.    Dr. Oneil Pinal is Medical Director for [facility] Cardiac Rehabilitation.  Dr. Fuad Aleskerov is Medical Director for [facility] Pulmonary Rehabilitation.    [1]  Social History Tobacco Use  Smoking Status Former   Current packs/day: 0.00   Average packs/day: 1 pack/day for 50.0 years (50.0 ttl pk-yrs)   Types: Cigarettes   Start date: 02/09/1971   Quit date: 02/08/2021   Years since quitting: 3.3  Smokeless Tobacco Never

## 2024-06-02 ENCOUNTER — Encounter

## 2024-06-02 DIAGNOSIS — J432 Centrilobular emphysema: Secondary | ICD-10-CM

## 2024-06-02 NOTE — Progress Notes (Signed)
 Daily Session Note  Patient Details  Name: Jordan Barber MRN: [id number] Date of Birth: 1945/12/16 Referring Provider:   Flowsheet Row Pulmonary Rehab from 03/22/2024 in [facility] Cardiac and Pulmonary Rehab  Referring Provider Parris Manna, MD    Encounter Date: 06/02/2024  Check In:  Session Check In - 06/02/24 1109       Check-In   Supervising physician immediately available to respond to emergencies See telemetry face sheet for immediately available ER MD    Location [facility]-Cardiac & Pulmonary Rehab    Staff Present Burnard Davenport RN,BSN,MPA;Maxon Conetta BS, Exercise Physiologist;Joseph Hood RCP,RRT,BSRT;Noah Tickle, [place], Exercise Physiologist    Virtual Visit No    Medication changes reported     No    Fall or balance concerns reported    No    Warm-up and Cool-down Performed on first and last piece of equipment    Resistance Training Performed Yes    VAD Patient? No    PAD/SET Patient? No      Pain Assessment   Currently in Pain? No/denies             Tobacco Use History[1]  Goals Met:  Proper associated with RPD/PD & O2 Sat Independence with exercise equipment Using PLB without cueing & demonstrates good technique Exercise tolerated well No report of concerns or symptoms today Strength training completed today  Goals Unmet:  Not Applicable  Comments: Pt able to follow exercise prescription today without complaint.  Will continue to monitor for progression.    Dr. Oneil Pinal is Medical Director for [facility] Cardiac Rehabilitation.  Dr. Fuad Aleskerov is Medical Director for [facility] Pulmonary Rehabilitation.    [1]  Social History Tobacco Use  Smoking Status Former   Current packs/day: 0.00   Average packs/day: 1 pack/day for 50.0 years (50.0 ttl pk-yrs)   Types: Cigarettes   Start date: 02/09/1971   Quit date: 02/08/2021   Years since quitting: 3.3  Smokeless Tobacco Never

## 2024-06-06 ENCOUNTER — Encounter

## 2024-06-08 ENCOUNTER — Encounter

## 2024-06-09 ENCOUNTER — Encounter

## 2024-06-13 ENCOUNTER — Encounter

## 2024-06-15 ENCOUNTER — Encounter

## 2024-06-16 ENCOUNTER — Encounter

## 2024-06-19 ENCOUNTER — Ambulatory Visit

## 2024-06-20 ENCOUNTER — Encounter

## 2024-06-22 ENCOUNTER — Encounter

## 2024-07-31 ENCOUNTER — Other Ambulatory Visit: Admitting: Urology

## 2024-11-09 ENCOUNTER — Encounter: Admitting: Physician Assistant

## 2025-04-17 ENCOUNTER — Ambulatory Visit
